# Patient Record
Sex: Female | Born: 1995 | Race: Black or African American | Hispanic: No | Marital: Single | Smoking: Former smoker
Health system: Southern US, Community
[De-identification: ages and names within clinical notes are randomized; demographics above are authoritative.]

## PROBLEM LIST (undated history)

## (undated) ENCOUNTER — Inpatient Hospital Stay (HOSPITAL_COMMUNITY): Payer: Self-pay

## (undated) DIAGNOSIS — A749 Chlamydial infection, unspecified: Secondary | ICD-10-CM

## (undated) DIAGNOSIS — R45851 Suicidal ideations: Secondary | ICD-10-CM

## (undated) DIAGNOSIS — N730 Acute parametritis and pelvic cellulitis: Secondary | ICD-10-CM

## (undated) DIAGNOSIS — F902 Attention-deficit hyperactivity disorder, combined type: Secondary | ICD-10-CM

## (undated) HISTORY — PX: NO PAST SURGERIES: SHX2092

---
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
## 2016-03-06 ENCOUNTER — Emergency Department (HOSPITAL_COMMUNITY)
Admission: EM | Admit: 2016-03-06 | Discharge: 2016-03-06 | Disposition: A | Discharge status: Home / Self Care | Payer: Medicaid Other | Attending: Emergency Medicine | Admitting: Emergency Medicine

## 2016-03-06 ENCOUNTER — Encounter (HOSPITAL_COMMUNITY): Payer: Self-pay

## 2016-03-06 DIAGNOSIS — Z3202 Encounter for pregnancy test, result negative: Secondary | ICD-10-CM | POA: Diagnosis not present

## 2016-03-06 DIAGNOSIS — Z8619 Personal history of other infectious and parasitic diseases: Secondary | ICD-10-CM | POA: Diagnosis not present

## 2016-03-06 DIAGNOSIS — F410 Panic disorder [episodic paroxysmal anxiety] without agoraphobia: Secondary | ICD-10-CM | POA: Insufficient documentation

## 2016-03-06 DIAGNOSIS — N390 Urinary tract infection, site not specified: Secondary | ICD-10-CM | POA: Diagnosis not present

## 2016-03-06 DIAGNOSIS — F17210 Nicotine dependence, cigarettes, uncomplicated: Secondary | ICD-10-CM | POA: Insufficient documentation

## 2016-03-06 DIAGNOSIS — F419 Anxiety disorder, unspecified: Secondary | ICD-10-CM

## 2016-03-06 DIAGNOSIS — R55 Syncope and collapse: Secondary | ICD-10-CM | POA: Diagnosis present

## 2016-03-06 LAB — CBC
HEMATOCRIT: 34.3 % — AB (ref 36.0–46.0)
Hemoglobin: 11.7 g/dL — ABNORMAL LOW (ref 12.0–15.0)
MCH: 28.3 pg (ref 26.0–34.0)
MCHC: 34.1 g/dL (ref 30.0–36.0)
MCV: 83.1 fL (ref 78.0–100.0)
Platelets: 190 10*3/uL (ref 150–400)
RBC: 4.13 MIL/uL (ref 3.87–5.11)
RDW: 12.2 % (ref 11.5–15.5)
WBC: 7.2 10*3/uL (ref 4.0–10.5)

## 2016-03-06 LAB — URINE MICROSCOPIC-ADD ON

## 2016-03-06 LAB — URINALYSIS, ROUTINE W REFLEX MICROSCOPIC
Bilirubin Urine: NEGATIVE
Glucose, UA: NEGATIVE mg/dL
Hgb urine dipstick: NEGATIVE
Ketones, ur: NEGATIVE mg/dL
NITRITE: POSITIVE — AB
PH: 7.5 (ref 5.0–8.0)
Protein, ur: NEGATIVE mg/dL
SPECIFIC GRAVITY, URINE: 1.023 (ref 1.005–1.030)

## 2016-03-06 LAB — BASIC METABOLIC PANEL
ANION GAP: 7 (ref 5–15)
BUN: 8 mg/dL (ref 6–20)
CHLORIDE: 107 mmol/L (ref 101–111)
CO2: 25 mmol/L (ref 22–32)
Calcium: 9.5 mg/dL (ref 8.9–10.3)
Creatinine, Ser: 0.84 mg/dL (ref 0.44–1.00)
GFR calc non Af Amer: >60 mL/min (ref >60)
Glucose, Bld: 81 mg/dL (ref 65–99)
POTASSIUM: 3.4 mmol/L — AB (ref 3.5–5.1)
SODIUM: 139 mmol/L (ref 135–145)

## 2016-03-06 LAB — I-STAT BETA HCG BLOOD, ED (MC, WL, AP ONLY): I-stat hCG, quantitative: <5 m[IU]/mL (ref <5)

## 2016-03-06 MED ORDER — CEPHALEXIN 500 MG PO CAPS: 500.0000 mg | ORAL_CAPSULE | Freq: Four times a day (QID) | ORAL | Status: DC

## 2016-03-06 NOTE — ED Notes (Signed)
Pt called to revitalize. Pt did not answer.

## 2016-03-06 NOTE — ED Provider Notes (Signed)
CSN: [id number]     Arrival date & time 03/06/16  1606 History   First MD Initiated Contact with Patient 03/06/16 2004     Chief Complaint  Patient presents with  . Near Syncope     (Consider location/radiation/quality/duration/timing/severity/associated sxs/prior Treatment) HPI Comments: Patient presents for evaluation of brief episode this morning of heart palpitations (felt like heart racing), and "felt faint" without syncope that last less than one minute. No nausea, vomiting, chest pain, fever, cough. She states symptoms were similar to panic attacks she has had in the past. She denies any further symptoms for the rest of her day and that coming in this evening was the soonest she could come in because of her job.   Patient is a 20 y.o. female presenting with near-syncope. The history is provided by the patient. No language interpreter was used.  Near Syncope Pertinent negatives include no chills or fever.    Past Medical History  Diagnosis Date  . Suicidal ideation   . ADHD (attention deficit hyperactivity disorder), combined type 07/14/2012  . Chlamydia    Past Surgical History  Procedure Laterality Date  . No past surgeries     No family history on file. Social History  Substance Use Topics  . Smoking status: Current Every Day Smoker -- 1.00 packs/day for .8 years    Types: Cigarettes    Last Attempt to Quit: 01/07/2011  . Smokeless tobacco: None  . Alcohol Use: No   OB History    Gravida Para Term Preterm AB TAB SAB Ectopic Multiple Living   2 2 1 1      2      Review of Systems  Constitutional: Negative for fever and chills.  HENT: Negative.   Respiratory: Negative.   Cardiovascular: Positive for palpitations and near-syncope.  Gastrointestinal: Negative.   Musculoskeletal: Negative.   Skin: Negative.   Neurological: Negative.       Allergies  Review of patient's allergies indicates no known allergies.  Home Medications   Prior to Admission  medications   Medication Sig Start Date End Date Taking? Authorizing Provider  ibuprofen (ADVIL,MOTRIN) 200 MG tablet Take 400 mg by mouth every 6 (six) hours as needed for headache or mild pain.   Yes Historical Provider, MD  Ibuprofen-Diphenhydramine Cit (ADVIL PM PO) Take 2 tablets by mouth at bedtime as needed (for sleep).   Yes Historical Provider, MD   BP 136/87 mmHg  Pulse 83  Temp(Src) 98.4 F (36.9 C) (Oral)  Resp 12  Wt 61.871 kg  SpO2 100% Physical Exam  Constitutional: She is oriented to person, place, and time. She appears well-developed and well-nourished.  HENT:  Head: Normocephalic.  Neck: Normal range of motion. Neck supple.  Cardiovascular: Normal rate and regular rhythm.   No murmur heard. Pulmonary/Chest: Effort normal and breath sounds normal. She has no wheezes. She has no rales.  Abdominal: Soft. Bowel sounds are normal. There is no tenderness. There is no rebound and no guarding.  Musculoskeletal: Normal range of motion.  Neurological: She is alert and oriented to person, place, and time.  Skin: Skin is warm and dry. No rash noted.  Psychiatric: She has a normal mood and affect.    ED Course  Procedures (including critical care time) Labs Review Labs Reviewed  BASIC METABOLIC PANEL - Abnormal; Notable for the following:    Potassium 3.4 (*)    All other components within normal limits  CBC - Abnormal; Notable for the following:  Hemoglobin 11.7 (*)    HCT 34.3 (*)    All other components within normal limits  URINALYSIS, ROUTINE W REFLEX MICROSCOPIC (NOT AT [facility]) - Abnormal; Notable for the following:    APPearance CLOUDY (*)    Nitrite POSITIVE (*)    Leukocytes, UA MODERATE (*)    All other components within normal limits  URINE MICROSCOPIC-ADD ON - Abnormal; Notable for the following:    Squamous Epithelial / LPF 6-30 (*)    Bacteria, UA MANY (*)    All other components within normal limits  I-STAT BETA HCG BLOOD, ED ([facility], [facility], [facility] ONLY)     Imaging Review No results found. I have personally reviewed and evaluated these images and lab results as part of my medical decision-making.   EKG Interpretation None      MDM   Final diagnoses:  None    1. Panic attack 2. UTI  Patient's lab studies show evidence of UTI. The patient endorses mild dysuria when asked but did not complain initially. Will place on Keflex for UTI.   No further symptoms of palpitations and lightheadedness throughout the day after initial episode this morning which "felt like previous panic attacks." No evidence infection, concern for cardiac involvement or PE. She is felt stable for discharge home. Encouraged return to [facility] for counseling.    Elpidio Anis, PA-C 03/06/16 2105  Rolan Bucco, MD 03/06/16 2219

## 2016-03-06 NOTE — Discharge Instructions (Signed)
Panic Attacks °Panic attacks are sudden, short-lived surges of severe anxiety, fear, or discomfort. They may occur for no reason when you are relaxed, when you are anxious, or when you are sleeping. Panic attacks may occur for a number of reasons:  °· Healthy people occasionally have panic attacks in extreme, life-threatening situations, such as war or natural disasters. Normal anxiety is a protective mechanism of the body that helps us react to danger (fight or flight response). °· Panic attacks are often seen with anxiety disorders, such as panic disorder, social anxiety disorder, generalized anxiety disorder, and phobias. Anxiety disorders cause excessive or uncontrollable anxiety. They may interfere with your relationships or other life activities. °· Panic attacks are sometimes seen with other mental illnesses, such as depression and posttraumatic stress disorder. °· Certain medical conditions, prescription medicines, and drugs of abuse can cause panic attacks. °SYMPTOMS  °Panic attacks start suddenly, peak within 20 minutes, and are accompanied by four or more of the following symptoms: °· Pounding heart or fast heart rate (palpitations). °· Sweating. °· Trembling or shaking. °· Shortness of breath or feeling smothered. °· Feeling choked. °· Chest pain or discomfort. °· Nausea or strange feeling in your stomach. °· Dizziness, light-headedness, or feeling like you will faint. °· Chills or hot flushes. °· Numbness or tingling in your lips or hands and feet. °· Feeling that things are not real or feeling that you are not yourself. °· Fear of losing control or going crazy. °· Fear of dying. °Some of these symptoms can mimic serious medical conditions. For example, you may think you are having a heart attack. Although panic attacks can be very scary, they are not life threatening. °DIAGNOSIS  °Panic attacks are diagnosed through an assessment by your health care provider. Your health care provider will ask  questions about your symptoms, such as where and when they occurred. Your health care provider will also ask about your medical history and use of alcohol and drugs, including prescription medicines. Your health care provider may order blood tests or other studies to rule out a serious medical condition. Your health care provider may refer you to a mental health professional for further evaluation. °TREATMENT  °· Most healthy people who have one or two panic attacks in an extreme, life-threatening situation will not require treatment. °· The treatment for panic attacks associated with anxiety disorders or other mental illness typically involves counseling with a mental health professional, medicine, or a combination of both. Your health care provider will help determine what treatment is best for you. °· Panic attacks due to physical illness usually go away with treatment of the illness. If prescription medicine is causing panic attacks, talk with your health care provider about stopping the medicine, decreasing the dose, or substituting another medicine. °· Panic attacks due to alcohol or drug abuse go away with abstinence. Some adults need professional help in order to stop drinking or using drugs. °HOME CARE INSTRUCTIONS  °· Take all medicines as directed by your health care provider.   °· Schedule and attend follow-up visits as directed by your health care provider. It is important to keep all your appointments. °SEEK MEDICAL CARE IF: °· You are not able to take your medicines as prescribed. °· Your symptoms do not improve or get worse. °SEEK IMMEDIATE MEDICAL CARE IF:  °· You experience panic attack symptoms that are different than your usual symptoms. °· You have serious thoughts about hurting yourself or others. °· You are taking medicine for panic attacks and   have a serious side effect. °MAKE SURE YOU: °· Understand these instructions. °· Will watch your condition. °· Will get help right away if you are not  doing well or get worse. °  °This information is not intended to replace advice given to you by your health care provider. Make sure you discuss any questions you have with your health care provider. °  °Document Released: 09/17/2005 Document Revised: 09/22/2013 Document Reviewed: 05/01/2013 °Elsevier Interactive Patient Education ©2016 Elsevier Inc[employer] ° °Urinary Tract Infection °Urinary tract infections (UTIs) can develop anywhere along your urinary tract. Your urinary tract is your body's drainage system for removing wastes and extra water. Your urinary tract includes two kidneys, two ureters, a bladder, and a urethra. Your kidneys are a pair of bean-shaped organs. Each kidney is about the size of your fist. They are located below your ribs, one on each side of your spine. °CAUSES °Infections are caused by microbes, which are microscopic organisms, including fungi, viruses, and bacteria. These organisms are so small that they can only be seen through a microscope. Bacteria are the microbes that most commonly cause UTIs. °SYMPTOMS  °Symptoms of UTIs may vary by age and gender of the patient and by the location of the infection. Symptoms in young women typically include a frequent and intense urge to urinate and a painful, burning feeling in the bladder or urethra during urination. Older women and men are more likely to be tired, shaky, and weak and have muscle aches and abdominal pain. A fever may mean the infection is in your kidneys. Other symptoms of a kidney infection include pain in your back or sides below the ribs, nausea, and vomiting. °DIAGNOSIS °To diagnose a UTI, your caregiver will ask you about your symptoms. Your caregiver will also ask you to provide a urine sample. The urine sample will be tested for bacteria and white blood cells. White blood cells are made by your body to help fight infection. °TREATMENT  °Typically, UTIs can be treated with medication. Because most UTIs are caused by a bacterial  infection, they usually can be treated with the use of antibiotics. The choice of antibiotic and length of treatment depend on your symptoms and the type of bacteria causing your infection. °HOME CARE INSTRUCTIONS °· If you were prescribed antibiotics, take them exactly as your caregiver instructs you. Finish the medication even if you feel better after you have only taken some of the medication. °· Drink enough water and fluids to keep your urine clear or pale yellow. °· Avoid caffeine, tea, and carbonated beverages. They tend to irritate your bladder. °· Empty your bladder often. Avoid holding urine for long periods of time. °· Empty your bladder before and after sexual intercourse. °· After a bowel movement, women should cleanse from front to back. Use each tissue only once. °SEEK MEDICAL CARE IF:  °· You have back pain. °· You develop a fever. °· Your symptoms do not begin to resolve within 3 days. °SEEK IMMEDIATE MEDICAL CARE IF:  °· You have severe back pain or lower abdominal pain. °· You develop chills. °· You have nausea or vomiting. °· You have continued burning or discomfort with urination. °MAKE SURE YOU:  °· Understand these instructions. °· Will watch your condition. °· Will get help right away if you are not doing well or get worse. °  °This information is not intended to replace advice given to you by your health care provider. Make sure you discuss any questions you have with   your health care provider. °  °Document Released: 06/27/2005 Document Revised: 06/08/2015 Document Reviewed: 10/26/2011 °Elsevier Interactive Patient Education ©2016 Elsevier Inc. ° °

## 2016-06-27 ENCOUNTER — Encounter: Payer: Self-pay | Admitting: *Deleted

## 2016-08-01 ENCOUNTER — Emergency Department (HOSPITAL_COMMUNITY)
Admission: EM | Admit: 2016-08-01 | Discharge: 2016-08-02 | Disposition: A | Discharge status: Home / Self Care | Payer: Medicaid Other | Attending: Emergency Medicine | Admitting: Emergency Medicine

## 2016-08-01 ENCOUNTER — Encounter (HOSPITAL_COMMUNITY): Payer: Self-pay | Admitting: Emergency Medicine

## 2016-08-01 ENCOUNTER — Emergency Department (HOSPITAL_COMMUNITY)
Admission: EM | Admit: 2016-08-01 | Discharge: 2016-08-01 | Disposition: A | Discharge status: Home / Self Care | Payer: Medicaid Other | Source: Home / Self Care | Attending: Emergency Medicine | Admitting: Emergency Medicine

## 2016-08-01 ENCOUNTER — Encounter (HOSPITAL_COMMUNITY): Payer: Self-pay | Admitting: *Deleted

## 2016-08-01 DIAGNOSIS — T24011A Burn of unspecified degree of right thigh, initial encounter: Secondary | ICD-10-CM

## 2016-08-01 DIAGNOSIS — F909 Attention-deficit hyperactivity disorder, unspecified type: Secondary | ICD-10-CM | POA: Insufficient documentation

## 2016-08-01 DIAGNOSIS — N898 Other specified noninflammatory disorders of vagina: Secondary | ICD-10-CM | POA: Diagnosis present

## 2016-08-01 DIAGNOSIS — Z23 Encounter for immunization: Secondary | ICD-10-CM

## 2016-08-01 DIAGNOSIS — Y929 Unspecified place or not applicable: Secondary | ICD-10-CM

## 2016-08-01 DIAGNOSIS — B373 Candidiasis of vulva and vagina: Secondary | ICD-10-CM | POA: Diagnosis not present

## 2016-08-01 DIAGNOSIS — F17210 Nicotine dependence, cigarettes, uncomplicated: Secondary | ICD-10-CM | POA: Insufficient documentation

## 2016-08-01 DIAGNOSIS — B3731 Acute candidiasis of vulva and vagina: Secondary | ICD-10-CM

## 2016-08-01 DIAGNOSIS — X19XXXA Contact with other heat and hot substances, initial encounter: Secondary | ICD-10-CM | POA: Insufficient documentation

## 2016-08-01 DIAGNOSIS — Y939 Activity, unspecified: Secondary | ICD-10-CM | POA: Insufficient documentation

## 2016-08-01 DIAGNOSIS — T300 Burn of unspecified body region, unspecified degree: Secondary | ICD-10-CM

## 2016-08-01 DIAGNOSIS — Y999 Unspecified external cause status: Secondary | ICD-10-CM | POA: Insufficient documentation

## 2016-08-01 LAB — URINALYSIS, ROUTINE W REFLEX MICROSCOPIC
Bilirubin Urine: NEGATIVE
Glucose, UA: NEGATIVE mg/dL
Hgb urine dipstick: NEGATIVE
Ketones, ur: NEGATIVE mg/dL
LEUKOCYTES UA: NEGATIVE
NITRITE: POSITIVE — AB
PH: 6 (ref 5.0–8.0)
Protein, ur: NEGATIVE mg/dL
SPECIFIC GRAVITY, URINE: 1.031 — AB (ref 1.005–1.030)

## 2016-08-01 LAB — URINE MICROSCOPIC-ADD ON: RBC / HPF: NONE SEEN RBC/hpf (ref 0–5)

## 2016-08-01 LAB — POC URINE PREG, ED: Preg Test, Ur: NEGATIVE

## 2016-08-01 MED ORDER — TETANUS-DIPHTH-ACELL PERTUSSIS 5-2.5-18.5 LF-MCG/0.5 IM SUSP
0.5000 mL | Freq: Once | INTRAMUSCULAR | Status: AC
  Administered 2016-08-01: 0.5 mL via INTRAMUSCULAR
  Filled 2016-08-01: qty 0.5

## 2016-08-01 NOTE — ED Provider Notes (Signed)
[facility]-EMERGENCY DEPT Provider Note   CSN: [id number] Arrival date & time: 08/01/16  1820  History   Chief Complaint Chief Complaint  Patient presents with  . Abdominal Pain    HPI Amanda Russell is a 20 y.o. female.  HPI   This patient has a PMH of ADHD, chlamydia, depression, SI, oppositional defiant disorder comes to the ED with complaints of vaginal discharge and crampy low abdominal pain. [facility] to her [facility], discharged and [facility] straight [facility] for vaginal discharge and belly pain. She has been having her symptoms for two weeks. She denies having any low back pain, fevers, N/V/D, weakness, dysuria, vaginal bleeding.  Past Medical History:  Diagnosis Date  . ADHD (attention deficit hyperactivity disorder), combined type 07/14/2012  . Chlamydia   . Suicidal ideation     Patient Active Problem List   Diagnosis Date Noted  . Preterm premature rupture of membranes (PPROM) with unknown onset of labor 07/13/2014  . NVD (normal vaginal delivery) 07/13/2014  . Suicidal ideation 07/14/2012  . Depression 07/14/2012  . ADHD (attention deficit hyperactivity disorder), combined type 07/14/2012  . Oppositional defiant disorder 07/14/2012  . Parent-child relational problem 07/14/2012    Past Surgical History:  Procedure Laterality Date  . NO PAST SURGERIES     OB History    Gravida Para Term Preterm AB Living   2 2 1 1   2    SAB TAB Ectopic Multiple Live Births           2     Home Medications    Prior to Admission medications   Medication Sig Start Date End Date Taking? Authorizing Provider  cephALEXin (KEFLEX) 500 MG capsule Take 1 capsule (500 mg total) by mouth 4 (four) times daily. Patient not taking: Reported on 08/01/2016 03/06/16   Elpidio AnisShari Upstill, PA-C  fluconazole (DIFLUCAN) 200 MG tablet Take 1 tablet (200 mg total) by mouth daily. 08/02/16 08/09/16  Marlon Peliffany Tammatha Cobb, PA-C    Family History History reviewed. No pertinent family  history.  Social History Social History  Substance Use Topics  . Smoking status: Current Every Day Smoker    Packs/day: 1.00    Years: 0.80    Types: Cigarettes    Last attempt to quit: 01/07/2011  . Smokeless tobacco: Never Used  . Alcohol use No   Allergies   Review of patient's allergies indicates no known allergies.  Review of Systems Review of Systems  Review of Systems All other systems negative except as documented in the HPI. All pertinent positives and negatives as reviewed in the HPI.  Physical Exam Updated Vital Signs BP 118/71 (BP Location: Right Arm)   Pulse 72   Temp 98.6 F (37 C) (Oral)   Resp 20   Ht 5\' 5"  (1.651 m)   Wt 58.5 kg   LMP 07/21/2016   SpO2 99%   BMI 21.47 kg/m   Physical Exam  Constitutional: She appears well-developed and well-nourished. No distress.  HENT:  Head: Normocephalic and atraumatic.  Nose: Nose normal.  Mouth/Throat: Uvula is midline, oropharynx is clear and moist and mucous membranes are normal.  Eyes: Pupils are equal, round, and reactive to light.  Neck: Normal range of motion. Neck supple.  Cardiovascular: Normal rate and regular rhythm.   Pulmonary/Chest: Effort normal.  Abdominal: Soft. Bowel sounds are normal. There is tenderness in the suprapubic area. There is no rigidity, no rebound, no guarding, no CVA tenderness and  negative Murphy's sign.  No signs of abdominal distention  Genitourinary: There is no rash or tenderness on the right labia. There is no rash or tenderness on the left labia. Cervix exhibits no motion tenderness and no discharge. Right adnexum displays no mass and no tenderness. Left adnexum displays no mass and no tenderness. Vaginal discharge (white) found.  Neurological: She is alert.  Acting at baseline  Skin: Skin is warm and dry. No rash noted.  Nursing note and vitals reviewed.   ED Treatments / Results  Labs (all labs ordered are listed, but only abnormal results are displayed) Labs  Reviewed  WET PREP, GENITAL - Abnormal; Notable for the following:       Result Value   Clue Cells Wet Prep HPF POC PRESENT (*)    WBC, Wet Prep HPF POC MODERATE (*)    All other components within normal limits  URINALYSIS, ROUTINE W REFLEX MICROSCOPIC (NOT AT [facility]) - Abnormal; Notable for the following:    Color, Urine AMBER (*)    APPearance CLOUDY (*)    Specific Gravity, Urine 1.031 (*)    Nitrite POSITIVE (*)    All other components within normal limits  URINE MICROSCOPIC-ADD ON - Abnormal; Notable for the following:    Squamous Epithelial / LPF 6-30 (*)    Bacteria, UA MANY (*)    Crystals CA OXALATE CRYSTALS (*)    All other components within normal limits  POC URINE PREG, ED  GC/CHLAMYDIA PROBE AMP ([place]) NOT AT [facility]    EKG  EKG Interpretation None       Radiology No results found.  Procedures Procedures (including critical care time)  Medications Ordered in ED Medications  azithromycin (ZITHROMAX) tablet 1,000 mg (not administered)  cefTRIAXone (ROCEPHIN) injection 250 mg (not administered)  fluconazole (DIFLUCAN) tablet 200 mg (not administered)  ondansetron (ZOFRAN-ODT) disintegrating tablet 4 mg (not administered)  ibuprofen (ADVIL,MOTRIN) tablet 800 mg (800 mg Oral Given 08/02/16 0151)     Initial Impression / Assessment and Plan / ED Course  I have reviewed the triage vital signs and the nursing notes.  Pertinent labs & imaging results that were available during my care of the patient were reviewed by me and considered in my medical decision making (see chart for details).  Clinical Course    WBC and yeast on wet prep. GC pending. Refer to [facility] outpatient clinic, sx no consistent with PID. Pt well appearing. Discussed hygiene. rx Diflucan.   I discussed results, diagnoses and plan with Amanda PoliSabrina C Russell. They voice there understanding and questions were answered. We discussed follow-up recommendations and return precautions.   Final  Clinical Impressions(s) / ED Diagnoses   Final diagnoses:  Vaginal yeast infection    New Prescriptions New Prescriptions   FLUCONAZOLE (DIFLUCAN) 200 MG TABLET    Take 1 tablet (200 mg total) by mouth daily.     Marlon Peliffany Emilya Justen, PA-C 08/02/16 0335    Shaune Pollackameron Isaacs, MD 08/02/16 [phone]

## 2016-08-01 NOTE — Discharge Instructions (Signed)
Please continue using topical antibiotic cream. He may use Motrin for discomfort. Follow-up with your doctor for reevaluation. Return to ED for new or worsening symptoms as we discussed
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
## 2016-08-01 NOTE — ED Triage Notes (Signed)
Pt reports having lower abd pain and vaginal discharge/itching x 2 weeks. Thinks its from "using old douche." no acute distress noted at triage.

## 2016-08-01 NOTE — Progress Notes (Signed)
ED CM consulted by ED RN, Landis GandyIkrame for resources for pt - RN stated pt with various complaint during triage and RN assessed her to confirm the main complaints ED RN attempt to d/c pt after addressing her foot and chest pain complaints and she is complaining of other issues - abdominal Pt listed with medicaid of [place]  RN and Cm went to speak with pt who is noted to be on her bed crying with a toddler lying next to her sleeping CM and RN inquired about pt crying and she reports she is just hurting and "I'm going somewhere else."   Cm inquired if pt had a pcp She states Dr Wilburt FinlayB Marshall was her pcp "until he left"   Cm reviewed with pt a list of medicaid [place] providers that can see her as a medicaid pt Cm noted a few that may be able to see her today or tomorrow pending availability Pt given the address and contact information to DSS, Health dept, for [place] [facility] and [facility].   Pt with her cell phone in her hand and less tearful Cm reviewed ED services for triage, emergent care ans stabilization of emergent needs and differences in ED and pcp/community medical care services for general medical care  Pt informed ED RN she would begin her menses soon

## 2016-08-01 NOTE — ED Notes (Signed)
Went in Amanda Russell's room in order to discharge Amanda Russell. Amanda Russell reports abd pain and wants to be checked for it, the writer explained that Amanda Russell was seen and treated for the symptoms she reported upon triage. Amanda Russell did not report any abd apin to the Clinical research associatewriter during triage ,nor she did to [place], [place].  Amanda Russell reports that her period should be starting anytime soon. Selena Batten.Kim case manger is consulted , Amanda Russell is given resources for PCP in the area, Amanda Russell was also provided phone number and address for the health department for follow up on vaccination she needs since Amanda Russell requested flu shot .  Amanda Russell in tears and stated that she will go to urgent care for all the issues she is having. Amanda Russell is educated, again, about the appropriate use of the resources provided. Ben, [place] made aware and Amanda Russell is safe to be discharged per his order.
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
## 2016-08-01 NOTE — ED Notes (Signed)
Pt c/o lower abd pain x's 2 weeks.

## 2016-08-02 ENCOUNTER — Encounter (HOSPITAL_COMMUNITY): Payer: Self-pay | Admitting: *Deleted

## 2016-08-02 ENCOUNTER — Emergency Department (HOSPITAL_COMMUNITY)
Admission: EM | Admit: 2016-08-02 | Discharge: 2016-08-02 | Disposition: A | Discharge status: Home / Self Care | Payer: Medicaid Other | Source: Home / Self Care | Attending: Emergency Medicine | Admitting: Emergency Medicine

## 2016-08-02 DIAGNOSIS — F1290 Cannabis use, unspecified, uncomplicated: Secondary | ICD-10-CM | POA: Insufficient documentation

## 2016-08-02 DIAGNOSIS — Z79899 Other long term (current) drug therapy: Secondary | ICD-10-CM | POA: Insufficient documentation

## 2016-08-02 DIAGNOSIS — F329 Major depressive disorder, single episode, unspecified: Secondary | ICD-10-CM | POA: Insufficient documentation

## 2016-08-02 DIAGNOSIS — F902 Attention-deficit hyperactivity disorder, combined type: Secondary | ICD-10-CM

## 2016-08-02 DIAGNOSIS — T7421XA Adult sexual abuse, confirmed, initial encounter: Secondary | ICD-10-CM | POA: Insufficient documentation

## 2016-08-02 DIAGNOSIS — F17210 Nicotine dependence, cigarettes, uncomplicated: Secondary | ICD-10-CM

## 2016-08-02 LAB — URINE MICROSCOPIC-ADD ON
Bacteria, UA: NONE SEEN
RBC / HPF: NONE SEEN RBC/hpf (ref 0–5)
WBC, UA: NONE SEEN WBC/hpf (ref 0–5)

## 2016-08-02 LAB — GC/CHLAMYDIA PROBE AMP (~~LOC~~) NOT AT ARMC
CHLAMYDIA, DNA PROBE: NEGATIVE
Neisseria Gonorrhea: NEGATIVE

## 2016-08-02 LAB — WET PREP, GENITAL
Sperm: NONE SEEN
TRICH WET PREP: NONE SEEN
Yeast Wet Prep HPF POC: NONE SEEN

## 2016-08-02 LAB — URINALYSIS, ROUTINE W REFLEX MICROSCOPIC
BILIRUBIN URINE: NEGATIVE
GLUCOSE, UA: NEGATIVE mg/dL
HGB URINE DIPSTICK: NEGATIVE
Ketones, ur: >80 mg/dL — AB
Leukocytes, UA: NEGATIVE
Nitrite: NEGATIVE
Protein, ur: NEGATIVE mg/dL
SPECIFIC GRAVITY, URINE: 1.026 (ref 1.005–1.030)
pH: 6 (ref 5.0–8.0)

## 2016-08-02 MED ORDER — FLUCONAZOLE 200 MG PO TABS: 200.0000 mg | ORAL_TABLET | Freq: Every day | ORAL | 0 refills | Status: AC

## 2016-08-02 MED ORDER — ONDANSETRON 4 MG PO TBDP
4.0000 mg | ORAL_TABLET | Freq: Once | ORAL | Status: AC
  Administered 2016-08-02: 4 mg via ORAL
  Filled 2016-08-02: qty 1

## 2016-08-02 MED ORDER — METRONIDAZOLE 500 MG PO TABS: 500.0000 mg | ORAL_TABLET | Freq: Two times a day (BID) | ORAL | 0 refills | Status: DC

## 2016-08-02 MED ORDER — IBUPROFEN 800 MG PO TABS
800.0000 mg | ORAL_TABLET | Freq: Once | ORAL | Status: AC
  Administered 2016-08-02: 800 mg via ORAL
  Filled 2016-08-02: qty 1

## 2016-08-02 MED ORDER — CEPHALEXIN 500 MG PO CAPS
500.0000 mg | ORAL_CAPSULE | Freq: Once | ORAL | Status: AC
  Administered 2016-08-02: 500 mg via ORAL
  Filled 2016-08-02: qty 1

## 2016-08-02 MED ORDER — AZITHROMYCIN 250 MG PO TABS
1000.0000 mg | ORAL_TABLET | Freq: Once | ORAL | Status: AC
  Administered 2016-08-02: 1000 mg via ORAL
  Filled 2016-08-02: qty 4

## 2016-08-02 MED ORDER — METRONIDAZOLE 500 MG PO TABS
500.0000 mg | ORAL_TABLET | Freq: Once | ORAL | Status: AC
  Administered 2016-08-02: 500 mg via ORAL
  Filled 2016-08-02: qty 1

## 2016-08-02 MED ORDER — CEFTRIAXONE SODIUM 250 MG IJ SOLR
250.0000 mg | Freq: Once | INTRAMUSCULAR | Status: AC
  Administered 2016-08-02: 250 mg via INTRAMUSCULAR
  Filled 2016-08-02: qty 250

## 2016-08-02 MED ORDER — FLUCONAZOLE 100 MG PO TABS
200.0000 mg | ORAL_TABLET | Freq: Once | ORAL | Status: AC
  Administered 2016-08-02: 200 mg via ORAL
  Filled 2016-08-02: qty 2
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
## 2016-08-02 NOTE — ED Notes (Signed)
[employer] at bedside 

## 2016-08-02 NOTE — ED Notes (Addendum)
Pt aware that a urine sample is needed. Pt sts she is unable at this time because she previously went but was unaware a sample was needed at that time.

## 2016-08-02 NOTE — ED Notes (Signed)
Patient is resting comfortably. 

## 2016-08-02 NOTE — Discharge Instructions (Signed)
Please follow up with a counselor Continue taking Keflex for UTI symptoms.  Take Flagyl to help with discharge symptoms
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
## 2016-08-02 NOTE — ED Notes (Addendum)
Pt states she has anxiety and panic attacks, Pt is asking if her hands and feet looks ok. Pt is moving feet and hands around, does not see any deformaties, skin is intact.

## 2016-08-03 ENCOUNTER — Inpatient Hospital Stay (HOSPITAL_COMMUNITY)
Admission: AD | Admit: 2016-08-03 | Discharge: 2016-08-03 | Discharge status: Against Medical Advice | Payer: Medicaid Other | Source: Ambulatory Visit | Attending: Family Medicine | Admitting: Family Medicine

## 2016-08-03 ENCOUNTER — Encounter (HOSPITAL_COMMUNITY): Payer: Self-pay | Admitting: *Deleted

## 2016-08-03 ENCOUNTER — Inpatient Hospital Stay (HOSPITAL_COMMUNITY)
Admission: AD | Admit: 2016-08-03 | Discharge: 2016-08-03 | Discharge status: Against Medical Advice | Payer: Medicaid Other | Source: Ambulatory Visit | Attending: Obstetrics and Gynecology | Admitting: Obstetrics and Gynecology

## 2016-08-03 ENCOUNTER — Encounter (HOSPITAL_COMMUNITY): Payer: Self-pay

## 2016-08-03 ENCOUNTER — Ambulatory Visit (INDEPENDENT_AMBULATORY_CARE_PROVIDER_SITE_OTHER): Payer: Medicaid Other

## 2016-08-03 DIAGNOSIS — Z202 Contact with and (suspected) exposure to infections with a predominantly sexual mode of transmission: Secondary | ICD-10-CM

## 2016-08-03 DIAGNOSIS — M79604 Pain in right leg: Secondary | ICD-10-CM | POA: Diagnosis not present

## 2016-08-03 DIAGNOSIS — M79606 Pain in leg, unspecified: Secondary | ICD-10-CM | POA: Insufficient documentation

## 2016-08-03 DIAGNOSIS — R109 Unspecified abdominal pain: Secondary | ICD-10-CM | POA: Diagnosis present

## 2016-08-03 DIAGNOSIS — Z5321 Procedure and treatment not carried out due to patient leaving prior to being seen by health care provider: Secondary | ICD-10-CM | POA: Insufficient documentation

## 2016-08-03 DIAGNOSIS — Z79899 Other long term (current) drug therapy: Secondary | ICD-10-CM | POA: Diagnosis not present

## 2016-08-03 DIAGNOSIS — F909 Attention-deficit hyperactivity disorder, unspecified type: Secondary | ICD-10-CM | POA: Insufficient documentation

## 2016-08-03 DIAGNOSIS — F22 Delusional disorders: Secondary | ICD-10-CM | POA: Diagnosis not present

## 2016-08-03 DIAGNOSIS — Z3202 Encounter for pregnancy test, result negative: Secondary | ICD-10-CM | POA: Diagnosis not present

## 2016-08-03 DIAGNOSIS — F17210 Nicotine dependence, cigarettes, uncomplicated: Secondary | ICD-10-CM | POA: Insufficient documentation

## 2016-08-03 DIAGNOSIS — Z3200 Encounter for pregnancy test, result unknown: Secondary | ICD-10-CM

## 2016-08-03 LAB — URINALYSIS, ROUTINE W REFLEX MICROSCOPIC
Bilirubin Urine: NEGATIVE
Glucose, UA: NEGATIVE mg/dL
Hgb urine dipstick: NEGATIVE
KETONES UR: 40 mg/dL — AB
LEUKOCYTES UA: NEGATIVE
NITRITE: NEGATIVE
PH: 5.5 (ref 5.0–8.0)
Protein, ur: NEGATIVE mg/dL
SPECIFIC GRAVITY, URINE: 1.015 (ref 1.005–1.030)

## 2016-08-03 LAB — RAPID URINE DRUG SCREEN, HOSP PERFORMED
Amphetamines: NOT DETECTED
BARBITURATES: NOT DETECTED
BENZODIAZEPINES: NOT DETECTED
COCAINE: NOT DETECTED
OPIATES: NOT DETECTED
Tetrahydrocannabinol: POSITIVE — AB

## 2016-08-03 LAB — POCT PREGNANCY, URINE
PREG TEST UR: NEGATIVE
Preg Test, Ur: NEGATIVE

## 2016-08-03 LAB — HEPATITIS B SURFACE ANTIGEN: Hepatitis B Surface Ag: NEGATIVE
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
## 2016-08-03 NOTE — Progress Notes (Signed)
Patient presented to office today for pregnancy test. Patient was very tearful when she walk into the  office today. She stated she had not eaten in two days and has been very upset. I ask if she wanted to share anything and she put her head down. UPT was negative after given patient the news. She complain of low abdominal pain and was concern she may has AIDS. After discussing  her symptoms with director patient was work up to MAU to be access by a Publishing rights managerANE nurse.
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
## 2016-08-03 NOTE — MAU Note (Signed)
Pt brought back to restroom for urine stating that she wants blood drawn for a pregnancy test. Explained to pt that the provider would have to order labs. Pt left stating that she did not want to have her urine test run again

## 2016-08-03 NOTE — ED Triage Notes (Signed)
The pt came in from outside loudly making a crying noise but making no tears.  Pulling up her shirt saying her kidney was coming out  Saying that she was hurting all over her body  And she has been pooping and peeing today.  She reports that [facility].  She just keep talking about her kidneys.  lmp unknown pt not answering

## 2016-08-04 ENCOUNTER — Emergency Department (HOSPITAL_COMMUNITY)
Admission: EM | Admit: 2016-08-04 | Discharge: 2016-08-04 | Discharge status: Against Medical Advice | Payer: Medicaid Other | Attending: Emergency Medicine | Admitting: Emergency Medicine

## 2016-08-04 DIAGNOSIS — F22 Delusional disorders: Secondary | ICD-10-CM

## 2016-08-04 LAB — CBC WITH DIFFERENTIAL/PLATELET
BASOS ABS: 0 10*3/uL (ref 0.0–0.1)
Basophils Relative: 0 %
Eosinophils Absolute: 0 10*3/uL (ref 0.0–0.7)
Eosinophils Relative: 0 %
HEMATOCRIT: 34.2 % — AB (ref 36.0–46.0)
HEMOGLOBIN: 12 g/dL (ref 12.0–15.0)
LYMPHS PCT: 29 %
Lymphs Abs: 2.3 10*3/uL (ref 0.7–4.0)
MCH: 29 pg (ref 26.0–34.0)
MCHC: 35.1 g/dL (ref 30.0–36.0)
MCV: 82.6 fL (ref 78.0–100.0)
MONO ABS: 0.7 10*3/uL (ref 0.1–1.0)
MONOS PCT: 9 %
NEUTROS ABS: 4.9 10*3/uL (ref 1.7–7.7)
NEUTROS PCT: 62 %
Platelets: 205 10*3/uL (ref 150–400)
RBC: 4.14 MIL/uL (ref 3.87–5.11)
RDW: 12.1 % (ref 11.5–15.5)
WBC: 8 10*3/uL (ref 4.0–10.5)

## 2016-08-04 LAB — URINE MICROSCOPIC-ADD ON

## 2016-08-04 LAB — COMPREHENSIVE METABOLIC PANEL
ALBUMIN: 4 g/dL (ref 3.5–5.0)
ALK PHOS: 58 U/L (ref 38–126)
ALT: 15 U/L (ref 14–54)
AST: 16 U/L (ref 15–41)
Anion gap: 8 (ref 5–15)
BILIRUBIN TOTAL: 0.5 mg/dL (ref 0.3–1.2)
CALCIUM: 9.1 mg/dL (ref 8.9–10.3)
CO2: 26 mmol/L (ref 22–32)
CREATININE: 0.89 mg/dL (ref 0.44–1.00)
Chloride: 103 mmol/L (ref 101–111)
GFR calc Af Amer: >60 mL/min (ref >60)
GLUCOSE: 99 mg/dL (ref 65–99)
POTASSIUM: 3.3 mmol/L — AB (ref 3.5–5.1)
Sodium: 137 mmol/L (ref 135–145)
TOTAL PROTEIN: 6.9 g/dL (ref 6.5–8.1)

## 2016-08-04 LAB — URINALYSIS, ROUTINE W REFLEX MICROSCOPIC
Bilirubin Urine: NEGATIVE
Glucose, UA: NEGATIVE mg/dL
Hgb urine dipstick: NEGATIVE
KETONES UR: NEGATIVE mg/dL
NITRITE: NEGATIVE
PH: 6 (ref 5.0–8.0)
Protein, ur: NEGATIVE mg/dL
SPECIFIC GRAVITY, URINE: 1.003 — AB (ref 1.005–1.030)

## 2016-08-04 LAB — RAPID URINE DRUG SCREEN, HOSP PERFORMED
AMPHETAMINES: NOT DETECTED
Barbiturates: NOT DETECTED
Benzodiazepines: NOT DETECTED
Cocaine: NOT DETECTED
OPIATES: NOT DETECTED
Tetrahydrocannabinol: POSITIVE — AB

## 2016-08-04 LAB — ETHANOL

## 2016-08-04 LAB — HIV ANTIBODY (ROUTINE TESTING W REFLEX): HIV SCREEN 4TH GENERATION: NONREACTIVE

## 2016-08-04 LAB — RPR: RPR Ser Ql: NONREACTIVE

## 2016-08-04 LAB — POC URINE PREG, ED: Preg Test, Ur: NEGATIVE

## 2016-08-04 MED ORDER — IBUPROFEN 400 MG PO TABS
600.0000 mg | ORAL_TABLET | Freq: Three times a day (TID) | ORAL | Status: DC | PRN
  Administered 2016-08-04: 600 mg via ORAL
  Filled 2016-08-04: qty 1

## 2016-08-04 MED ORDER — ALUM & MAG HYDROXIDE-SIMETH 200-200-20 MG/5ML PO SUSP: 30.0000 mL | ORAL | Status: DC | PRN

## 2016-08-04 MED ORDER — ZIPRASIDONE MESYLATE 20 MG IM SOLR: 10.0000 mg | Freq: Once | INTRAMUSCULAR | Status: DC

## 2016-08-04 MED ORDER — NICOTINE 21 MG/24HR TD PT24: 21.0000 mg | MEDICATED_PATCH | Freq: Once | TRANSDERMAL | Status: DC

## 2016-08-04 MED ORDER — LORAZEPAM 1 MG PO TABS
1.0000 mg | ORAL_TABLET | Freq: Three times a day (TID) | ORAL | Status: DC | PRN
  Administered 2016-08-04: 1 mg via ORAL
  Filled 2016-08-04: qty 1

## 2016-08-04 MED ORDER — ONDANSETRON HCL 4 MG PO TABS: 4.0000 mg | ORAL_TABLET | Freq: Three times a day (TID) | ORAL | Status: DC | PRN

## 2016-08-04 MED ORDER — ACETAMINOPHEN 325 MG PO TABS: 650.0000 mg | ORAL_TABLET | ORAL | Status: DC | PRN

## 2016-08-04 NOTE — ED Notes (Signed)
Very anxious yelling out at this time.  Denies being suicidal.  States I got to get out of here and go home to my babies.  Leaving AMA at this time. Dr. Blinda LeatherwoodPollina aware.
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
## 2016-08-04 NOTE — ED Notes (Signed)
Pt has come up to Nurse 1st desk several times to voice concern about her "kidneys" hurting. Pt moaning and crying loadly in waiting room. Security has also been involved.

## 2016-08-04 NOTE — ED Notes (Signed)
TTS in progress now.  

## 2016-08-04 NOTE — ED Notes (Signed)
TTS machine a the bedside.

## 2016-08-05 ENCOUNTER — Emergency Department (HOSPITAL_COMMUNITY)
Admission: EM | Admit: 2016-08-05 | Discharge: 2016-08-06 | Disposition: A | Discharge status: Other Acute Inpatient Hospital | Payer: Medicaid Other | Attending: Physician Assistant | Admitting: Physician Assistant

## 2016-08-05 ENCOUNTER — Encounter (HOSPITAL_COMMUNITY): Payer: Self-pay | Admitting: Emergency Medicine

## 2016-08-05 DIAGNOSIS — Z808 Family history of malignant neoplasm of other organs or systems: Secondary | ICD-10-CM | POA: Diagnosis not present

## 2016-08-05 DIAGNOSIS — F319 Bipolar disorder, unspecified: Secondary | ICD-10-CM | POA: Insufficient documentation

## 2016-08-05 DIAGNOSIS — F17210 Nicotine dependence, cigarettes, uncomplicated: Secondary | ICD-10-CM | POA: Diagnosis not present

## 2016-08-05 DIAGNOSIS — F909 Attention-deficit hyperactivity disorder, unspecified type: Secondary | ICD-10-CM | POA: Insufficient documentation

## 2016-08-05 DIAGNOSIS — F22 Delusional disorders: Secondary | ICD-10-CM | POA: Insufficient documentation

## 2016-08-05 DIAGNOSIS — R451 Restlessness and agitation: Secondary | ICD-10-CM | POA: Diagnosis present

## 2016-08-05 DIAGNOSIS — Z833 Family history of diabetes mellitus: Secondary | ICD-10-CM | POA: Diagnosis not present

## 2016-08-05 DIAGNOSIS — Z8249 Family history of ischemic heart disease and other diseases of the circulatory system: Secondary | ICD-10-CM | POA: Diagnosis not present

## 2016-08-05 LAB — COMPREHENSIVE METABOLIC PANEL
ALT: 17 U/L (ref 14–54)
AST: 20 U/L (ref 15–41)
Albumin: 4.6 g/dL (ref 3.5–5.0)
Alkaline Phosphatase: 58 U/L (ref 38–126)
Anion gap: 9 (ref 5–15)
BUN: 6 mg/dL (ref 6–20)
CHLORIDE: 102 mmol/L (ref 101–111)
CO2: 27 mmol/L (ref 22–32)
CREATININE: 0.96 mg/dL (ref 0.44–1.00)
Calcium: 9.5 mg/dL (ref 8.9–10.3)
GFR calc non Af Amer: >60 mL/min (ref >60)
Glucose, Bld: 93 mg/dL (ref 65–99)
POTASSIUM: 3.4 mmol/L — AB (ref 3.5–5.1)
SODIUM: 138 mmol/L (ref 135–145)
Total Bilirubin: 1 mg/dL (ref 0.3–1.2)
Total Protein: 8.1 g/dL (ref 6.5–8.1)

## 2016-08-05 LAB — RAPID URINE DRUG SCREEN, HOSP PERFORMED
AMPHETAMINES: NOT DETECTED
BENZODIAZEPINES: NOT DETECTED
Barbiturates: NOT DETECTED
Cocaine: NOT DETECTED
Opiates: NOT DETECTED
TETRAHYDROCANNABINOL: NOT DETECTED

## 2016-08-05 LAB — URINALYSIS, ROUTINE W REFLEX MICROSCOPIC
BILIRUBIN URINE: NEGATIVE
Glucose, UA: NEGATIVE mg/dL
HGB URINE DIPSTICK: NEGATIVE
KETONES UR: NEGATIVE mg/dL
Leukocytes, UA: NEGATIVE
NITRITE: NEGATIVE
Protein, ur: NEGATIVE mg/dL
SPECIFIC GRAVITY, URINE: 1.006 (ref 1.005–1.030)
pH: 6.5 (ref 5.0–8.0)

## 2016-08-05 LAB — CBC WITH DIFFERENTIAL/PLATELET
BASOS ABS: 0 10*3/uL (ref 0.0–0.1)
Basophils Relative: 0 %
Eosinophils Absolute: 0 10*3/uL (ref 0.0–0.7)
Eosinophils Relative: 0 %
HEMATOCRIT: 37.4 % (ref 36.0–46.0)
HEMOGLOBIN: 13.1 g/dL (ref 12.0–15.0)
LYMPHS ABS: 2 10*3/uL (ref 0.7–4.0)
LYMPHS PCT: 33 %
MCH: 29.2 pg (ref 26.0–34.0)
MCHC: 35 g/dL (ref 30.0–36.0)
MCV: 83.3 fL (ref 78.0–100.0)
Monocytes Absolute: 0.5 10*3/uL (ref 0.1–1.0)
Monocytes Relative: 8 %
NEUTROS ABS: 3.5 10*3/uL (ref 1.7–7.7)
NEUTROS PCT: 59 %
Platelets: 208 10*3/uL (ref 150–400)
RBC: 4.49 MIL/uL (ref 3.87–5.11)
RDW: 12.2 % (ref 11.5–15.5)
WBC: 5.9 10*3/uL (ref 4.0–10.5)

## 2016-08-05 LAB — POC URINE PREG, ED: PREG TEST UR: NEGATIVE

## 2016-08-05 LAB — ETHANOL

## 2016-08-05 MED ORDER — ALUM & MAG HYDROXIDE-SIMETH 200-200-20 MG/5ML PO SUSP: 30.0000 mL | ORAL | Status: DC | PRN

## 2016-08-05 MED ORDER — IBUPROFEN 800 MG PO TABS
800.0000 mg | ORAL_TABLET | Freq: Once | ORAL | Status: AC
Start: 2016-08-05 — End: 2016-08-05
  Administered 2016-08-05: 800 mg via ORAL
  Filled 2016-08-05: qty 1

## 2016-08-05 MED ORDER — LORAZEPAM 1 MG PO TABS
1.0000 mg | ORAL_TABLET | Freq: Once | ORAL | Status: AC
  Administered 2016-08-05: 1 mg via ORAL
  Filled 2016-08-05: qty 1

## 2016-08-05 MED ORDER — IBUPROFEN 200 MG PO TABS
600.0000 mg | ORAL_TABLET | Freq: Three times a day (TID) | ORAL | Status: DC | PRN
Start: 2016-08-05 — End: 2016-08-06

## 2016-08-05 MED ORDER — NICOTINE 21 MG/24HR TD PT24
21.0000 mg | MEDICATED_PATCH | Freq: Every day | TRANSDERMAL | Status: DC
  Administered 2016-08-06: 21 mg via TRANSDERMAL
  Filled 2016-08-05: qty 1

## 2016-08-05 MED ORDER — ZOLPIDEM TARTRATE 5 MG PO TABS
5.0000 mg | ORAL_TABLET | Freq: Every evening | ORAL | Status: DC | PRN
  Administered 2016-08-06: 5 mg via ORAL
  Filled 2016-08-05: qty 1

## 2016-08-05 MED ORDER — LORAZEPAM 2 MG/ML IJ SOLN
2.0000 mg | Freq: Once | INTRAMUSCULAR | Status: AC
  Administered 2016-08-05: 2 mg via INTRAMUSCULAR
  Filled 2016-08-05: qty 1

## 2016-08-05 MED ORDER — ACETAMINOPHEN 325 MG PO TABS: 650.0000 mg | ORAL_TABLET | ORAL | Status: DC | PRN

## 2016-08-05 MED ORDER — ONDANSETRON HCL 4 MG PO TABS: 4.0000 mg | ORAL_TABLET | Freq: Three times a day (TID) | ORAL | Status: DC | PRN

## 2016-08-05 NOTE — ED Notes (Signed)
WILL TRANSPORT PT TO [facility] RM 42. AAOX4. PT IN NO APPARENT DISTRESS OR PAIN. THE OPPORTUNITY TO ASK QUESTIONS WAS PROVIDED. SECURITY AT THE BEDSIDE.
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
## 2016-08-05 NOTE — ED Notes (Signed)
Bed: [id number] Expected date:  Expected time:  Means of arrival:  Comments: 20 yo flank pain; dx UTI yesterday

## 2016-08-05 NOTE — ED Notes (Signed)
Pt sleeping at present, no distress noted, calm & cooperative at present.  Monitoring for safety, Q 15 min checks in effect. 
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
## 2016-08-05 NOTE — ED Notes (Signed)
Bed: [id number] Expected date:  Expected time:  Means of arrival:  Comments: rm 22

## 2016-08-05 NOTE — ED Notes (Addendum)
Scrubs /socks changed, under clothes checked and returned to pt  On arrival to unit
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
## 2016-08-05 NOTE — ED Notes (Signed)
Patient agitated stating "I want to go home!  I am not staying here!  I am not crazy!  I have shit to do.  I don't need to be here!"  Patient is agitated pacing, hitting the glass, and slamming doors.  Patient was given IM ativan for agitation per physician's order.

## 2016-08-06 DIAGNOSIS — Z833 Family history of diabetes mellitus: Secondary | ICD-10-CM

## 2016-08-06 DIAGNOSIS — Z808 Family history of malignant neoplasm of other organs or systems: Secondary | ICD-10-CM

## 2016-08-06 DIAGNOSIS — F17210 Nicotine dependence, cigarettes, uncomplicated: Secondary | ICD-10-CM

## 2016-08-06 DIAGNOSIS — Z8249 Family history of ischemic heart disease and other diseases of the circulatory system: Secondary | ICD-10-CM | POA: Diagnosis not present

## 2016-08-06 DIAGNOSIS — F319 Bipolar disorder, unspecified: Secondary | ICD-10-CM | POA: Diagnosis present

## 2016-08-06 DIAGNOSIS — Z79899 Other long term (current) drug therapy: Secondary | ICD-10-CM

## 2016-08-06 MED ORDER — TRAZODONE HCL 50 MG PO TABS: 50.0000 mg | ORAL_TABLET | Freq: Every day | ORAL | Status: DC

## 2016-08-06 MED ORDER — OXCARBAZEPINE 300 MG PO TABS
300.0000 mg | ORAL_TABLET | Freq: Two times a day (BID) | ORAL | Status: DC
  Administered 2016-08-06: 300 mg via ORAL
  Filled 2016-08-06: qty 1

## 2016-08-06 MED ORDER — RISPERIDONE 0.5 MG PO TABS: 0.2500 mg | ORAL_TABLET | Freq: Two times a day (BID) | ORAL | Status: DC

## 2016-08-06 MED ORDER — OLANZAPINE 10 MG PO TBDP
10.0000 mg | ORAL_TABLET | Freq: Three times a day (TID) | ORAL | Status: DC | PRN
  Administered 2016-08-06: 10 mg via ORAL
  Filled 2016-08-06: qty 1

## 2016-08-06 MED ORDER — DIPHENHYDRAMINE HCL 50 MG/ML IJ SOLN
50.0000 mg | Freq: Once | INTRAMUSCULAR | Status: AC
  Administered 2016-08-06: 50 mg via INTRAMUSCULAR
  Filled 2016-08-06: qty 1

## 2016-08-06 MED ORDER — LORAZEPAM 2 MG/ML IJ SOLN
2.0000 mg | Freq: Once | INTRAMUSCULAR | Status: AC
  Administered 2016-08-06: 2 mg via INTRAMUSCULAR
  Filled 2016-08-06: qty 1
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
## 2016-08-06 NOTE — ED Notes (Signed)
Pt put a shirt on to use the telephone and the bathroom. She continues to rant, even when no one appears to be listening to her.

## 2016-08-06 NOTE — ED Notes (Signed)
Pt requesting home meds of Diflucan and Flagyl.  Dr Devoria AlbeIva Knapp notified.

## 2016-08-06 NOTE — Progress Notes (Signed)
Pt noted with a pcp not in system - [facility] Cm reviewed pt chart to note pt was seen on 08/01/16, 08/03/16, 08/04/16 and pt leaving AMA on 08/04/16 prior to 08/05/16 ED [facility] admission dx delusional disorder PMH ADHD, SI, chlamydia  Cm spoke with pt about medicaid pcp Pt stated she previously saw Dr Francoise CeoBernard Marshall, "he left" Cm offered pt a list of [place] pcps [place] but pt informed cm she has a list at home Cm recalled at that time Cm had spoken with pt on 08/01/16 to offer her resources (when pt had her baby present)  CM discussed again need to get a medicaid provider and contact DSS to inform them of pcp being seen Pt voiced understanding  Pt sitting in floor with knees to her chest speaking with another pt on her unit as he stood beside her  Pt repeated what Cm informed her as teach back method  Cm still placed another [place] in her bag in locker

## 2016-08-06 NOTE — ED Notes (Signed)
Pt's Aunt Semantha visited and pt became agitated and angry again that she cannot leave at this time. She slammed the door to her room hard so that the door popped out of the door frame, screws and all of the attachment hardware.

## 2016-08-06 NOTE — ED Notes (Signed)
[facility]  Pt was very tearful.  She was in no distress at discharge and all belongings were sent with pt.

## 2016-08-06 NOTE — Progress Notes (Signed)
08/06/16 1340:  LRT went to offer pt activities, pt was sleep.  Caroll RancherMarjette Arnulfo Batson, LRT/CTRS
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
## 2016-08-06 NOTE — BH Assessment (Addendum)
[facility] Assessment Progress Note  Per Thedore MinsMojeed Akintayo, MD, this pt requires psychiatric hospitalization.  Pt presents under IVC initiated by EDP Kandis Mannanourtney MacKuen, MD.  At 13:04 Marolyn Hallerarey Basinger [facility].  Pt has been accepted to their facility by Dr Freda JacksonJay Synn to [address] [facility].  Pt's nurse, Diane, has been notified, and agrees to call report to [phone].  [place] [facility] [place] [facility]   Doylene Canninghomas Tiandra Swoveland, MA Triage Specialist [phone]   Addendum:  At 13:30 EDP Frederick Peersachel Little, MD concurs with this decision.  Doylene Canninghomas Ryker [place], MA Triage Specialist [phone]

## 2016-08-26 ENCOUNTER — Emergency Department (HOSPITAL_COMMUNITY)
Admission: EM | Admit: 2016-08-26 | Discharge: 2016-08-26 | Disposition: A | Discharge status: Home / Self Care | Payer: Medicaid Other | Attending: Emergency Medicine | Admitting: Emergency Medicine

## 2016-08-26 ENCOUNTER — Encounter (HOSPITAL_COMMUNITY): Payer: Self-pay | Admitting: Emergency Medicine

## 2016-08-26 DIAGNOSIS — Z711 Person with feared health complaint in whom no diagnosis is made: Secondary | ICD-10-CM | POA: Diagnosis not present

## 2016-08-26 DIAGNOSIS — K089 Disorder of teeth and supporting structures, unspecified: Secondary | ICD-10-CM | POA: Diagnosis present

## 2016-08-26 DIAGNOSIS — F909 Attention-deficit hyperactivity disorder, unspecified type: Secondary | ICD-10-CM | POA: Insufficient documentation

## 2016-08-26 DIAGNOSIS — F17210 Nicotine dependence, cigarettes, uncomplicated: Secondary | ICD-10-CM | POA: Insufficient documentation

## 2016-08-26 NOTE — ED Triage Notes (Signed)
Pt had oral sex yesterday and states that she noticed a cyst on her tooth and is c/o of her throat feeling funny/itchy

## 2016-08-26 NOTE — ED Provider Notes (Signed)
[facility]-EMERGENCY DEPT Provider Note   CSN: [id number] Arrival date & time: 08/26/16  0547     History   Chief Complaint Chief Complaint  Patient presents with  . Dental Injury    HPI Amanda Russell is a 20 y.o. female.  HPI Patient states that she was looking in the back of her mouth and noticed bumps on the back of her tongue. She does not know how long they have been present. They do not hurt. She is having no fever or chills. She has no injury. She has no difficulty swallowing or sore throat. She wants them to be evaluated. Past Medical History:  Diagnosis Date  . ADHD (attention deficit hyperactivity disorder), combined type 07/14/2012  . Chlamydia   . Suicidal ideation     Patient Active Problem List   Diagnosis Date Noted  . Bipolar disorder with psychotic features (HCC) 08/06/2016  . Preterm premature rupture of membranes (PPROM) with unknown onset of labor 07/13/2014  . NVD (normal vaginal delivery) 07/13/2014  . Suicidal ideation 07/14/2012  . Depression 07/14/2012  . ADHD (attention deficit hyperactivity disorder), combined type 07/14/2012  . Oppositional defiant disorder 07/14/2012  . Parent-child relational problem 07/14/2012    Past Surgical History:  Procedure Laterality Date  . NO PAST SURGERIES      OB History    Gravida Para Term Preterm AB Living   2 2 1 1   2    SAB TAB Ectopic Multiple Live Births           2       Home Medications    Prior to Admission medications   Medication Sig Start Date End Date Taking? Authorizing Provider  metroNIDAZOLE (FLAGYL) 500 MG tablet Take 1 tablet (500 mg total) by mouth 2 (two) times daily. 08/02/16   Bethel BornKelly Marie Gekas, PA-C    Family History Family History  Problem Relation Age of Onset  . Cancer Neg Hx   . Diabetes Neg Hx   . Hypertension Neg Hx     Social History Social History  Substance Use Topics  . Smoking status: Current Every Day Smoker    Packs/day: 1.00    Years: 0.80    Types:  Cigarettes    Last attempt to quit: 01/07/2011  . Smokeless tobacco: Never Used  . Alcohol use No     Allergies   Patient has no known allergies.   Review of Systems Review of Systems  Constitutional: Negative for chills and fever.  HENT: Negative for dental problem, facial swelling and sore throat.   Respiratory: Negative for shortness of breath.   Musculoskeletal: Negative for neck pain and neck stiffness.  Skin: Negative for rash.  All other systems reviewed and are negative.    Physical Exam Updated Vital Signs BP 112/79   Pulse 69   Temp 98.1 F (36.7 C) (Oral)   Resp 18   LMP 07/21/2016   SpO2 99%   Physical Exam  Constitutional: She is oriented to person, place, and time. She appears well-developed and well-nourished.  Sleeping but easily aroused.  HENT:  Head: Normocephalic and atraumatic.  Mouth/Throat: Oropharynx is clear and moist. No oropharyngeal exudate.  When asked which bumps she is concerned about patient is pointing to papilla on the posterior surface of the tongue. No obvious swelling. Oropharynx is clear. Mucosa is normal.  Eyes: EOM are normal. Pupils are equal, round, and reactive to light.  Neck: Normal range of motion. Neck supple.  No cervical lymphadenopathy  or meningismus  Cardiovascular: Normal rate and regular rhythm.   Pulmonary/Chest: Effort normal and breath sounds normal.  Abdominal: Soft. Bowel sounds are normal. There is no tenderness. There is no rebound and no guarding.  Musculoskeletal: Normal range of motion. She exhibits no edema or tenderness.  Lymphadenopathy:    She has no cervical adenopathy.  Neurological: She is alert and oriented to person, place, and time.  Skin: Skin is warm and dry. Capillary refill takes less than 2 seconds. No rash noted. No erythema.  Psychiatric: She has a normal mood and affect. Her behavior is normal.  Nursing note and vitals reviewed.    ED Treatments / Results  Labs (all labs ordered are  listed, but only abnormal results are displayed) Labs Reviewed - No data to display  EKG  EKG Interpretation None       Radiology No results found.  Procedures Procedures (including critical care time)  Medications Ordered in ED Medications - No data to display   Initial Impression / Assessment and Plan / ED Course  I have reviewed the triage vital signs and the nursing notes.  Pertinent labs & imaging results that were available during my care of the patient were reviewed by me and considered in my medical decision making (see chart for details).  Clinical Course     Patient reassured that this is normal anatomy of the tongue.   Final Clinical Impressions(s) / ED Diagnoses   Final diagnoses:  Physically well but worried    New Prescriptions New Prescriptions   No medications on file     Loren Raceravid Kymir Coles, MD 08/26/16 [phone]

## 2016-09-03 ENCOUNTER — Encounter (HOSPITAL_COMMUNITY): Payer: Self-pay | Admitting: *Deleted

## 2016-09-03 DIAGNOSIS — R509 Fever, unspecified: Secondary | ICD-10-CM | POA: Insufficient documentation

## 2016-09-03 DIAGNOSIS — F909 Attention-deficit hyperactivity disorder, unspecified type: Secondary | ICD-10-CM | POA: Insufficient documentation

## 2016-09-03 DIAGNOSIS — F17210 Nicotine dependence, cigarettes, uncomplicated: Secondary | ICD-10-CM | POA: Insufficient documentation

## 2016-09-03 DIAGNOSIS — Z711 Person with feared health complaint in whom no diagnosis is made: Secondary | ICD-10-CM | POA: Insufficient documentation

## 2016-09-03 LAB — CBC WITH DIFFERENTIAL/PLATELET
BASOS PCT: 0 %
Basophils Absolute: 0 10*3/uL (ref 0.0–0.1)
Eosinophils Absolute: 0 10*3/uL (ref 0.0–0.7)
Eosinophils Relative: 0 %
HEMATOCRIT: 35.4 % — AB (ref 36.0–46.0)
HEMOGLOBIN: 12.6 g/dL (ref 12.0–15.0)
LYMPHS ABS: 2.4 10*3/uL (ref 0.7–4.0)
LYMPHS PCT: 33 %
MCH: 29.1 pg (ref 26.0–34.0)
MCHC: 35.6 g/dL (ref 30.0–36.0)
MCV: 81.8 fL (ref 78.0–100.0)
MONO ABS: 0.5 10*3/uL (ref 0.1–1.0)
MONOS PCT: 7 %
NEUTROS ABS: 4.3 10*3/uL (ref 1.7–7.7)
NEUTROS PCT: 60 %
Platelets: 225 10*3/uL (ref 150–400)
RBC: 4.33 MIL/uL (ref 3.87–5.11)
RDW: 12.1 % (ref 11.5–15.5)
WBC: 7.2 10*3/uL (ref 4.0–10.5)

## 2016-09-03 LAB — COMPREHENSIVE METABOLIC PANEL
ALBUMIN: 4.2 g/dL (ref 3.5–5.0)
ALT: 21 U/L (ref 14–54)
AST: 23 U/L (ref 15–41)
Alkaline Phosphatase: 63 U/L (ref 38–126)
Anion gap: 5 (ref 5–15)
BILIRUBIN TOTAL: 0.6 mg/dL (ref 0.3–1.2)
BUN: 5 mg/dL — AB (ref 6–20)
CO2: 26 mmol/L (ref 22–32)
Calcium: 9.5 mg/dL (ref 8.9–10.3)
Chloride: 106 mmol/L (ref 101–111)
Creatinine, Ser: 0.82 mg/dL (ref 0.44–1.00)
GFR calc Af Amer: >60 mL/min (ref >60)
GFR calc non Af Amer: >60 mL/min (ref >60)
GLUCOSE: 91 mg/dL (ref 65–99)
Potassium: 3.7 mmol/L (ref 3.5–5.1)
SODIUM: 137 mmol/L (ref 135–145)
TOTAL PROTEIN: 7.2 g/dL (ref 6.5–8.1)

## 2016-09-03 LAB — URINALYSIS, ROUTINE W REFLEX MICROSCOPIC
BILIRUBIN URINE: NEGATIVE
GLUCOSE, UA: NEGATIVE mg/dL
Hgb urine dipstick: NEGATIVE
KETONES UR: NEGATIVE mg/dL
Leukocytes, UA: NEGATIVE
Nitrite: NEGATIVE
PH: 6.5 (ref 5.0–8.0)
Protein, ur: NEGATIVE mg/dL
SPECIFIC GRAVITY, URINE: 1.019 (ref 1.005–1.030)

## 2016-09-03 LAB — POC URINE PREG, ED: Preg Test, Ur: NEGATIVE

## 2016-09-03 LAB — I-STAT CG4 LACTIC ACID, ED: Lactic Acid, Venous: 0.8 mmol/L (ref 0.5–1.9)

## 2016-09-03 NOTE — ED Triage Notes (Signed)
Pt has had a fever x 2 weeks. Has not actually checked temperature, states "it's just high." Pt was seen here two weeks ago for same and told she had a dental infection. Pt had teeth removed, has continued to have a fever. States "I want to be checked for an infection because I can't get this fever to go away." Has not tried tylenol or ibuprofen

## 2016-09-04 ENCOUNTER — Emergency Department (HOSPITAL_COMMUNITY)
Admission: EM | Admit: 2016-09-04 | Discharge: 2016-09-04 | Disposition: A | Discharge status: Home / Self Care | Payer: Medicaid Other | Attending: Emergency Medicine | Admitting: Emergency Medicine

## 2016-09-04 DIAGNOSIS — Z711 Person with feared health complaint in whom no diagnosis is made: Secondary | ICD-10-CM

## 2016-09-04 MED ORDER — ONDANSETRON 4 MG PO TBDP
4.0000 mg | ORAL_TABLET | Freq: Once | ORAL | Status: AC
  Administered 2016-09-04: 4 mg via ORAL
  Filled 2016-09-04: qty 1

## 2016-09-04 MED ORDER — ACETAMINOPHEN 325 MG PO TABS
650.0000 mg | ORAL_TABLET | Freq: Once | ORAL | Status: AC
  Administered 2016-09-04: 650 mg via ORAL
  Filled 2016-09-04: qty 2

## 2016-09-04 MED ORDER — ACETAMINOPHEN 500 MG PO TABS: 500.0000 mg | ORAL_TABLET | Freq: Four times a day (QID) | ORAL | 0 refills | Status: DC | PRN

## 2016-09-04 NOTE — Discharge Instructions (Signed)
RESOURCE GUIDE ° °Chronic Pain Problems: °Contact [facility]  [phone] °Patients need to be referred by their primary care doctor. ° °Insufficient Money for Medicine: °Contact United Way:  call "211" or [facility] 271-5999. ° °No Primary Care Doctor: °Call [facility]  832-8000 - can help you locate a primary care doctor that  accepts your insurance, provides certain services, etc. °Physician Referral Service- 1-800-533-3463 ° °Agencies that provide inexpensive medical care: °[facility]  832-8035 °[facility]  832-7272 °[facility]  [phone] °[facility] Clinic  832-4777 °Planned Parenthood  373-0678 °[facility]  272-1050 ° °Medicaid-accepting [place] Providers: °[facility]- [address] ° [phone], Mon-Fri 9am-7pm, Sat 9am-1pm °[facility]- [address], [address] ° [phone] °[facility]- [address] ° [phone] °[facility]- [address] [place] [address] ° [phone] °[facility]- [address], [phone] ° Only accepts [place] Access Medicaid patients after they have their name  applied to their card ° °Self Pay (no insurance) in [place]: °Sickle Cell Patients: Dr Eric Dean, [facility] ° [address], [phone] °[facility]- [address] ° [phone] °      -     [facility]- [address] °      -     [facility]- see information above (Speak to Pam H if you do not have insurance) °      -  [facility]- [address], [phone] °      -  [facility] [place]- [address],  [phone] °      -  [facility]- [address] [place] [address], [phone] °      -  Dr Osei-Bonsu-  [address], [place], [phone] °      -  [facility]- [address], [phone] °      -   [facility]- [address] [place] [address], [phone], also [address], [phone] °      -    [facility]- [address], [phone], 1st & 3rd Saturday   every month, 10am-1pm ° °1) Find a Doctor and Pay Out of Pocket °Although you won't have to find out who is covered by your insurance plan, it is a good idea to ask around and get recommendations. You will then need to call the office and see if the doctor you have chosen will accept you as a new patient and what types of options they offer for patients who are self-pay. Some doctors offer discounts or will set up payment plans for their patients who do not have insurance, but you will need to ask so you aren't surprised when you get to your appointment. ° °2) Contact Your Local Health Department °Not all health departments have doctors that can see patients for sick visits, but many do, so it is worth a call to see if yours does. If you don't know where your local health department is, you can check in your phone book. The CDC also has a tool to help you locate your state's health department, and many state websites also have listings of all of their local health departments. ° °3) Find a Walk-in Clinic °If   your illness is not likely to be very severe or complicated, you may want to try a walk in clinic. These are popping up all over the country in pharmacies, drugstores, and shopping centers. They're usually staffed by nurse practitioners or physician assistants that have been trained to treat common illnesses and complaints. They're usually fairly quick and inexpensive. However, if you have serious medical issues or chronic medical problems, these are probably not your best option ° °STD Testing °[place] [facility] [place], [facility] Clinic, [address], [place], phone [phone] or 1-877-539-9860.  Monday - Friday, call for an appointment. °[place] [facility] [place], STD  Clinic, [address], [place], phone [phone] or 1-877-539-9860.  Monday - Friday, call for an appointment. ° °Abuse/Neglect: °[place] Child Abuse Hotline (336) 641-3795 °[place] Child Abuse Hotline 800-378-5315 (After Hours) ° °Emergency Shelter:  [facility] (336) 271-5985 ° °Maternity Homes: °[facility] (336) 275-9566 °[facility] (704) 372-4663 ° °MRSA Hotline #:   832-7006 ° °[place] Resources ° °[facility] [place]  [employer] [place] [facility] °[address]                 [address]         [address]  °[place]                                               [place]                              [place] °Phone:  349-3220                                  Phone:  [phone]                   Phone:  342-8140 ° °[place] [facility], 342-8316 °[place] [facility] - [facility]- 1-888-581-9988 °      -     [facility] in [place], [address],                                  336-349-4454, Insurance ° °[place] Child Abuse Hotline °(336) 342-1394 or (336) 342-3537 (After Hours) ° ° °Behavioral Health Services ° °Substance Abuse Resources: °[facility]  336-882-2125 °[facility] 336-784-9470 °[facility] 336-285-9073 °[facility] 336-845-3988 °Residential & Outpatient Substance Abuse Program  800-659-3381 ° °Psychological Services: °[facility]  832-9600 °[facility]  378-7881 °[facility], [address], [place], ACCESS LINE: 1-800-853-5163 or 336-641-4981, [email or web address].htm ° °Dental Assistance ° °If unable to pay or uninsured, contact:  [facility] or [place] [facility] to become qualified for the adult dental clinic. ° °Patients with Medicaid: [facility] °[address], 632-0744 °[address], 510-2600 ° °If unable to pay, or uninsured, contact [facility] (271-5999) or [place] [facility] (641-3152 in , 842-7733 in [place]) to become qualified for the adult dental clinic ° °Other Low-Cost   Community Dental Services: °[facility]- [address], [place], [place], [address], 723-1848, Ext. 123, 2nd and 4th Thursday of the month at 6:30am.  10 clients each day by appointment, can sometimes see walk-in patients if someone does not show for an appointment. °[facility]- [address], [place], [place], [address], 723-7904 °[facility]- [address], [place], [place], [address], 631-2330 °[place] [facility]- 342-8273 °[facility]- 703-3100 °[facility]- 570-6415 ° °Please make every effort to establish with a primary care physician for routine medical care ° °Adult Health Services  °The [place] [facility] provides a wide range of adult health services. Some of these services are designed to address the healthcare needs of all [place] residents and all services are designed to meet the needs of uninsured/underinsured low income residents. Some services are available to any resident of [place], call [phone] for details. °] °[facility], a new medical clinic for adults, is now open. For more information about the Center and its services please call [phone]. °For information on our [facility] services, click here. ° °For more information on any of the following Department of Public Health programs, including hours of service, click on the highlighted link. ° °SERVICES FOR WOMEN (Adults and Teens) °Family Planning Services provide a full range of birth control options plus education and counseling. New patient visit and annual return visits include a complete  examination, pap test as indicated, and other laboratory as indicated. Included is our Regional Vasectomy Program for men. ° °Maternity Care is provided through pregnancy, including a six week post partum exam. Women who meet eligibility criteria for the Medicaid for Pregnant Women program, receive care free. Other women are charged on a sliding scale according to income. °Note: [facility] provides services to pregnant women who have a Medicaid card. Call [phone] for an appointment in [place] or [phone] for an appointment in [place]. ° °Primary Care for Medicaid [place] Access Women is available through the [place] [facility]. As primary care provider for the [place] Access Medicaid Managed Care program, women may designate the Women?s Health clinic as their primary care provider. ° °PLEASE CALL [phone] FOR AN APPOINTMENT FOR THE ABOVE SERVICES IN EITHER [place] OR [place]. Information available in English and Spanish.  ° °Childbirth Education Classes are open to the public and offered to help families prepare for the best possible childbirth experience as well as to promote lifelong health and wellness. Classes are offered throughout the year and meet on the same night once a week for five weeks. Medicaid covers the cost of the classes for the mother-to-be and her partner. For participants without Medicaid, the cost of the class series is $45.00 for the mother-to-be and her partner. Class size is limited and registration is required. For more information or to register call 336-641-4718. Baby items donated by [employer] and the [employer] are given away during each class series. ° °SERVICES FOR WOMEN AND MEN °Sexually Transmitted Infection appointments, including HIV testing, are available daily (weekdays, except holidays). Call early as same-day appointments are limited. For an appointment in either [place] or [place],  call [phone]. Services are confidential and free of charge. ° °Skin Testing for Tuberculosis Please call [phone]. °Adult Immunizations are available, usually for a fee. Please call [phone] for details. ° °PLEASE CALL [phone] FOR AN APPOINTMENT FOR THE ABOVE   SERVICES IN EITHER [place] OR [place].  ° °[facility] provides up to the minute recommended vaccines for your travel destination. We also provide essential health and political information to help insure a safe and pleasurable travel experience. This program is self-sustaining, however, fees are very competitive. We are a CERTIFIED YELLOW FEVER IMMUNIZATION approved clinic site. °PLEASE CALL [phone] FOR AN APPOINTMENT IN EITHER [place] OR [place].  ° °If you have questions about the services listed above, we want to answer them! Email us at: [email or web address] °Home Visiting Services for elderly and the disabled are available to residents of [place] who are in need of care that compares to the care offered by a nursing home, have needs that can be met by the program, and have [place] Medicaid. Other short term services are available to residents 18 years and older who are unable to meet requirements for eligibility to receive services from a certified home health agency, spend the majority of time at home, and need care for six months or less. ° °PLEASE CALL [phone] OR [phone] FOR MORE INFORMATION. °Medication Assistance Program serves as a link between pharmaceutical companies and patients to provide low cost or free prescription medications. This servce is available for residents who meet certain income restrictions and have no insurance coverage. ° °PLEASE CALL 641-8030 () OR [phone] ([place]) FOR MORE INFORMATION.  °Updated Feb. 21, 2013 ° ° °

## 2016-09-04 NOTE — ED Provider Notes (Signed)
[facility]-EMERGENCY DEPT Provider Note   CSN: [id number] Arrival date & time: 09/03/16  2209   History   Chief Complaint Chief Complaint  Patient presents with  . Fever  . Headache    HPI Amanda Russell is a 20 y.o. female.  HPI   Patient to the ER for evaluation of fever for the past two weeks. She has PMH of ADHD, chlamydia and SI. She was seen a few days ago having a normal evaluation of her tongue. She has not tried taking her temp and she was afebrile at her visit here a few days ago but says she knows that she is feeling hot. She is not having any any dental pain. No sore throat. She has not taken anything at home. She denies N/V/D. She denies headache, weakness, confusion. She has not had dysuria, abdominal pain or back pain. Pt is well appearing.  Past Medical History:  Diagnosis Date  . ADHD (attention deficit hyperactivity disorder), combined type 07/14/2012  . Chlamydia   . Suicidal ideation     Patient Active Problem List   Diagnosis Date Noted  . Bipolar disorder with psychotic features (HCC) 08/06/2016  . Preterm premature rupture of membranes (PPROM) with unknown onset of labor 07/13/2014  . NVD (normal vaginal delivery) 07/13/2014  . Suicidal ideation 07/14/2012  . Depression 07/14/2012  . ADHD (attention deficit hyperactivity disorder), combined type 07/14/2012  . Oppositional defiant disorder 07/14/2012  . Parent-child relational problem 07/14/2012    Past Surgical History:  Procedure Laterality Date  . NO PAST SURGERIES      OB History    Gravida Para Term Preterm AB Living   2 2 1 1   2    SAB TAB Ectopic Multiple Live Births           2       Home Medications    Prior to Admission medications   Medication Sig Start Date End Date Taking? Authorizing Provider  acetaminophen (TYLENOL) 500 MG tablet Take 1 tablet (500 mg total) by mouth every 6 (six) hours as needed. 09/04/16   Kendon Sedeno Neva SeatGreene, PA-C  metroNIDAZOLE (FLAGYL) 500 MG tablet Take 1  tablet (500 mg total) by mouth 2 (two) times daily. Patient not taking: Reported on 09/04/2016 08/02/16   Bethel BornKelly Marie Gekas, PA-C    Family History Family History  Problem Relation Age of Onset  . Cancer Neg Hx   . Diabetes Neg Hx   . Hypertension Neg Hx     Social History Social History  Substance Use Topics  . Smoking status: Current Every Day Smoker    Packs/day: 1.00    Years: 0.80    Types: Cigarettes    Last attempt to quit: 01/07/2011  . Smokeless tobacco: Never Used  . Alcohol use No     Allergies   Patient has no known allergies.   Review of Systems Review of Systems  Review of Systems All other systems negative except as documented in the HPI. All pertinent positives and negatives as reviewed in the HPI.  Physical Exam Updated Vital Signs BP 114/73 (BP Location: Right Arm)   Pulse 79   Temp 98 F (36.7 C) (Oral)   Resp 18   LMP 07/29/2016 (Approximate)   SpO2 100%   Physical Exam  Constitutional: She appears well-developed and well-nourished. No distress.  HENT:  Head: Normocephalic and atraumatic.  Right Ear: Tympanic membrane and ear canal normal.  Left Ear: Tympanic membrane and ear canal normal.  Nose:  Nose normal.  Mouth/Throat: Uvula is midline, oropharynx is clear and moist and mucous membranes are normal.  Eyes: Pupils are equal, round, and reactive to light.  Neck: Normal range of motion. Neck supple.  Cardiovascular: Normal rate and regular rhythm.   Pulmonary/Chest: Effort normal.  Abdominal: Soft.  No signs of abdominal distention  Musculoskeletal:  No LE swelling  Neurological: She is alert.  Acting at baseline  Skin: Skin is warm and dry. No rash noted.  Nursing note and vitals reviewed.    ED Treatments / Results  Labs (all labs ordered are listed, but only abnormal results are displayed) Labs Reviewed  COMPREHENSIVE METABOLIC PANEL - Abnormal; Notable for the following:       Result Value   BUN 5 (*)    All other  components within normal limits  CBC WITH DIFFERENTIAL/PLATELET - Abnormal; Notable for the following:    HCT 35.4 (*)    All other components within normal limits  URINALYSIS, ROUTINE W REFLEX MICROSCOPIC (NOT AT [facility]) - Abnormal; Notable for the following:    APPearance CLOUDY (*)    All other components within normal limits  I-STAT CG4 LACTIC ACID, ED  POC URINE PREG, ED    EKG  EKG Interpretation None       Radiology No results found.  Procedures Procedures (including critical care time)  Medications Ordered in ED Medications  acetaminophen (TYLENOL) tablet 650 mg (not administered)  ondansetron (ZOFRAN-ODT) disintegrating tablet 4 mg (not administered)     Initial Impression / Assessment and Plan / ED Course  I have reviewed the triage vital signs and the nursing notes.  Pertinent labs & imaging results that were available during my care of the patient were reviewed by me and considered in my medical decision making (see chart for details).  Clinical Course    Pt concerned for 2 weeks of fever, she has not actually taken her temp but "feels hot". She has not taken any Ibuprofen or tylenol. Her lactate, CMP, CBC and UA are all unremarkable. She is not pregnant.   Patient reassured, given Tylenol in the ED and an rx for Ibuprofen. [facility]  I discussed results, diagnoses and plan with Amanda Russell. They voice there understanding and questions were answered. We discussed follow-up recommendations and return precautions.  Final Clinical Impressions(s) / ED Diagnoses   Final diagnoses:  Worried well    New Prescriptions New Prescriptions   ACETAMINOPHEN (TYLENOL) 500 MG TABLET    Take 1 tablet (500 mg total) by mouth every 6 (six) hours as needed.     Marlon Peliffany Rosaura Bolon, PA-C 09/04/16 [id number]    Tomasita CrumbleAdeleke Oni, MD 09/04/16 [phone]

## 2016-09-06 ENCOUNTER — Other Ambulatory Visit: Payer: Self-pay | Admitting: Obstetrics & Gynecology

## 2016-09-09 ENCOUNTER — Encounter (HOSPITAL_COMMUNITY): Payer: Self-pay | Admitting: Emergency Medicine

## 2016-09-09 ENCOUNTER — Ambulatory Visit (HOSPITAL_COMMUNITY)
Admission: EM | Admit: 2016-09-09 | Discharge: 2016-09-09 | Disposition: A | Discharge status: Home / Self Care | Payer: Medicaid Other | Attending: Emergency Medicine | Admitting: Emergency Medicine

## 2016-09-09 DIAGNOSIS — A084 Viral intestinal infection, unspecified: Secondary | ICD-10-CM | POA: Diagnosis not present

## 2016-09-09 DIAGNOSIS — Z711 Person with feared health complaint in whom no diagnosis is made: Secondary | ICD-10-CM

## 2016-09-09 LAB — POCT URINALYSIS DIP (DEVICE)
Bilirubin Urine: NEGATIVE
GLUCOSE, UA: NEGATIVE mg/dL
Ketones, ur: NEGATIVE mg/dL
Leukocytes, UA: NEGATIVE
NITRITE: NEGATIVE
PH: 6 (ref 5.0–8.0)
Protein, ur: 30 mg/dL — AB
Specific Gravity, Urine: 1.025 (ref 1.005–1.030)
UROBILINOGEN UA: 0.2 mg/dL (ref 0.0–1.0)

## 2016-09-09 MED ORDER — ONDANSETRON 4 MG PO TBDP: 4.0000 mg | ORAL_TABLET | Freq: Three times a day (TID) | ORAL | 0 refills | Status: DC | PRN

## 2016-09-09 NOTE — ED Provider Notes (Signed)
CSN: [id number]     Arrival date & time 09/09/16  1533 History   First MD Initiated Contact with Patient 09/09/16 1557     Chief Complaint  Patient presents with  . Abdominal Pain   (Consider location/radiation/quality/duration/timing/severity/associated sxs/prior Treatment) Patient c/o diarrhea x 2 days and lower abdominal discomfort for 2 days.  She denies any vaginal DC and states she has had recent OBGYN exam and was normal.  She states is belching and has bad taste in her mouth.  Patient denies any vaginal DC or pelvic pain but is concerned about possible STD since she has had unprotected sex.   The history is provided by the patient.  Abdominal Pain  Pain location:  LLQ and RLQ Pain quality: cramping   Pain radiates to:  Does not radiate Pain severity:  Moderate Onset quality:  Sudden Duration:  1 day Timing:  Intermittent Progression:  Unchanged Chronicity:  New Relieved by:  Nothing Worsened by:  Nothing Associated symptoms: fatigue     Past Medical History:  Diagnosis Date  . ADHD (attention deficit hyperactivity disorder), combined type 07/14/2012  . Chlamydia   . Suicidal ideation    Past Surgical History:  Procedure Laterality Date  . NO PAST SURGERIES     Family History  Problem Relation Age of Onset  . Cancer Neg Hx   . Diabetes Neg Hx   . Hypertension Neg Hx    Social History  Substance Use Topics  . Smoking status: Current Every Day Smoker    Packs/day: 0.50    Years: 0.80    Types: Cigarettes    Last attempt to quit: 01/07/2011  . Smokeless tobacco: Never Used  . Alcohol use No   OB History    Gravida Para Term Preterm AB Living   2 2 1 1   2    SAB TAB Ectopic Multiple Live Births           2     Review of Systems  Constitutional: Positive for fatigue.  HENT: Negative.   Eyes: Negative.   Respiratory: Negative.   Cardiovascular: Negative.   Gastrointestinal: Positive for abdominal pain.  Endocrine: Negative.   Genitourinary:  Negative.   Musculoskeletal: Negative.   Skin: Negative.   Allergic/Immunologic: Negative.   Neurological: Negative.   Hematological: Negative.   Psychiatric/Behavioral: Negative.     Allergies  Patient has no known allergies.  Home Medications   Prior to Admission medications   Medication Sig Start Date End Date Taking? Authorizing Provider  acetaminophen (TYLENOL) 500 MG tablet Take 1 tablet (500 mg total) by mouth every 6 (six) hours as needed. 09/04/16   Tiffany Neva SeatGreene, PA-C  metroNIDAZOLE (FLAGYL) 500 MG tablet Take 1 tablet (500 mg total) by mouth 2 (two) times daily. Patient not taking: Reported on 09/04/2016 08/02/16   Bethel BornKelly Marie Gekas, PA-C   Meds Ordered and Administered this Visit  Medications - No data to display  BP 113/72 (BP Location: Right Arm)   Pulse 111   Temp 97.7 F (36.5 C) (Oral)   Resp 18   Ht 5\' 5"  (1.651 m)   Wt 125 lb (56.7 kg)   LMP 08/27/2016   SpO2 98%   BMI 20.80 kg/m  No data found.   Physical Exam  Constitutional: She appears well-developed and well-nourished.  HENT:  Head: Normocephalic and atraumatic.  Eyes: Conjunctivae and EOM are normal. Pupils are equal, round, and reactive to light.  Neck: Normal range of motion. Neck supple.  Cardiovascular:  Normal rate, regular rhythm and normal heart sounds.   Pulmonary/Chest: Effort normal and breath sounds normal.  Abdominal: Soft. Bowel sounds are normal.  Nursing note and vitals reviewed.   Urgent Care Course   Clinical Course     Procedures (including critical care time)  Labs Review Labs Reviewed - No data to display  Imaging Review No results found.   Visual Acuity Review  Right Eye Distance:   Left Eye Distance:   Bilateral Distance:    Right Eye Near:   Left Eye Near:    Bilateral Near:         MDM  Viral Gastroenteritis - Zofran odt 4mg  ne po tid prn #21 Push po fluids, rest, tylenol and motrin otc prn as directed for fever, arthralgias, and myalgias.   Follow up prn if sx's continue or persist.  Concern for STD - Urine Cytology GC Chlamydia Trich     Deatra CanterWilliam J Kanisha Duba, FNP 09/09/16 1640

## 2016-09-09 NOTE — ED Triage Notes (Signed)
PT reports lower abdominal pain, yellow watery diarrhea x2, and when she burps it smells like rotten eggs.These symptoms started today

## 2016-09-11 LAB — URINE CYTOLOGY ANCILLARY ONLY
Chlamydia: NEGATIVE
Neisseria Gonorrhea: NEGATIVE
Trichomonas: NEGATIVE

## 2017-04-18 ENCOUNTER — Emergency Department (HOSPITAL_COMMUNITY)
Admission: EM | Admit: 2017-04-18 | Discharge: 2017-04-18 | Disposition: A | Discharge status: Home / Self Care | Payer: Medicaid Other | Attending: Emergency Medicine | Admitting: Emergency Medicine

## 2017-04-18 ENCOUNTER — Encounter (HOSPITAL_COMMUNITY): Payer: Self-pay | Admitting: Nurse Practitioner

## 2017-04-18 DIAGNOSIS — Y929 Unspecified place or not applicable: Secondary | ICD-10-CM | POA: Diagnosis not present

## 2017-04-18 DIAGNOSIS — S40021A Contusion of right upper arm, initial encounter: Secondary | ICD-10-CM | POA: Insufficient documentation

## 2017-04-18 DIAGNOSIS — X58XXXA Exposure to other specified factors, initial encounter: Secondary | ICD-10-CM | POA: Insufficient documentation

## 2017-04-18 DIAGNOSIS — F17210 Nicotine dependence, cigarettes, uncomplicated: Secondary | ICD-10-CM | POA: Diagnosis not present

## 2017-04-18 DIAGNOSIS — Y998 Other external cause status: Secondary | ICD-10-CM | POA: Insufficient documentation

## 2017-04-18 DIAGNOSIS — Y9389 Activity, other specified: Secondary | ICD-10-CM | POA: Insufficient documentation

## 2017-04-18 DIAGNOSIS — F902 Attention-deficit hyperactivity disorder, combined type: Secondary | ICD-10-CM | POA: Diagnosis not present

## 2017-04-18 DIAGNOSIS — T148XXA Other injury of unspecified body region, initial encounter: Secondary | ICD-10-CM

## 2017-04-18 DIAGNOSIS — F319 Bipolar disorder, unspecified: Secondary | ICD-10-CM | POA: Insufficient documentation

## 2017-04-18 NOTE — ED Notes (Signed)
Noticed small bruise on right upper arm this am. "It hurts". No known cause.

## 2017-04-18 NOTE — ED Notes (Signed)
Mom has been found and is here to speak to [employer]

## 2017-04-18 NOTE — ED Notes (Addendum)
[employer] in department. Apparently the child brought to ED by this pt does not belong to the patient.

## 2017-04-18 NOTE — ED Notes (Signed)
Pt still being interviewed by [employer].

## 2017-04-18 NOTE — Discharge Instructions (Signed)
Please read attached information. If you experience any new or worsening signs or symptoms please return to the emergency room for evaluation. Please follow-up with your primary care provider or specialist as discussed.  °
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
## 2017-04-18 NOTE — ED Notes (Signed)
Trey PaulaJeff PA comes out holding baby in arms, I take baby into room so he examn mom, child starts to cry and I ask can I go get  Bottle for baby and ask pt what kind of formula baby takes, asking enfamil? Mom states I dont know... I hand child back to pt and go get baby things from peds and Trey PaulaJeff is again holding baby  And tells me that pt has said someone paid her $ to keep baby till 4. We notify SW and they are calling CPS security here
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
## 2017-04-18 NOTE — Progress Notes (Signed)
Pediatric CSW consulted as assist to ED CSW for this complex case.  Patient presented to ED accompanied by 675 month old female infant. Initially, patient could not supply name of infant or infant's mother.  Patient has given multiple stories about how the infant came into her care (at an apartment complex, at the plasma center, while driving down the road).  Patient has consistently stated that she does not know the mother, unable to contact her.  Patient has said mother offered her $25 to keep infant for the day.  As conversations continued with multiple  people, patient stated that mother's name is "Victorino DikeJennifer" and baby's name is "Ethelene Brownsnthony." At one point, patient attempted to leave the ED.  [employer] has interviewed patient.   [facility]. Will follow up.   Gerrie NordmannMichelle Barrett-Hilton, LCSW [phone]

## 2017-04-18 NOTE — ED Notes (Signed)
Charge nurse and [employer] aware

## 2017-04-18 NOTE — ED Notes (Signed)
Pt cleared to leave by Trey PaulaJeff, PA, social worker, security, and [employer]. Pt walked to car with security to retrieve baby's diaper bag that was left in the car. Pt ambulatory with steady gait.

## 2017-04-18 NOTE — ED Notes (Signed)
Mother of baby currently speaking with [employer]. Per social work, Stage managerCPS worker on the way to speak with both pt and child's mother. Pt updated on plan to be interviewed by CPS worker, told to remain in treatment room until further notified by this nurse. Pt verbalized understanding but is visibly frustrated. Offered pt food and beverage, pt declined. Given call light in the event she needs anything.

## 2017-04-18 NOTE — ED Notes (Signed)
[employer] into speak to pt

## 2017-04-18 NOTE — ED Notes (Signed)
[employer] in with pt. Social work also present.

## 2017-04-19 NOTE — Progress Notes (Addendum)
CSW followed up with [place] about report that was made. CSW met with biological mom Jovita Kussmaul) to inform her that she needed to stay in order to speak with the [place] worker Langley [phone]). Biological mom questioned her reason for needing to speak with the CPS worker considering she didn't bring her baby to the ED. Pt Gabriel Cirri) was escorted out of [facility] ED by security once baby items had been received.    In waiting for CPS worker to arrive, Anderson Malta and her baby left the ED. [place] (Water engineer) contacted CSW and Peds CSW regarding the report. CSW notified CPS worker of biological moms address, phone number, as well as the child's biological fathers name to follow up with. No further information has been given to CSW about the incident at this time.   Virgie Dad Gevin Perea, MSW, [place] [facility] Clinical Social Worker [phone]

## 2017-05-03 ENCOUNTER — Inpatient Hospital Stay (HOSPITAL_COMMUNITY)
Admission: AD | Admit: 2017-05-03 | Discharge: 2017-05-03 | Disposition: A | Discharge status: Home / Self Care | Payer: Medicaid Other | Source: Ambulatory Visit | Attending: Obstetrics & Gynecology | Admitting: Obstetrics & Gynecology

## 2017-05-03 DIAGNOSIS — F17210 Nicotine dependence, cigarettes, uncomplicated: Secondary | ICD-10-CM | POA: Diagnosis not present

## 2017-05-03 DIAGNOSIS — O039 Complete or unspecified spontaneous abortion without complication: Secondary | ICD-10-CM | POA: Diagnosis present

## 2017-05-03 DIAGNOSIS — N939 Abnormal uterine and vaginal bleeding, unspecified: Secondary | ICD-10-CM | POA: Diagnosis not present

## 2017-05-03 LAB — URINALYSIS, ROUTINE W REFLEX MICROSCOPIC
BILIRUBIN URINE: NEGATIVE
GLUCOSE, UA: NEGATIVE mg/dL
HGB URINE DIPSTICK: NEGATIVE
Ketones, ur: NEGATIVE mg/dL
Leukocytes, UA: NEGATIVE
Nitrite: NEGATIVE
PROTEIN: NEGATIVE mg/dL
Specific Gravity, Urine: 1.01 (ref 1.005–1.030)
pH: 8 (ref 5.0–8.0)

## 2017-05-03 LAB — POCT PREGNANCY, URINE: PREG TEST UR: NEGATIVE

## 2017-05-03 NOTE — Discharge Instructions (Signed)
Contraception Choices Contraception (birth control) is the use of any methods or devices to prevent pregnancy. Below are some methods to help avoid pregnancy. Hormonal methods  Contraceptive implant. This is a thin, plastic tube containing progesterone hormone. It does not contain estrogen hormone. Your health care provider inserts the tube in the inner part of the upper arm. The tube can remain in place for up to 3 years. After 3 years, the implant must be removed. The implant prevents the ovaries from releasing an egg (ovulation), thickens the cervical mucus to prevent sperm from entering the uterus, and thins the lining of the inside of the uterus.  Progesterone-only injections. These injections are given every 3 months by your health care provider to prevent pregnancy. This synthetic progesterone hormone stops the ovaries from releasing eggs. It also thickens cervical mucus and changes the uterine lining. This makes it harder for sperm to survive in the uterus.  Birth control pills. These pills contain estrogen and progesterone hormone. They work by preventing the ovaries from releasing eggs (ovulation). They also cause the cervical mucus to thicken, preventing the sperm from entering the uterus. Birth control pills are prescribed by a health care provider.Birth control pills can also be used to treat heavy periods.  Minipill. This type of birth control pill contains only the progesterone hormone. They are taken every day of each month and must be prescribed by your health care provider.  Birth control patch. The patch contains hormones similar to those in birth control pills. It must be changed once a week and is prescribed by a health care provider.  Vaginal ring. The ring contains hormones similar to those in birth control pills. It is left in the vagina for 3 weeks, removed for 1 week, and then a new one is put back in place. The patient must be comfortable inserting and removing the ring from  the vagina.A health care provider's prescription is necessary.  Emergency contraception. Emergency contraceptives prevent pregnancy after unprotected sexual intercourse. This pill can be taken right after sex or up to 5 days after unprotected sex. It is most effective the sooner you take the pills after having sexual intercourse. Most emergency contraceptive pills are available without a prescription. Check with your pharmacist. Do not use emergency contraception as your only form of birth control. Barrier methods  Female condom. This is a thin sheath (latex or rubber) that is worn over the penis during sexual intercourse. It can be used with spermicide to increase effectiveness.  Female condom. This is a soft, loose-fitting sheath that is put into the vagina before sexual intercourse.  Diaphragm. This is a soft, latex, dome-shaped barrier that must be fitted by a health care provider. It is inserted into the vagina, along with a spermicidal jelly. It is inserted before intercourse. The diaphragm should be left in the vagina for 6 to 8 hours after intercourse.  Cervical cap. This is a round, soft, latex or plastic cup that fits over the cervix and must be fitted by a health care provider. The cap can be left in place for up to 48 hours after intercourse.  Sponge. This is a soft, circular piece of polyurethane foam. The sponge has spermicide in it. It is inserted into the vagina after wetting it and before sexual intercourse.  Spermicides. These are chemicals that kill or block sperm from entering the cervix and uterus. They come in the form of creams, jellies, suppositories, foam, or tablets. They do not require a prescription. They   are inserted into the vagina with an applicator before having sexual intercourse. The process must be repeated every time you have sexual intercourse. Intrauterine contraception  Intrauterine device (IUD). This is a T-shaped device that is put in a woman's uterus during  a menstrual period to prevent pregnancy. There are 2 types: ? Copper IUD. This type of IUD is wrapped in copper wire and is placed inside the uterus. Copper makes the uterus and fallopian tubes produce a fluid that kills sperm. It can stay in place for 10 years. ? Hormone IUD. This type of IUD contains the hormone progestin (synthetic progesterone). The hormone thickens the cervical mucus and prevents sperm from entering the uterus, and it also thins the uterine lining to prevent implantation of a fertilized egg. The hormone can weaken or kill the sperm that get into the uterus. It can stay in place for 3-5 years, depending on which type of IUD is used. Permanent methods of contraception  Female tubal ligation. This is when the woman's fallopian tubes are surgically sealed, tied, or blocked to prevent the egg from traveling to the uterus.  Hysteroscopic sterilization. This involves placing a small coil or insert into each fallopian tube. Your doctor uses a technique called hysteroscopy to do the procedure. The device causes scar tissue to form. This results in permanent blockage of the fallopian tubes, so the sperm cannot fertilize the egg. It takes about 3 months after the procedure for the tubes to become blocked. You must use another form of birth control for these 3 months.  Female sterilization. This is when the female has the tubes that carry sperm tied off (vasectomy).This blocks sperm from entering the vagina during sexual intercourse. After the procedure, the man can still ejaculate fluid (semen). Natural planning methods  Natural family planning. This is not having sexual intercourse or using a barrier method (condom, diaphragm, cervical cap) on days the woman could become pregnant.  Calendar method. This is keeping track of the length of each menstrual cycle and identifying when you are fertile.  Ovulation method. This is avoiding sexual intercourse during ovulation.  Symptothermal method.  This is avoiding sexual intercourse during ovulation, using a thermometer and ovulation symptoms.  Post-ovulation method. This is timing sexual intercourse after you have ovulated. Regardless of which type or method of contraception you choose, it is important that you use condoms to protect against the transmission of sexually transmitted infections (STIs). Talk with your health care provider about which form of contraception is most appropriate for you. This information is not intended to replace advice given to you by your health care provider. Make sure you discuss any questions you have with your health care provider. Document Released: 09/17/2005 Document Revised: 02/23/2016 Document Reviewed: 03/12/2013 Elsevier Interactive Patient Education  2017 [employer] Inc.  

## 2017-05-03 NOTE — MAU Note (Signed)
Patient states she had a miscarriage 2 weeks ago and needs a note for work saying she can come back to work.  No pain, no bleeding or discharge now.  Pt thinks she was 4-[redacted] weeks along when she "passed tissue" at home. LMP 2 months ago.

## 2017-05-03 NOTE — MAU Provider Note (Signed)
History     CSN: [id number]  Arrival date and time: 05/03/17 1029   First Provider Initiated Contact with Patient 05/03/17 1105      Chief Complaint  Patient presents with  . Miscarriage   Amanda Russell is a 21 y.o. [id number] who presents today for a doctor's note. She states that she had a +UPT about 4-5 weeks ago, and then she miscarried. She did not see a healthcare provider. However, she took 2 weeks off of work, and now her work is requiring a note saying she can return to work. She works at [employer]. She states that around July 6th she started having bleeding. She reports that she bled for about 4 days and she "had some globs of hard stuff come out". She reports that her mom told her she had a miscarriage. She missed work July 4, 5 and 6th. She was supposed to go back to work on 7/19, and they told her she had to have a doctor's note. She states that she has now missed the last 2 weeks of work as well. She denies any pain or bleeding today. She has had cramps off and on. Her menstrual cycle is due soon.    Past Medical History:  Diagnosis Date  . ADHD (attention deficit hyperactivity disorder), combined type 07/14/2012  . Chlamydia   . Suicidal ideation     Past Surgical History:  Procedure Laterality Date  . NO PAST SURGERIES      Family History  Problem Relation Age of Onset  . Cancer Neg Hx   . Diabetes Neg Hx   . Hypertension Neg Hx     Social History  Substance Use Topics  . Smoking status: Current Every Day Smoker    Packs/day: 0.50    Years: 0.80    Types: Cigarettes    Last attempt to quit: 01/07/2011  . Smokeless tobacco: Never Used  . Alcohol use Yes    Allergies: No Known Allergies  Prescriptions Prior to Admission  Medication Sig Dispense Refill Last Dose  . acetaminophen (TYLENOL) 500 MG tablet Take 1 tablet (500 mg total) by mouth every 6 (six) hours as needed. 30 tablet 0   . metroNIDAZOLE (FLAGYL) 500 MG tablet Take 1 tablet (500 mg  total) by mouth 2 (two) times daily. (Patient not taking: Reported on 09/04/2016) 14 tablet 0 Completed Course at Unknown time  . ondansetron (ZOFRAN ODT) 4 MG disintegrating tablet Take 1 tablet (4 mg total) by mouth every 8 (eight) hours as needed for nausea or vomiting. 20 tablet 0     Review of Systems  Constitutional: Negative for chills and fever.  Gastrointestinal: Negative for nausea and vomiting.  Genitourinary: Negative for pelvic pain and vaginal bleeding.   Physical Exam   Blood pressure 109/64, pulse 91, temperature 98.5 F (36.9 C), temperature source Oral, resp. rate 18, height 5\' 5"  (1.651 m), weight 136 lb 8 oz (61.9 kg), last menstrual period 03/03/2017, SpO2 100 %, unknown if currently breastfeeding.  Physical Exam  Nursing note and vitals reviewed. Constitutional: She is oriented to person, place, and time. She appears well-developed and well-nourished. No distress.  HENT:  Head: Normocephalic.  Cardiovascular: Normal rate.   Respiratory: Effort normal.  Neurological: She is alert and oriented to person, place, and time.  Psychiatric: She has a normal mood and affect.    Results for orders placed or performed during the hospital encounter of 05/03/17 (from the past 24 hour(s))  Pregnancy, urine  POC     Status: None   Collection Time: 05/03/17 10:54 AM  Result Value Ref Range   Preg Test, Ur NEGATIVE NEGATIVE    MAU Course  Procedures  MDM D/W the patient that I can provide a note for her that says she was here today. I cannot justify almost an entire month of missed work as I do not have a miscarriage documented, and UPT is negative today.   Assessment and Plan   1. Abnormal uterine bleeding (AUB)    DC home RX: none  Return to MAU as needed  Note given that states she was seen here today.   Follow-up Information    Department, [facility] Follow [facility].   Contact information: [address] [place] [address] [phone]             Thressa ShellerHeather Laura Caldas 05/03/2017, 11:06 AM

## 2017-05-26 ENCOUNTER — Encounter (HOSPITAL_COMMUNITY): Payer: Self-pay | Admitting: Emergency Medicine

## 2017-05-26 ENCOUNTER — Emergency Department (HOSPITAL_COMMUNITY)
Admission: EM | Admit: 2017-05-26 | Discharge: 2017-05-28 | Disposition: A | Discharge status: Home / Self Care | Payer: Medicaid Other | Attending: Emergency Medicine | Admitting: Emergency Medicine

## 2017-05-26 DIAGNOSIS — R440 Auditory hallucinations: Secondary | ICD-10-CM | POA: Insufficient documentation

## 2017-05-26 DIAGNOSIS — Z8659 Personal history of other mental and behavioral disorders: Secondary | ICD-10-CM | POA: Diagnosis not present

## 2017-05-26 DIAGNOSIS — F329 Major depressive disorder, single episode, unspecified: Secondary | ICD-10-CM | POA: Diagnosis present

## 2017-05-26 DIAGNOSIS — F17210 Nicotine dependence, cigarettes, uncomplicated: Secondary | ICD-10-CM | POA: Diagnosis not present

## 2017-05-26 DIAGNOSIS — F32A Depression, unspecified: Secondary | ICD-10-CM

## 2017-05-26 LAB — URINALYSIS, ROUTINE W REFLEX MICROSCOPIC
BILIRUBIN URINE: NEGATIVE
Glucose, UA: NEGATIVE mg/dL
KETONES UR: 5 mg/dL — AB
LEUKOCYTES UA: NEGATIVE
Nitrite: NEGATIVE
PROTEIN: 100 mg/dL — AB
Specific Gravity, Urine: 1.024 (ref 1.005–1.030)
pH: 7 (ref 5.0–8.0)

## 2017-05-26 LAB — CBC
HCT: 37 % (ref 36.0–46.0)
Hemoglobin: 13.1 g/dL (ref 12.0–15.0)
MCH: 29 pg (ref 26.0–34.0)
MCHC: 35.4 g/dL (ref 30.0–36.0)
MCV: 82 fL (ref 78.0–100.0)
Platelets: 189 10*3/uL (ref 150–400)
RBC: 4.51 MIL/uL (ref 3.87–5.11)
RDW: 12.1 % (ref 11.5–15.5)
WBC: 5.9 10*3/uL (ref 4.0–10.5)

## 2017-05-26 LAB — COMPREHENSIVE METABOLIC PANEL
ALBUMIN: 4.5 g/dL (ref 3.5–5.0)
ALT: 20 U/L (ref 14–54)
ANION GAP: 7 (ref 5–15)
AST: 22 U/L (ref 15–41)
Alkaline Phosphatase: 53 U/L (ref 38–126)
BUN: 7 mg/dL (ref 6–20)
CHLORIDE: 105 mmol/L (ref 101–111)
CO2: 24 mmol/L (ref 22–32)
CREATININE: 0.94 mg/dL (ref 0.44–1.00)
Calcium: 9.3 mg/dL (ref 8.9–10.3)
GFR calc non Af Amer: >60 mL/min (ref >60)
GLUCOSE: 105 mg/dL — AB (ref 65–99)
Potassium: 3 mmol/L — ABNORMAL LOW (ref 3.5–5.1)
SODIUM: 136 mmol/L (ref 135–145)
Total Bilirubin: 0.9 mg/dL (ref 0.3–1.2)
Total Protein: 7.7 g/dL (ref 6.5–8.1)

## 2017-05-26 LAB — SALICYLATE LEVEL

## 2017-05-26 LAB — RAPID URINE DRUG SCREEN, HOSP PERFORMED
Amphetamines: NOT DETECTED
Barbiturates: NOT DETECTED
Benzodiazepines: NOT DETECTED
Cocaine: NOT DETECTED
Opiates: NOT DETECTED
Tetrahydrocannabinol: POSITIVE — AB

## 2017-05-26 LAB — I-STAT BETA HCG BLOOD, ED (MC, WL, AP ONLY)

## 2017-05-26 LAB — ACETAMINOPHEN LEVEL

## 2017-05-26 LAB — ETHANOL: Alcohol, Ethyl (B): <5 mg/dL (ref <5)

## 2017-05-26 MED ORDER — ACETAMINOPHEN 325 MG PO TABS
650.0000 mg | ORAL_TABLET | ORAL | Status: DC | PRN
Start: 2017-05-26 — End: 2017-05-28
  Administered 2017-05-27 – 2017-05-28 (×2): 650 mg via ORAL
  Filled 2017-05-26 (×2): qty 2

## 2017-05-26 MED ORDER — ZOLPIDEM TARTRATE 5 MG PO TABS: 5.0000 mg | ORAL_TABLET | Freq: Every evening | ORAL | Status: DC | PRN

## 2017-05-26 MED ORDER — LORAZEPAM 1 MG PO TABS
1.0000 mg | ORAL_TABLET | Freq: Four times a day (QID) | ORAL | Status: DC | PRN
  Administered 2017-05-26 – 2017-05-28 (×4): 1 mg via ORAL
  Filled 2017-05-26 (×4): qty 1

## 2017-05-26 MED ORDER — ALUM & MAG HYDROXIDE-SIMETH 200-200-20 MG/5ML PO SUSP: 30.0000 mL | Freq: Four times a day (QID) | ORAL | Status: DC | PRN

## 2017-05-26 MED ORDER — POTASSIUM CHLORIDE CRYS ER 20 MEQ PO TBCR
40.0000 meq | EXTENDED_RELEASE_TABLET | Freq: Once | ORAL | Status: AC
  Administered 2017-05-26: 40 meq via ORAL
  Filled 2017-05-26: qty 2

## 2017-05-26 MED ORDER — ONDANSETRON HCL 4 MG PO TABS: 4.0000 mg | ORAL_TABLET | Freq: Three times a day (TID) | ORAL | Status: DC | PRN

## 2017-05-26 MED ORDER — NICOTINE 21 MG/24HR TD PT24
21.0000 mg | MEDICATED_PATCH | Freq: Every day | TRANSDERMAL | Status: DC
  Administered 2017-05-26 – 2017-05-27 (×2): 21 mg via TRANSDERMAL
  Filled 2017-05-26 (×3): qty 1

## 2017-05-26 NOTE — ED Provider Notes (Signed)
[facility]-EM[facility] DEPT Provider Note   CSN: [id number] Arrival date & time: 05/26/17  4098     History   Chief Complaint Chief Complaint  Patient presents with  . psychosis   HPI   Blood pressure 135/86, pulse (!) 111, temperature 98.3 F (36.8 C), resp. rate 18, SpO2 100 %, unknown if currently breastfeeding.  KEARSTYN AVITIA is a 21 y.o. female requesting to be evaluated for aids, she feels that she has aids because she sleeps with lot of men, she states that her friend's father has AIDS and he gave her a pill with which "open her up to the virus." She states that she is hearing voices and they are telling her it is the new Millennium, she is asking if it's true that his new Millennium. She denies any suicidal ideation, she states that she smokes marijuana and drinks occasionally and that she used to do [place] but hasn't done any [place] recently. She is here with a friend who accompanies her who states that she hasn't been doing drugs to his knowledge and states that this is atypical for her. She has a prior history of bipolar disorder with psychotic features she states that she is not taking any psychiatric medications. She denies any homicidal ideation,   Past Medical History:  Diagnosis Date  . ADHD (attention deficit hyperactivity disorder), combined type 07/14/2012  . Chlamydia   . Suicidal ideation     Patient Active Problem List   Diagnosis Date Noted  . Bipolar disorder with psychotic features (HCC) 08/06/2016  . Preterm premature rupture of membranes (PPROM) with unknown onset of labor 07/13/2014  . NVD (normal vaginal delivery) 07/13/2014  . Suicidal ideation 07/14/2012  . Depression 07/14/2012  . ADHD (attention deficit hyperactivity disorder), combined type 07/14/2012  . Oppositional defiant disorder 07/14/2012  . Parent-child relational problem 07/14/2012    Past Surgical History:  Procedure Laterality Date  . NO PAST SURGERIES      OB History    Gravida Para  Term Preterm AB Living   2 2 1 1   2    SAB TAB Ectopic Multiple Live Births           2       Home Medications    Prior to Admission medications   Medication Sig Start Date End Date Taking? Authorizing Provider  MedroxyPROGESTERone Acetate (DEPO-PROVERA) 150 MG/ML SUSY Inject 150 mg into the muscle every 3 (three) months.   Yes [provider]  acetaminophen (TYLENOL) 500 MG tablet Take 1 tablet (500 mg total) by mouth every 6 (six) hours as needed. Patient not taking: Reported on 05/26/2017 09/04/16   Marlon Pel, PA-C  ondansetron (ZOFRAN ODT) 4 MG disintegrating tablet Take 1 tablet (4 mg total) by mouth every 8 (eight) hours as needed for nausea or vomiting. Patient not taking: Reported on 05/26/2017 09/09/16   Deatra Canter, FNP    Family History Family History  Problem Relation Age of Onset  . Cancer Neg Hx   . Diabetes Neg Hx   . Hypertension Neg Hx     Social History Social History  Substance Use Topics  . Smoking status: Current Every Day Smoker    Packs/day: 0.50    Years: 0.80    Types: Cigarettes    Last attempt to quit: 01/07/2011  . Smokeless tobacco: Never Used  . Alcohol use Yes     Allergies   Patient has no known allergies.   Review of Systems Review of  Systems  A complete review of systems was obtained and all systems are negative except as noted in the HPI and PMH.   Physical Exam Updated Vital Signs BP 105/66 (BP Location: Right Arm)   Pulse 77   Temp 98.3 F (36.8 C)   Resp 18   SpO2 99%   Physical Exam  Constitutional: She is oriented to person, place, and time. She appears well-developed and well-nourished. No distress.  HENT:  Head: Normocephalic and atraumatic.  Mouth/Throat: Oropharynx is clear and moist.  Eyes: Pupils are equal, round, and reactive to light. Conjunctivae and EOM are normal.  Neck: Normal range of motion.  Cardiovascular: Normal rate, regular rhythm and intact distal pulses.   Pulmonary/Chest:  Effort normal and breath sounds normal. No respiratory distress. She has no wheezes. She has no rales. She exhibits no tenderness.  Abdominal: Soft. She exhibits no distension and no mass. There is no tenderness. There is no rebound and no guarding. No hernia.  Musculoskeletal: Normal range of motion.  Neurological: She is alert and oriented to person, place, and time.  Skin: Capillary refill takes less than 2 seconds. She is not diaphoretic.  Psychiatric:  Flat affect, Responding to internal stimuli, paranoid  Nursing note and vitals reviewed.    ED Treatments / Results  Labs (all labs ordered are listed, but only abnormal results are displayed) Labs Reviewed  COMPREHENSIVE METABOLIC PANEL - Abnormal; Notable for the following:       Result Value   Potassium 3.0 (*)    Glucose, Bld 105 (*)    All other components within normal limits  ACETAMINOPHEN LEVEL - Abnormal; Notable for the following:    Acetaminophen (Tylenol), Serum <10 (*)    All other components within normal limits  ETHANOL  CBC  SALICYLATE LEVEL  RAPID URINE DRUG SCREEN, HOSP PERFORMED  I-STAT BETA HCG BLOOD, ED ([facility], [facility], [facility] ONLY)    EKG  EKG Interpretation None       Radiology No results found.  Procedures Procedures (including critical care time)  Medications Ordered in ED Medications  acetaminophen (TYLENOL) tablet 650 mg (not administered)  zolpidem (AMBIEN) tablet 5 mg (not administered)  ondansetron (ZOFRAN) tablet 4 mg (not administered)  alum & mag hydroxide-simeth (MAALOX/MYLANTA) 200-200-20 MG/5ML suspension 30 mL (not administered)  nicotine (NICODERM CQ - dosed in mg/24 hours) patch 21 mg (21 mg Transdermal Patch Applied 05/26/17 1304)  potassium chloride SA (K-DUR,KLOR-CON) CR tablet 40 mEq (40 mEq Oral Given 05/26/17 1304)     Initial Impression / Assessment and Plan / ED Course  I have reviewed the triage vital signs and the nursing notes.  Pertinent labs & imaging results that were  available during my care of the patient were reviewed by me and considered in my medical decision making (see chart for details).     Vitals:   05/26/17 0801 05/26/17 1535  BP: 135/86 105/66  Pulse: (!) 111 77  Resp: 18 18  Temp: 98.3 F (36.8 C) 98.3 F (36.8 C)  SpO2: 100% 99%    Medications  acetaminophen (TYLENOL) tablet 650 mg (not administered)  zolpidem (AMBIEN) tablet 5 mg (not administered)  ondansetron (ZOFRAN) tablet 4 mg (not administered)  alum & mag hydroxide-simeth (MAALOX/MYLANTA) 200-200-20 MG/5ML suspension 30 mL (not administered)  nicotine (NICODERM CQ - dosed in mg/24 hours) patch 21 mg (21 mg Transdermal Patch Applied 05/26/17 1304)  potassium chloride SA (K-DUR,KLOR-CON) CR tablet 40 mEq (40 mEq Oral Given 05/26/17 1304)  YOUA DELONEY is 21 y.o. female presenting with auditory hallucinations, she appears to be responding to internal stimuli, she has some paranoid ideation thinking that her friend's father is poisoning her. She does have a history of bipolar with psychotic features, she does not appear to be compliant with her psychiatric medications, I think she is a danger to herself, will need inpatient psychiatric care. 24-hour hold paperwork submitted. She is here voluntarily. Patient is accompanied by a friend, the friend has left the emergency department to smoke and when he returns he smells distinctly of marijuana he also has a 24-year-old child called Inez Pilgrim who is this patient's son. This patient will need inpatient psychiatric care, I am uncomfortable letting the child go with the friend who is clearly intoxicated. We have attempted to contact this pateint's mother Rene Kocher, who will come to pick up the grandchild.    Final Clinical Impressions(s) / ED Diagnoses   Final diagnoses:  None    New Prescriptions New Prescriptions   No medications on file     Kaylyn Lim 05/26/17 1607    Tilden Fossa, MD 05/27/17 [id number]

## 2017-05-26 NOTE — ED Notes (Addendum)
Snack given, Breakfast order collected

## 2017-05-26 NOTE — ED Notes (Addendum)
Regular Diet ordered for Dinner. 

## 2017-05-26 NOTE — ED Notes (Addendum)
Lab Tech will add u/a to UDS. Pt c/o lower mid abd pain - states feels like UTI. Declined meds for pain. States "I just want to make sure I don't have a bladder infection".

## 2017-05-26 NOTE — ED Notes (Addendum)
Pt walked into a different room I was triaging, undressed. Asked to go back into her room.  Looking at patients chart I see a recent episode where she brought a random baby, not her own, with her to ER and was unable to state whose baby I was? CSW and [employer] were involved.

## 2017-05-26 NOTE — ED Notes (Addendum)
Pt noted to be crying - states "I feel like I fucked the wrong man. He didn't look like a man but he had a penis. He brought me [employer] and everything!" Also states every time she closes her eyes, she envisions her uncle being shot. Allowed pt to vent her feelings/concerns. States "I feel better now after I cry". States abd pain has resolved.

## 2017-05-26 NOTE — ED Notes (Signed)
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

## 2017-05-26 NOTE — ED Notes (Signed)
Noted to be very anxious crying out at times.  Order obtained for ativan.  Encouraged to take.  Sitter remains within arms length. Encouraged to call for assistance as needed.

## 2017-05-26 NOTE — ED Notes (Addendum)
Pt ambulatory to F8 - wearing burgundy scrubs. Pt's belongings inventoried - 1 labeled belongings bag in [place] #3 and valuables w/security. Pt given Medical Clearance Pt Policy form. Warm blankets given as requested and pt states she wants to sleep. Pt's 24-Hour IVC paperwork placed on clipboard - original placed in folder for Magistrate.

## 2017-05-26 NOTE — BH Assessment (Signed)
Tele Assessment Note   Patient Name: Amanda Russell MRN: [id number] Referring Physician: Wynetta Emery, PA-C Location of Patient: [facility] Location of Provider: Behavioral Health TTS Department  Amanda Russell is an 21 y.o. female.   Pt presents voluntarily to [facility] ED. She is cooperative and oriented to self, place and year. Pt doesn't know month of year. When asked about her reason for presenting to [facility], pt says, "I just feel like I'm ugly." She reports worrying that she is "mixed" as her grandfather had a white parent. She is a poor historian as she doesn't answer several questions. Pt exhibits looseness of association and pt is delusional. 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 Per chart review, Nov 2017 pt thought she had been given an injection of HIV from one of the EDs. When asked if she is suicidal, pt says, "No. I feel like the person is locked up downtown." Pt sts she is referring to a family member. Pt pauses a few second prior to answering questions. She may be responding to internal stimuli. 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 ADHD (attention deficit hyperactivity disorder), combined type 07/14/2012  . Chlamydia   . Suicidal ideation     Past Surgical History:  Procedure Laterality Date  . NO PAST SURGERIES      Family History:  Family History  Problem Relation Age of Onset  . Cancer Neg Hx   . Diabetes Neg Hx   . Hypertension Neg Hx     Social History:  reports that she has been smoking Cigarettes.  She has a 0.40 pack-year smoking history. She has never used smokeless tobacco. She reports that she drinks alcohol. She reports that she does not use drugs.  Additional Social History:  Alcohol / Drug Use Pain Medications: pt denies abuse - see  pta meds list Prescriptions: pt denies abuse - see pta meds list Over the Counter: pt denies abuse - see pta meds list History of alcohol / drug use?: Yes Substance #1 Name of Substance 1: etoh 1 - Frequency: occasionally 1 - Last Use / Amount: unknown  CIWA: CIWA-Ar BP: 135/86 Pulse Rate: (!) 111 COWS:    PATIENT STRENGTHS: (choose at least two) Average or above average intelligence Physical Health  Allergies: No Known Allergies  Home Medications:  (Not in a hospital admission)  OB/GYN Status:  No LMP recorded.  General Assessment Data Location of Assessment: [facility] ED TTS Assessment: In system Is this a Tele or Face-to-Face Assessment?: Tele Assessment Is this an Initial Assessment or a Re-assessment for this encounter?: Initial Assessment Marital status: Single Maiden name: none Is patient pregnant?: Unknown Pregnancy Status: Unknown Living Arrangements: Alone Can pt return to current living arrangement?: Yes Admission Status: Voluntary Is patient capable of signing voluntary admission?: Yes Referral Source: Self/Family/Friend Insurance type: medicaid     Crisis Care Plan Living Arrangements: Alone Name of Psychiatrist: none Name of Therapist: none  Education Status Is patient currently in school?: No Highest grade of school patient has completed: 12 Name of school: [employer]  Risk to self with the past 6 months Suicidal Ideation: No Has patient been a risk to self within the past 6 months prior to admission? : No Suicidal Intent: No Has patient had  any suicidal intent within the past 6 months prior to admission? : No Is patient at risk for suicide?: No Suicidal Plan?: No Has patient had any suicidal plan within the past 6 months prior to admission? : No What has been your use of drugs/alcohol within the last 12 months?: occasional etoh use Previous Attempts/Gestures: Yes How many times?: 4 Other Self Harm Risks: none Triggers for Past Attempts:  Unknown Intentional Self Injurious Behavior: None Family Suicide History: Unable to assess Recent stressful life event(s):  (pt sts having AIDS and being ugly) Persecutory voices/beliefs?: No Depression: No Depression Symptoms:  (unable to assess) Substance abuse history and/or treatment for substance abuse?: No Suicide prevention information given to non-admitted patients: Not applicable  Risk to Others within the past 6 months Homicidal Ideation: No Does patient have any lifetime risk of violence toward others beyond the six months prior to admission? : No Thoughts of Harm to Others: No Current Homicidal Intent: No Current Homicidal Plan: No Access to Homicidal Means: No Identified Victim: n/a History of harm to others?: No Assessment of Violence: None Noted Violent Behavior Description: pt denies hx violence Does patient have access to weapons?: No Criminal Charges Pending?: No Does patient have a court date: No Is patient on probation?: No  Psychosis Hallucinations: None noted Delusions: Somatic, Unspecified  Mental Status Report Appearance/Hygiene: Unremarkable Eye Contact: Fair Motor Activity: Freedom of movement Speech: Logical/coherent, Tangential Level of Consciousness: Alert Mood: Fearful, Anxious Affect: Blunted Anxiety Level: Moderate Thought Processes: Tangential (looseness of association) Judgement: Impaired Orientation: Person, Place, Time Obsessive Compulsive Thoughts/Behaviors: Unable to Assess  Cognitive Functioning Concentration: Unable to Assess Memory: Unable to Assess IQ: Average Insight: Poor Impulse Control: Fair Appetite: Fair Sleep: No Change Vegetative Symptoms: Unable to Assess  ADLScreening [facility] Services) Patient's cognitive ability adequate to safely complete daily activities?: Yes Patient able to express need for assistance with ADLs?: Yes Independently performs ADLs?: Yes (appropriate for developmental age)  Prior  Inpatient Therapy Prior Inpatient Therapy: Yes Prior Therapy Dates: 2013, 2017  Prior Therapy Facilty/Provider(s): [facility], [facility] Reason for Treatment: SI, psychosis  Prior Outpatient Therapy Prior Outpatient Therapy:  (unable to assess) Does patient have an ACCT team?: Unknown Does patient have Intensive In-House Services?  : No Does patient have [place] services? :  (unable to assess) Does patient have P4CC services?:  (unable to assess)  ADL Screening (condition at time of admission) Patient's cognitive ability adequate to safely complete daily activities?: Yes Is the patient deaf or have difficulty hearing?: No Does the patient have difficulty seeing, even when wearing glasses/contacts?: No Does the patient have difficulty concentrating, remembering, or making decisions?: Yes Patient able to express need for assistance with ADLs?: Yes Does the patient have difficulty dressing or bathing?: No Independently performs ADLs?: Yes (appropriate for developmental age) Does the patient have difficulty walking or climbing stairs?: No Weakness of Legs: None Weakness of Arms/Hands: None  Home Assistive Devices/Equipment Home Assistive Devices/Equipment: None    Abuse/Neglect Assessment (Assessment to be complete while patient is alone) Physical Abuse:  (unable to assess) Verbal Abuse:  (unable to assess) Sexual Abuse:  (unable to assess) Exploitation of patient/patient's resources:  (unable to assess) Self-Neglect:  (unable to assess)     Advance Directives (For Healthcare) Does Patient Have a Medical Advance Directive?: No Would patient like information on creating a medical advance directive?: No - Patient declined    Additional Information 1:1 In Past 12 Months?:  (unable to assess) CIRT Risk: No  Elopement Risk: Yes Does patient have medical clearance?: Yes   Disposition:  Disposition Initial Assessment Completed for this Encounter: Yes Disposition of Patient:  Inpatient treatment program Type of inpatient treatment program: Adult (tina okonkwo np recommends inpatient treatment)  This service was provided via telemedicine using a 2-way, interactive audio and Immunologist.  Names of all persons participating in this telemedicine service and their role in this encounter. Name: Anell Barr Pt's RN  Berenice Bouton who was present during teleassessmt  [place], [place], [place] Role: ED provider who ordered consult  Name:  Role:     Thornell Sartorius 05/26/2017 12:57 PM

## 2017-05-26 NOTE — ED Triage Notes (Addendum)
Pt all over the place at triage. Pt checked in for abdominal pain. Denies abdominal pain in triage. Pt states "I think I have HIV." When asked why, pt states "I have so many sex partners, I have scars (pt pulled her shirt down and exposed her chest. Pointing at stretch marks to her breast and now at her groin, pointing at stretch mark from pregnancy. Pt states "I know I have HIV because I am ugly and people think I am ugly." Pt states "does it feel like im pregnant?" Pt unable to speak to last period. Pt appears to be under the influence? PT stared at me without answer when I asked when lmp was. Denies pain anywhere.

## 2017-05-26 NOTE — ED Notes (Signed)
Pt's friend returned to ED smelling strongly of weed, slow speech, eyes reddened with pt's son with him, pt's mother notified Rene Kocher -- [phone]) will come and pick up child and friend.

## 2017-05-26 NOTE — ED Notes (Signed)
Clothing inventoried, cell phone placed in safe in security.

## 2017-05-26 NOTE — ED Notes (Addendum)
Pt. Continuing to voice to sitter that she (the patient)  has HIV, pt crying at this moment.

## 2017-05-26 NOTE — Progress Notes (Addendum)
Patient meets inpatient treatment criteria, per Leighton Ruff, NP, on 05/2617. Patient is voluntary.  Patient has been referred to the following inpatient treatment psychiatric facilities: [address], [place], [place], [address], [address] [place] [address], and [place].  CSW in disposition will continue to seek placement.  Melbourne Abts, MSW, LCSWA Clinical social worker in disposition [facility], TTS Office 05/26/2017 3:35 PM

## 2017-05-26 NOTE — ED Notes (Signed)
Pt getting undressed in triage. Asked her to put her clothes back on.

## 2017-05-26 NOTE — ED Notes (Signed)
Pt now awake - asking about her lab results. Advised pt of need for urine specimen - cup placed on table - voiced understanding. Pt given Caff-Free Coke, crackers, peanut butter as requested.

## 2017-05-26 NOTE — ED Notes (Signed)
Pt's mother picked up child.

## 2017-05-27 ENCOUNTER — Inpatient Hospital Stay (HOSPITAL_COMMUNITY): Admission: AD | Admit: 2017-05-27 | Payer: Medicaid Other | Admitting: Psychiatry

## 2017-05-27 MED ORDER — ARIPIPRAZOLE 5 MG PO TABS
2.5000 mg | ORAL_TABLET | Freq: Two times a day (BID) | ORAL | Status: DC
  Administered 2017-05-27 – 2017-05-28 (×3): 2.5 mg via ORAL
  Filled 2017-05-27 (×3): qty 1

## 2017-05-27 NOTE — ED Triage Notes (Signed)
TC from [facility]  Requesting  IVC papers  For PT. The 24 Hr hold expires at 10:00 A

## 2017-05-27 NOTE — Progress Notes (Signed)
AC updated Disposition CSW that there will be no female beds as anticipated.   Per Malva Limes, [facility], patient cannot come to [facility], no appropriate beds on 05/27/17.   Celine Ahr, RN notified.    Baldo Daub MSW, LCSWA CSW Disposition [phone]

## 2017-05-27 NOTE — Progress Notes (Signed)
Pt continues to meet inpatient criteria per Ferne Reus, NP.    CSW refaxing referral to the following:  [facility],  [place],  [place] Orlie Pollen   Disposition CSW's will continue to seek placement.  Timmothy Euler. Kaylyn Lim, MSW, LCSWA Disposition Clinical Social Work [phone] (cell) [phone] (office)

## 2017-05-27 NOTE — ED Notes (Signed)
Pt denied wanting a snack or drink.

## 2017-05-27 NOTE — ED Triage Notes (Signed)
Pt continues to cry and Pt reports I do not remember why I am here. Pt reports I did not ask for all this extra stuff. Pt reports her first day of school is Tuesday and she will be dropped from the class if she misses the class.

## 2017-05-27 NOTE — ED Notes (Signed)
Pt at the desk asking if she will be discharged tonight, voiced concern that if she misses class tomorrow she will be dropped from her program at [facility]. Pt informed that she will be here for the night. Pt voiced understanding, asked for something for anxiety.

## 2017-05-27 NOTE — Progress Notes (Signed)
Per Malva Limes , [facility], patient has been accepted to [facility], bed 503-1 ; Accepting provider is Ferne Reus, NP; Attending provider is Dr. Elna Breslow.  Malva Limes, [facility] will notify RN when patient can arrive. Number for report is [phone].  Per Miki Kins, Celine Ahr, RN notified.   Baldo Daub MSW, LCSWA CSW Disposition [phone]

## 2017-05-27 NOTE — ED Triage Notes (Signed)
Pt tearful after talking with EDP .

## 2017-05-27 NOTE — ED Triage Notes (Addendum)
EDP at bed side to asses PT.

## 2017-05-27 NOTE — BH Assessment (Signed)
[facility] Assessment Progress Note    Pt reassessed this morning. She presents as pleasant, calm and cooperative. Pt denies SI, HI, AVH. Pt speaks with slightly tangential speech. She says she came to the ED b/c she has low self esteem and she couldn't sleep or eat. She also shares that someone on the "street" said she looks like she's sick. IP treatment continues to be recommended at this time as pt displayed psychotic behaviors w/in the last 24 hours.  Johny Shock. Ladona Ridgel, MS, NCC, LPCA Counselor

## 2017-05-27 NOTE — ED Notes (Signed)
Lunch meal ordered @ 0812. 

## 2017-05-28 NOTE — ED Notes (Signed)
Patient refused a snack, and  A regular diet was ordered for Lunch.

## 2017-05-28 NOTE — ED Provider Notes (Signed)
12:24-evaluation to assess for stability for discharge.  She is calm and comfortable.  She denies homicidal or suicidal ideation.  She agrees to go to intensive outpatient treatment, for her counseling.   Mancel Bale, MD 05/30/17 1440

## 2017-05-28 NOTE — ED Notes (Signed)
Pt recommended for DC per [facility] counselor

## 2017-05-28 NOTE — ED Notes (Signed)
Breakfast ordered 

## 2017-05-28 NOTE — ED Notes (Signed)
TTS in process at this time  

## 2017-05-28 NOTE — ED Notes (Signed)
Pt asking to leave; BH notified to reeval pt today; pt is agitated and wanting to leave; explained to pt needed to wait for reeval

## 2017-05-28 NOTE — ED Notes (Signed)
Dr. Effie Shy aware of recommendation for discharge and reports he will come see the pt.
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
## 2017-05-28 NOTE — Progress Notes (Signed)
Per Ferne Reus, NP, patient does not meet criteria for inpatient treatment. Patient is recommended for discharge and to follow up outpatient.    Chelsea Felts, RN notified.    Baldo Daub MSW, LCSWA CSW Disposition [phone]

## 2017-05-28 NOTE — ED Notes (Signed)
PT AVS reviewed and belongings given to pt. Pt provided with phone numbers and resource packet on how to follow up out pt. Pt denies HI/SI but requesting a paper stating she does not have HIV. I instructed pt to refer to mychart or contact medical records. PT verbalizes understanding and reports she will call a ride.

## 2017-05-28 NOTE — ED Notes (Signed)
Pt up eating breakfast  

## 2017-06-05 ENCOUNTER — Emergency Department (HOSPITAL_COMMUNITY)
Admission: EM | Admit: 2017-06-05 | Discharge: 2017-06-05 | Disposition: A | Discharge status: Home / Self Care | Payer: Medicaid Other | Attending: Emergency Medicine | Admitting: Emergency Medicine

## 2017-06-05 DIAGNOSIS — Z5321 Procedure and treatment not carried out due to patient leaving prior to being seen by health care provider: Secondary | ICD-10-CM | POA: Insufficient documentation

## 2017-06-05 DIAGNOSIS — R109 Unspecified abdominal pain: Secondary | ICD-10-CM | POA: Diagnosis present

## 2017-06-05 NOTE — ED Notes (Signed)
Called patient to be triage and no answer. 

## 2017-06-05 NOTE — ED Notes (Signed)
Called pt's name to be triaged, but no response from the waiting room. 

## 2017-06-09 ENCOUNTER — Emergency Department (HOSPITAL_COMMUNITY)
Admission: EM | Admit: 2017-06-09 | Discharge: 2017-06-10 | Disposition: A | Discharge status: Home / Self Care | Payer: Medicaid Other | Attending: Emergency Medicine | Admitting: Emergency Medicine

## 2017-06-09 ENCOUNTER — Encounter (HOSPITAL_COMMUNITY): Payer: Self-pay | Admitting: Emergency Medicine

## 2017-06-09 DIAGNOSIS — F909 Attention-deficit hyperactivity disorder, unspecified type: Secondary | ICD-10-CM | POA: Insufficient documentation

## 2017-06-09 DIAGNOSIS — F17210 Nicotine dependence, cigarettes, uncomplicated: Secondary | ICD-10-CM | POA: Insufficient documentation

## 2017-06-09 DIAGNOSIS — F12150 Cannabis abuse with psychotic disorder with delusions: Secondary | ICD-10-CM | POA: Diagnosis not present

## 2017-06-09 DIAGNOSIS — R443 Hallucinations, unspecified: Secondary | ICD-10-CM | POA: Diagnosis present

## 2017-06-09 DIAGNOSIS — F209 Schizophrenia, unspecified: Secondary | ICD-10-CM | POA: Diagnosis not present

## 2017-06-09 DIAGNOSIS — Z79899 Other long term (current) drug therapy: Secondary | ICD-10-CM | POA: Diagnosis not present

## 2017-06-09 DIAGNOSIS — F1910 Other psychoactive substance abuse, uncomplicated: Secondary | ICD-10-CM | POA: Diagnosis not present

## 2017-06-09 DIAGNOSIS — R45851 Suicidal ideations: Secondary | ICD-10-CM | POA: Insufficient documentation

## 2017-06-09 LAB — COMPREHENSIVE METABOLIC PANEL WITH GFR
ALT: 22 U/L (ref 14–54)
AST: 19 U/L (ref 15–41)
Albumin: 4.4 g/dL (ref 3.5–5.0)
Alkaline Phosphatase: 46 U/L (ref 38–126)
Anion gap: 7 (ref 5–15)
BUN: 6 mg/dL (ref 6–20)
CO2: 25 mmol/L (ref 22–32)
Calcium: 8.9 mg/dL (ref 8.9–10.3)
Chloride: 106 mmol/L (ref 101–111)
Creatinine, Ser: 0.75 mg/dL (ref 0.44–1.00)
GFR calc Af Amer: >60 mL/min
GFR calc non Af Amer: >60 mL/min
Glucose, Bld: 104 mg/dL — ABNORMAL HIGH (ref 65–99)
Potassium: 3.2 mmol/L — ABNORMAL LOW (ref 3.5–5.1)
Sodium: 138 mmol/L (ref 135–145)
Total Bilirubin: 0.8 mg/dL (ref 0.3–1.2)
Total Protein: 7.2 g/dL (ref 6.5–8.1)

## 2017-06-09 LAB — RAPID URINE DRUG SCREEN, HOSP PERFORMED
AMPHETAMINES: NOT DETECTED
BARBITURATES: NOT DETECTED
BENZODIAZEPINES: NOT DETECTED
COCAINE: NOT DETECTED
OPIATES: NOT DETECTED
TETRAHYDROCANNABINOL: POSITIVE — AB

## 2017-06-09 LAB — CBC
HCT: 35.8 % — ABNORMAL LOW (ref 36.0–46.0)
Hemoglobin: 12.7 g/dL (ref 12.0–15.0)
MCH: 29 pg (ref 26.0–34.0)
MCHC: 35.5 g/dL (ref 30.0–36.0)
MCV: 81.7 fL (ref 78.0–100.0)
Platelets: 173 K/uL (ref 150–400)
RBC: 4.38 MIL/uL (ref 3.87–5.11)
RDW: 12.2 % (ref 11.5–15.5)
WBC: 6.5 K/uL (ref 4.0–10.5)

## 2017-06-09 LAB — ACETAMINOPHEN LEVEL: Acetaminophen (Tylenol), Serum: <10 ug/mL — ABNORMAL LOW (ref 10–30)

## 2017-06-09 LAB — SALICYLATE LEVEL

## 2017-06-09 LAB — ETHANOL: Alcohol, Ethyl (B): <5 mg/dL (ref <5)

## 2017-06-09 NOTE — ED Notes (Signed)
Angel in Lab notified that lab draw needed.

## 2017-06-09 NOTE — ED Notes (Signed)
[id number] Expected date:  Expected time:  Means of arrival:  Comments: Manson PasseyBrown

## 2017-06-09 NOTE — ED Provider Notes (Signed)
[facility]-EMERGENCY DEPT Provider Note   CSN: [id number] Arrival date & time: 06/09/17  1458     History   Chief Complaint Chief Complaint  Patient presents with  . Suicidal  . Hallucinations    HPI Amanda Russell is a 21 y.o. female who presents to the emergency department with a chief complaint of " I think I have HIV". She also complains of depression because "she thinks the whole city just keeps trying to dig up my background".   Nursing staff indicates the patient was brought in by [facility] and reports the patient drove to  [facility] and was rambling on and not making sense.  She reports that she was most recently sexually active with a female partner who had recently been released from jail who was HIV positive. She states that she thinks that she is HIV positive because she feels as if her skin is getting darker from all of the other races inside of me".   She denies SI, having a plan, HI, or auditory and visual hallucinations. She states that she really wants to get established with a counselor because she keeps having these triggers and these notions that she's been feeling recently.   She states that she smoked K2 once approximately one week ago. She smokes 5-10 cigarettes per day and marijuana occasionally.   Past medical history includes schizoaffective disorder and ODD. She denies taking any daily medications. Most recently hospitalization from 10/13-18/2013 for postpartum depression, ADHD, and parent child relation problem and was discharged to a detention center.   The history is provided by the patient. No language interpreter was used.    Past Medical History:  Diagnosis Date  . ADHD (attention deficit hyperactivity disorder), combined type 07/14/2012  . Chlamydia   . Suicidal ideation     Patient Active Problem List   Diagnosis Date Noted  . Bipolar disorder with psychotic features (HCC) 08/06/2016  . Preterm premature rupture of membranes (PPROM) with unknown  onset of labor 07/13/2014  . NVD (normal vaginal delivery) 07/13/2014  . Suicidal ideation 07/14/2012  . Depression 07/14/2012  . ADHD (attention deficit hyperactivity disorder), combined type 07/14/2012  . Oppositional defiant disorder 07/14/2012  . Parent-child relational problem 07/14/2012    Past Surgical History:  Procedure Laterality Date  . NO PAST SURGERIES      OB History    Gravida Para Term Preterm AB Living   SAB TAB Ectopic Multiple Live Births           2       Home Medications    Prior to Admission medications   Medication Sig Start Date End Date Taking? Authorizing Provider  MedroxyPROGESTERone Acetate (DEPO-PROVERA) 150 MG/ML SUSY Inject 150 mg into the muscle every 3 (three) months.   Yes [provider]  acetaminophen (TYLENOL) 500 MG tablet Take 1 tablet (500 mg total) by mouth every 6 (six) hours as needed. Patient not taking: Reported on 05/26/2017 09/04/16   Marlon Pel, PA-C  ondansetron (ZOFRAN ODT) 4 MG disintegrating tablet Take 1 tablet (4 mg total) by mouth every 8 (eight) hours as needed for nausea or vomiting. Patient not taking: Reported on 05/26/2017 09/09/16   Deatra Canter, FNP    Family History Family History  Problem Relation Age of Onset  . Cancer Neg Hx   . Diabetes Neg Hx   . Hypertension Neg Hx     Social History Social History  Substance Use Topics  . Smoking status: Current Every Day Smoker    Packs/day: 0.50    Years: 0.80    Types: Cigarettes    Last attempt to quit: 01/07/2011  . Smokeless tobacco: Never Used  . Alcohol use Yes     Allergies   Patient has no known allergies.   Review of Systems Review of Systems  Constitutional: Negative for activity change.  Respiratory: Negative for shortness of breath.   Cardiovascular: Negative for chest pain.  Gastrointestinal: Negative for abdominal pain.  Musculoskeletal: Negative for back pain.  Skin: Negative for rash.    Psychiatric/Behavioral: Positive for agitation and dysphoric mood. Negative for hallucinations and self-injury. The patient is nervous/anxious.      Physical Exam Updated Vital Signs BP (!) 100/56 (BP Location: Right Arm)   Pulse 62   Temp 98.7 F (37.1 C) (Oral)   Resp 17   SpO2 100%   Physical Exam  Constitutional: No distress.  HENT:  Head: Normocephalic.  Eyes: Conjunctivae are normal.  Neck: Neck supple.  Cardiovascular: Normal rate, regular rhythm and normal heart sounds.  Exam reveals no gallop and no friction rub.   No murmur heard. Pulmonary/Chest: Effort normal. No respiratory distress. She has no wheezes. She has no rales.  Abdominal: Soft. She exhibits no distension and no mass. There is no tenderness. There is no guarding.  Neurological: She is alert.  Skin: Skin is warm. No rash noted.  Psychiatric: Her affect is inappropriate. She is agitated. She is not actively hallucinating. Thought content is delusional. She expresses inappropriate judgment. She expresses no homicidal and no suicidal ideation. She expresses no suicidal plans and no homicidal plans. She is inattentive.  Nursing note and vitals reviewed.    ED Treatments / Results  Labs (all labs ordered are listed, but only abnormal results are displayed) Labs Reviewed  COMPREHENSIVE METABOLIC PANEL - Abnormal; Notable for the following:       Result Value   Potassium 3.2 (*)    Glucose, Bld 104 (*)    All other components within normal limits  ACETAMINOPHEN LEVEL - Abnormal; Notable for the following:    Acetaminophen (Tylenol), Serum <10 (*)    All other components within normal limits  CBC - Abnormal; Notable for the following:    HCT 35.8 (*)    All other components within normal limits  RAPID URINE DRUG SCREEN, HOSP PERFORMED - Abnormal; Notable for the following:    Tetrahydrocannabinol POSITIVE (*)    All other components within normal limits  ETHANOL  SALICYLATE LEVEL  I-STAT BETA HCG  BLOOD, ED ([facility], [facility], [facility] ONLY)    EKG  EKG Interpretation None       Radiology No results found.  Procedures Procedures (including critical care time)  Medications Ordered in ED Medications - No data to display   Initial Impression / Assessment and Plan / ED Course  I have reviewed the triage vital signs and the nursing notes.  Pertinent labs & imaging results that were available during my care of the patient were reviewed by me and considered in my medical decision making (see chart for details).     21 year old female presenting via [employer] with a delusional fixation on being diagnosed with HIV and disorganized thinking. No medical complaints at this time. The patient had a negative HIV test during her last ED visit earlier this month. The patient is medically cleared. Consulted TTS who recommended an AM psych evaluation. Patient care transferred to PA  Upstill at the end of my shift. Patient presentation, ED course, and plan of care discussed with review of all pertinent labs and imaging. Please see his/her note for further details regarding further ED course and disposition.   Final Clinical Impressions(s) / ED Diagnoses   Final diagnoses:  Schizophrenia spectrum disorder with psychotic disorder type not yet determined    New Prescriptions New Prescriptions   No medications on file     Barkley BoardsMcDonald, Natnael Biederman A, PA-C 06/10/17 0052    Tilden Fossaees, Elizabeth, MD 06/11/17 1329

## 2017-06-09 NOTE — ED Triage Notes (Signed)
Patient brought in by [employer]. [facility]ambling on not making sense. Here reporting that she smoked K2. "I really am going crazy". Hallucinating.
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
## 2017-06-09 NOTE — ED Notes (Signed)
Bed: WA31 Expected date:  Expected time:  Means of arrival:  Comments: 

## 2017-06-10 DIAGNOSIS — F17210 Nicotine dependence, cigarettes, uncomplicated: Secondary | ICD-10-CM

## 2017-06-10 DIAGNOSIS — F12150 Cannabis abuse with psychotic disorder with delusions: Secondary | ICD-10-CM | POA: Diagnosis not present

## 2017-06-10 DIAGNOSIS — F1910 Other psychoactive substance abuse, uncomplicated: Secondary | ICD-10-CM | POA: Diagnosis not present

## 2017-06-10 NOTE — BH Assessment (Signed)
[facility] Assessment Progress Note  Per Thedore MinsMojeed Akintayo, MD, this pt does not require psychiatric hospitalization at this time.  [facility] referral information for area substance abuse treatment providers.  This has been included in pt's discharge instructions.  Pt's nurse, Rayfield CitizenCaroline, has been notified.  Doylene Canninghomas Jermanie Minshall, MA Triage Specialist [phone]

## 2017-06-10 NOTE — ED Notes (Signed)
Patient has been up to nurse's station frequently.  She is focused on discharge.  She states, "I just did some drugs.  I'm coming down off of it.  I just need to go home."  She attempted to use the phone and was informed she could use it at 1000 after she is seen by MD and NP.  Patient states she has a 20 year old son at home.  Patient also states, "I need to talk to the police and get my phone back.  They trying to play me."  Patient is anxious and states she has no thoughts of self harm.  She states, "I felt like I smoked something plastic."  She was positive for THC.

## 2017-06-10 NOTE — ED Notes (Signed)
SBAR Report received from previous nurse. Pt received calm and visible on unit. Pt denies current SI/ HI, A/V H, S, HI or pain at this time, and appears otherwise stable and free of distress. Pt reminded of camera surveillance, q 15 min rounds, and rules of the milieu. Will continue to assess.

## 2017-06-10 NOTE — Consult Note (Signed)
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 Preterm premature rupture of membranes (PPROM) with unknown onset of labor [O42.919] 07/13/2014  . NVD (normal vaginal delivery) [O80] 07/13/2014  . Suicidal ideation [R45.851] 07/14/2012  . Depression [F32.9] 07/14/2012  . ADHD (attention deficit hyperactivity disorder), combined type [F90.2] 07/14/2012  . Oppositional defiant disorder [F91.3] 07/14/2012  . Parent-child relational problem [Z62.820] 07/14/2012    Total Time spent with patient: 45 minutes  Subjective:   Amanda Russell is a 21 y.o. female patient does not warrant admission.  HPI:  21 yo female who presented to the ED after using K2 and having psychosis with agitation.  She was given IM agitation medications and slept.  Today, she is calm and cooperative with no psychosis or suicidal/homicidal ideations or withdrawal symptoms.  She typically uses cannabis but not K2, it was her first time.  Educated her about the dangers of K2.  She lives with her mother and wants to leave, stable for discharge with outpatient resources.   Past Psychiatric History:   Substance abuse  Risk to Self: Suicidal Ideation: No Suicidal Intent: No Is patient at risk for suicide?: No Suicidal Plan?: No Access to Means: No What has been your use of drugs/alcohol within the last 12 months?:  (denies use of drugs currently, but has a hx of THC use) How many times?:  (states that she attempted suicide when she was a teenager) Other Self Harm Risks:  (none reported) Triggers for Past Attempts:  (unknown) Intentional Self  Injurious Behavior: None Risk to Others: Thoughts of Harm to Others: No-Not Currently Present/Within Last 6 Months Current Homicidal Intent: No Current Homicidal Plan: No Access to Homicidal Means: No Identified Victim:  (none) History of harm to others?: No Assessment of Violence: None Noted Violent Behavior Description:  (none reported) Does patient have access to weapons?: No Does patient have a court date: No Prior Inpatient Therapy: Prior Inpatient Therapy:  (mother) Prior Therapy Dates:  (Has been inpatient several times) Prior Therapy Facilty/Provider(s):  ([facility]) Reason for Treatment:  (psychosis) Prior Outpatient Therapy: Prior Outpatient Therapy:  (unknown) Prior Therapy Dates:  (unknown) Prior Therapy Facilty/Provider(s):  (unknown) Reason for Treatment:  (psychosis) Does patient have an ACCT team?: No Does patient have Intensive In-House Services?  : No Does patient have [facility] services? : No Does patient have P4CC services?: No  Past Medical History:  Past Medical History:  Diagnosis Date  . ADHD (attention deficit hyperactivity disorder), combined type 07/14/2012  . Chlamydia   . Suicidal ideation     Past Surgical History:  Procedure Laterality Date  . NO PAST SURGERIES     Family History:  Family History  Problem Relation Age of Onset  . Cancer Neg Hx   . Diabetes Neg Hx   . Hypertension Neg Hx    Family Psychiatric  History:  None  Social History:  History  Alcohol Use  . Yes     History  Drug Use No    Comment: denies    Social History   Social History  . Marital status: Single    Spouse name: N/A  . Number of  children: N/A  . Years of education: N/A   Occupational History  . Student     10th grade at [employer] History Main Topics  . Smoking status: Current Every Day Smoker    Packs/day: 0.50    Years: 0.80    Types: Cigarettes    Last attempt to quit: 01/07/2011  . Smokeless tobacco: Never Used  . Alcohol use  Yes  . Drug use: No     Comment: denies  . Sexual activity: Yes    Partners: Male    Birth control/ protection: Pill   Other Topics Concern  . None   Social History Narrative  . None   Additional Social History:    Allergies:  No Known Allergies  Labs:  Results for orders placed or performed during the hospital encounter of 06/09/17 (from the past 48 hour(s))  Rapid urine drug screen (hospital performed)     Status: Abnormal   Collection Time: 06/09/17  3:00 PM  Result Value Ref Range   Opiates NONE DETECTED NONE DETECTED   Cocaine NONE DETECTED NONE DETECTED   Benzodiazepines NONE DETECTED NONE DETECTED   Amphetamines NONE DETECTED NONE DETECTED   Tetrahydrocannabinol POSITIVE (A) NONE DETECTED   Barbiturates NONE DETECTED NONE DETECTED    Comment:        DRUG SCREEN FOR MEDICAL PURPOSES ONLY.  IF CONFIRMATION IS NEEDED FOR ANY PURPOSE, NOTIFY LAB WITHIN 5 DAYS.        LOWEST DETECTABLE LIMITS FOR URINE DRUG SCREEN Drug Class       Cutoff (ng/mL) Amphetamine      1000 Barbiturate      200 Benzodiazepine   121 Tricyclics       975 Opiates          300 Cocaine          300 THC              50   Comprehensive metabolic panel     Status: Abnormal   Collection Time: 06/09/17  3:31 PM  Result Value Ref Range   Sodium 138 135 - 145 mmol/L   Potassium 3.2 (L) 3.5 - 5.1 mmol/L   Chloride 106 101 - 111 mmol/L   CO2 25 22 - 32 mmol/L   Glucose, Bld 104 (H) 65 - 99 mg/dL   BUN 6 6 - 20 mg/dL   Creatinine, Ser 0.75 0.44 - 1.00 mg/dL   Calcium 8.9 8.9 - 10.3 mg/dL   Total Protein 7.2 6.5 - 8.1 g/dL   Albumin 4.4 3.5 - 5.0 g/dL   AST 19 15 - 41 U/L   ALT 22 14 - 54 U/L   Alkaline Phosphatase 46 38 - 126 U/L   Total Bilirubin 0.8 0.3 - 1.2 mg/dL   GFR calc non Af Amer >60 >60 mL/min   GFR calc Af Amer >60 >60 mL/min    Comment: (NOTE) The eGFR has been calculated using the CKD EPI equation. This calculation has not been validated in all clinical situations. eGFR's  persistently <60 mL/min signify possible Chronic Kidney Disease.    Anion gap 7 5 - 15  Ethanol     Status: None   Collection Time: 06/09/17  3:31 PM  Result Value Ref Range   Alcohol, Ethyl (B) <5 <5 mg/dL    Comment:        LOWEST DETECTABLE LIMIT FOR SERUM ALCOHOL IS 5 mg/dL FOR MEDICAL PURPOSES ONLY   Salicylate level  Status: None   Collection Time: 06/09/17  3:31 PM  Result Value Ref Range   Salicylate Lvl <0.6 2.8 - 30.0 mg/dL  Acetaminophen level     Status: Abnormal   Collection Time: 06/09/17  3:31 PM  Result Value Ref Range   Acetaminophen (Tylenol), Serum <10 (L) 10 - 30 ug/mL    Comment:        THERAPEUTIC CONCENTRATIONS VARY SIGNIFICANTLY. A RANGE OF 10-30 ug/mL MAY BE AN EFFECTIVE CONCENTRATION FOR MANY PATIENTS. HOWEVER, SOME ARE BEST TREATED AT CONCENTRATIONS OUTSIDE THIS RANGE. ACETAMINOPHEN CONCENTRATIONS >150 ug/mL AT 4 HOURS AFTER INGESTION AND >50 ug/mL AT 12 HOURS AFTER INGESTION ARE OFTEN ASSOCIATED WITH TOXIC REACTIONS.   cbc     Status: Abnormal   Collection Time: 06/09/17  3:31 PM  Result Value Ref Range   WBC 6.5 4.0 - 10.5 K/uL   RBC 4.38 3.87 - 5.11 MIL/uL   Hemoglobin 12.7 12.0 - 15.0 g/dL   HCT 35.8 (L) 36.0 - 46.0 %   MCV 81.7 78.0 - 100.0 fL   MCH 29.0 26.0 - 34.0 pg   MCHC 35.5 30.0 - 36.0 g/dL   RDW 12.2 11.5 - 15.5 %   Platelets 173 150 - 400 K/uL    No current facility-administered medications for this encounter.    Current Outpatient Prescriptions  Medication Sig Dispense Refill  . MedroxyPROGESTERone Acetate (DEPO-PROVERA) 150 MG/ML SUSY Inject 150 mg into the muscle every 3 (three) months.    Marland Kitchen acetaminophen (TYLENOL) 500 MG tablet Take 1 tablet (500 mg total) by mouth every 6 (six) hours as needed. (Patient not taking: Reported on 05/26/2017) 30 tablet 0  . ondansetron (ZOFRAN ODT) 4 MG disintegrating tablet Take 1 tablet (4 mg total) by mouth every 8 (eight) hours as needed for nausea or vomiting. (Patient not  taking: Reported on 05/26/2017) 20 tablet 0    Musculoskeletal: Strength & Muscle Tone: within normal limits Gait & Station: normal Patient leans: N/A  Psychiatric Specialty Exam: Physical Exam  Constitutional: She is oriented to person, place, and time. She appears well-developed and well-nourished.  HENT:  Head: Normocephalic.  Neck: Normal range of motion.  Respiratory: Effort normal.  Musculoskeletal: Normal range of motion.  Neurological: She is alert and oriented to person, place, and time.  Psychiatric: She has a normal mood and affect. Her speech is normal and behavior is normal. Judgment and thought content normal. Cognition and memory are normal.    Review of Systems  Psychiatric/Behavioral: Positive for substance abuse.  All other systems reviewed and are negative.   Blood pressure (!) 90/57, pulse (!) 57, temperature 98.6 F (37 C), temperature source Oral, resp. rate 16, SpO2 98 %, unknown if currently breastfeeding.There is no height or weight on file to calculate BMI.  General Appearance: Casual  Eye Contact:  Good  Speech:  Normal Rate  Volume:  Normal  Mood:  Euthymic  Affect:  Congruent  Thought Process:  Coherent and Descriptions of Associations: Intact  Orientation:  Full (Time, Place, and Person)  Thought Content:  WDL and Logical  Suicidal Thoughts:  No  Homicidal Thoughts:  No  Memory:  Immediate;   Good Recent;   Good Remote;   Good  Judgement:  Fair  Insight:  Fair  Psychomotor Activity:  Normal  Concentration:  Concentration: Good and Attention Span: Good  Recall:  Good  Fund of Knowledge:  Fair  Language:  Good  Akathisia:  No  Handed:  Right  AIMS (if  indicated):     Assets:  Housing Leisure Time Physical Health Resilience Social Support  ADL's:  Intact  Cognition:  WNL  Sleep:        Treatment Plan Summary: Daily contact with patient to assess and evaluate symptoms and progress in treatment, Medication management and Plan  cannabis abuse with cannabis induced psychosis with delusions:  -Crisis stabilization -Medication management:  PRN agitation medications given last night on arrival, no medications started due to need to clear the K2 -Individual and substance abuse counseling -Outpatient referrals provided  Disposition: No evidence of imminent risk to self or others at present.    Waylan Boga, NP 06/10/2017 10:25 AM  Patient seen face-to-face for psychiatric evaluation, chart reviewed and case discussed with the physician extender and developed treatment plan. Reviewed the information documented and agree with the treatment plan. Corena Pilgrim, MD
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
## 2017-06-10 NOTE — Discharge Instructions (Addendum)
To help you maintain a sober lifestyle, a substance abuse treatment program may be beneficial to you.  Contact one of the following facilities at your earliest opportunity to ask about enrolling: ° °RESIDENTIAL PROGRAMS: ° °     [facility] °     [address] °     [place], [place] [address] °     [phone] ° °     [facility] °     [address] °     [place], [place] [address] °     [phone] ° °OUTPATIENT PROGRAMS: ° °     [facility] °     [address] °     [place], [place] [address] °     [phone] °     New patients are seen at the walk-in clinic every Tuesday from 9:00 am - 12:00 pm °

## 2017-06-10 NOTE — ED Notes (Signed)
Patient is observed in room talking to herself.  Her speech is rapid and pressured and she is focused on discharge.

## 2017-06-10 NOTE — [facility] Suicide Risk Assessment (Signed)
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 Preterm premature rupture of membranes (PPROM) with unknown onset of labor [O42.919] 07/13/2014  . NVD (normal vaginal delivery) [O80] 07/13/2014  . Suicidal ideation [R45.851] 07/14/2012  . Depression [F32.9] 07/14/2012  . ADHD (attention deficit hyperactivity disorder), combined type [F90.2] 07/14/2012  . Oppositional defiant disorder [F91.3] 07/14/2012  . Parent-child relational problem [Z62.820] 07/14/2012    Total Time spent with patient: 45 minutes   Musculoskeletal: Strength & Muscle Tone: within normal limits Gait & Station: normal Patient leans: N/A  Psychiatric Specialty Exam: Physical Exam  Constitutional: She is oriented to person, place, and time. She appears well-developed and well-nourished.  HENT:  Head: Normocephalic.  Neck: Normal range of motion.  Respiratory: Effort normal.  Musculoskeletal: Normal range of motion.  Neurological: She is alert and oriented to person, place, and time.  Psychiatric: She has a normal mood and affect. Her speech is normal and behavior is normal. Judgment and thought content normal. Cognition and memory are normal.    Review of Systems  Psychiatric/Behavioral: Positive for substance abuse.  All other systems reviewed and are negative.   Blood pressure (!) 90/57, pulse (!) 57, temperature 98.6 F (37 C), temperature source Oral, resp. rate 16, SpO2 98 %, unknown if currently breastfeeding.There is no height or weight on file to calculate BMI.  General Appearance: Casual  Eye Contact:  Good  Speech:  Normal Rate  Volume:  Normal  Mood:  Euthymic  Affect:  Congruent  Thought Process:   Coherent and Descriptions of Associations: Intact  Orientation:  Full (Time, Place, and Person)  Thought Content:  WDL and Logical  Suicidal Thoughts:  No  Homicidal Thoughts:  No  Memory:  Immediate;   Good Recent;   Good Remote;   Good  Judgement:  Fair  Insight:  Fair  Psychomotor Activity:  Normal  Concentration:  Concentration: Good and Attention Span: Good  Recall:  Good  Fund of Knowledge:  Fair  Language:  Good  Akathisia:  No  Handed:  Right  AIMS (if indicated):     Assets:  Housing Leisure Time Physical Health Resilience Social Support  ADL's:  Intact  Cognition:  WNL  Sleep:       Mental Status Per Nursing Assessment::   On Admission:   K2 use with psychosis  Demographic Factors:  Adolescent or young adult  Loss Factors: NA  Historical Factors: NA  Risk Reduction Factors:   Sense of responsibility to family, Living with another person, especially a relative and Positive social support  Continued Clinical Symptoms:  NOne  Cognitive Features That Contribute To Risk:  None    Suicide Risk:  Minimal: No identifiable suicidal ideation.  Patients presenting with no risk factors but with morbid ruminations; may be classified as minimal risk based on the severity of the depressive symptoms    Plan Of Care/Follow-up recommendations:  Activity:  as tolerated Diet:  heart healthy diet  Jaynee Winters, NP 06/10/2017, 6:02 PM

## 2017-06-16 ENCOUNTER — Encounter (HOSPITAL_COMMUNITY): Payer: Self-pay | Admitting: Emergency Medicine

## 2017-06-16 ENCOUNTER — Emergency Department (HOSPITAL_COMMUNITY)
Admission: EM | Admit: 2017-06-16 | Discharge: 2017-06-16 | Disposition: A | Discharge status: Home / Self Care | Payer: Medicaid Other | Attending: Emergency Medicine | Admitting: Emergency Medicine

## 2017-06-16 DIAGNOSIS — R45851 Suicidal ideations: Secondary | ICD-10-CM | POA: Insufficient documentation

## 2017-06-16 DIAGNOSIS — F12150 Cannabis abuse with psychotic disorder with delusions: Secondary | ICD-10-CM | POA: Diagnosis not present

## 2017-06-16 DIAGNOSIS — F909 Attention-deficit hyperactivity disorder, unspecified type: Secondary | ICD-10-CM | POA: Diagnosis not present

## 2017-06-16 DIAGNOSIS — F329 Major depressive disorder, single episode, unspecified: Secondary | ICD-10-CM | POA: Diagnosis present

## 2017-06-16 DIAGNOSIS — F99 Mental disorder, not otherwise specified: Secondary | ICD-10-CM | POA: Diagnosis not present

## 2017-06-16 DIAGNOSIS — F17210 Nicotine dependence, cigarettes, uncomplicated: Secondary | ICD-10-CM | POA: Diagnosis not present

## 2017-06-16 LAB — RAPID URINE DRUG SCREEN, HOSP PERFORMED
AMPHETAMINES: NOT DETECTED
BENZODIAZEPINES: NOT DETECTED
Barbiturates: NOT DETECTED
Cocaine: NOT DETECTED
OPIATES: NOT DETECTED
Tetrahydrocannabinol: POSITIVE — AB

## 2017-06-16 LAB — COMPREHENSIVE METABOLIC PANEL
ALBUMIN: 4.2 g/dL (ref 3.5–5.0)
ALK PHOS: 59 U/L (ref 38–126)
ALT: 20 U/L (ref 14–54)
AST: 19 U/L (ref 15–41)
Anion gap: 7 (ref 5–15)
BUN: 7 mg/dL (ref 6–20)
CALCIUM: 9.1 mg/dL (ref 8.9–10.3)
CO2: 25 mmol/L (ref 22–32)
CREATININE: 0.71 mg/dL (ref 0.44–1.00)
Chloride: 106 mmol/L (ref 101–111)
GFR calc Af Amer: >60 mL/min (ref >60)
GFR calc non Af Amer: >60 mL/min (ref >60)
GLUCOSE: 97 mg/dL (ref 65–99)
Potassium: 3.8 mmol/L (ref 3.5–5.1)
SODIUM: 138 mmol/L (ref 135–145)
Total Bilirubin: 0.6 mg/dL (ref 0.3–1.2)
Total Protein: 7.4 g/dL (ref 6.5–8.1)

## 2017-06-16 LAB — ETHANOL: Alcohol, Ethyl (B): <5 mg/dL (ref <5)

## 2017-06-16 LAB — CBC
HCT: 37.2 % (ref 36.0–46.0)
Hemoglobin: 13.1 g/dL (ref 12.0–15.0)
MCH: 29.4 pg (ref 26.0–34.0)
MCHC: 35.2 g/dL (ref 30.0–36.0)
MCV: 83.4 fL (ref 78.0–100.0)
PLATELETS: 173 10*3/uL (ref 150–400)
RBC: 4.46 MIL/uL (ref 3.87–5.11)
RDW: 12.8 % (ref 11.5–15.5)
WBC: 5.6 10*3/uL (ref 4.0–10.5)

## 2017-06-16 LAB — I-STAT BETA HCG BLOOD, ED (MC, WL, AP ONLY)

## 2017-06-16 LAB — ACETAMINOPHEN LEVEL: Acetaminophen (Tylenol), Serum: <10 ug/mL — ABNORMAL LOW (ref 10–30)

## 2017-06-16 LAB — SALICYLATE LEVEL: Salicylate Lvl: <7 mg/dL (ref 2.8–30.0)
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
## 2017-06-16 NOTE — BH Assessment (Addendum)
[facility] Assessment Progress Note Case was staffed with Elta Guadeloupe, FNP and Dr. Jannifer Franklin who felt like patient did not meet involuntary commitment criteria and was psychiatrically cleared to follow-up at [facility] tomorrow. Informed Benjiman Core and [place], [place] of patient's final disopsition.
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
## 2017-06-16 NOTE — ED Notes (Signed)
Bed: [facility] Expected date:  Expected time:  Means of arrival:  Comments: 

## 2017-06-16 NOTE — Discharge Instructions (Signed)
Return to ER for new or worsening symptoms, any additional concerns.  °

## 2017-06-16 NOTE — ED Notes (Signed)
Bed: [facility] Expected date:  Expected time:  Means of arrival:  Comments: Hold for medical clearance-triage

## 2017-06-18 LAB — HIV ANTIBODY (ROUTINE TESTING W REFLEX): HIV SCREEN 4TH GENERATION: NONREACTIVE

## 2017-06-20 ENCOUNTER — Emergency Department (HOSPITAL_COMMUNITY)
Admission: EM | Admit: 2017-06-20 | Discharge: 2017-06-20 | Discharge status: Against Medical Advice | Payer: Medicaid Other

## 2017-06-20 NOTE — ED Triage Notes (Signed)
Pt called for triage with no answer 

## 2018-01-05 ENCOUNTER — Encounter (HOSPITAL_COMMUNITY): Payer: Self-pay | Admitting: Emergency Medicine

## 2018-01-05 ENCOUNTER — Emergency Department (HOSPITAL_COMMUNITY)
Admission: EM | Admit: 2018-01-05 | Discharge: 2018-01-06 | Disposition: A | Discharge status: Home / Self Care | Payer: Medicaid Other | Attending: Emergency Medicine | Admitting: Emergency Medicine

## 2018-01-05 ENCOUNTER — Emergency Department (HOSPITAL_COMMUNITY): Payer: Medicaid Other

## 2018-01-05 ENCOUNTER — Other Ambulatory Visit: Payer: Self-pay

## 2018-01-05 DIAGNOSIS — Z9119 Patient's noncompliance with other medical treatment and regimen: Secondary | ICD-10-CM | POA: Insufficient documentation

## 2018-01-05 DIAGNOSIS — Z915 Personal history of self-harm: Secondary | ICD-10-CM | POA: Insufficient documentation

## 2018-01-05 DIAGNOSIS — F1910 Other psychoactive substance abuse, uncomplicated: Secondary | ICD-10-CM

## 2018-01-05 DIAGNOSIS — Z008 Encounter for other general examination: Secondary | ICD-10-CM

## 2018-01-05 DIAGNOSIS — F12259 Cannabis dependence with psychotic disorder, unspecified: Secondary | ICD-10-CM | POA: Insufficient documentation

## 2018-01-05 DIAGNOSIS — Z79899 Other long term (current) drug therapy: Secondary | ICD-10-CM | POA: Insufficient documentation

## 2018-01-05 DIAGNOSIS — F314 Bipolar disorder, current episode depressed, severe, without psychotic features: Secondary | ICD-10-CM | POA: Insufficient documentation

## 2018-01-05 DIAGNOSIS — F17210 Nicotine dependence, cigarettes, uncomplicated: Secondary | ICD-10-CM | POA: Insufficient documentation

## 2018-01-05 LAB — CBC
HEMATOCRIT: 35.4 % — AB (ref 36.0–46.0)
Hemoglobin: 12 g/dL (ref 12.0–15.0)
MCH: 28.8 pg (ref 26.0–34.0)
MCHC: 33.9 g/dL (ref 30.0–36.0)
MCV: 84.9 fL (ref 78.0–100.0)
PLATELETS: 180 10*3/uL (ref 150–400)
RBC: 4.17 MIL/uL (ref 3.87–5.11)
RDW: 12.2 % (ref 11.5–15.5)
WBC: 8.1 10*3/uL (ref 4.0–10.5)

## 2018-01-05 LAB — COMPREHENSIVE METABOLIC PANEL
ALBUMIN: 3.1 g/dL — AB (ref 3.5–5.0)
ALT: 20 U/L (ref 14–54)
AST: 25 U/L (ref 15–41)
Alkaline Phosphatase: 74 U/L (ref 38–126)
Anion gap: 10 (ref 5–15)
BUN: 11 mg/dL (ref 6–20)
CHLORIDE: 106 mmol/L (ref 101–111)
CO2: 20 mmol/L — ABNORMAL LOW (ref 22–32)
Calcium: 8.6 mg/dL — ABNORMAL LOW (ref 8.9–10.3)
Creatinine, Ser: 1.05 mg/dL — ABNORMAL HIGH (ref 0.44–1.00)
GFR calc Af Amer: >60 mL/min (ref >60)
GFR calc non Af Amer: >60 mL/min (ref >60)
GLUCOSE: 152 mg/dL — AB (ref 65–99)
POTASSIUM: 3 mmol/L — AB (ref 3.5–5.1)
SODIUM: 136 mmol/L (ref 135–145)
Total Bilirubin: 0.6 mg/dL (ref 0.3–1.2)
Total Protein: 5.6 g/dL — ABNORMAL LOW (ref 6.5–8.1)

## 2018-01-05 LAB — RAPID URINE DRUG SCREEN, HOSP PERFORMED
Amphetamines: NOT DETECTED
BARBITURATES: NOT DETECTED
BENZODIAZEPINES: POSITIVE — AB
COCAINE: NOT DETECTED
Opiates: NOT DETECTED
Tetrahydrocannabinol: POSITIVE — AB

## 2018-01-05 LAB — SALICYLATE LEVEL: Salicylate Lvl: <7 mg/dL (ref 2.8–30.0)

## 2018-01-05 LAB — I-STAT BETA HCG BLOOD, ED (MC, WL, AP ONLY)

## 2018-01-05 LAB — I-STAT TROPONIN, ED: Troponin i, poc: 0.01 ng/mL (ref 0.00–0.08)

## 2018-01-05 LAB — ACETAMINOPHEN LEVEL

## 2018-01-05 LAB — ETHANOL: Alcohol, Ethyl (B): <10 mg/dL (ref <10)

## 2018-01-05 MED ORDER — SODIUM CHLORIDE 0.9 % IV BOLUS
1000.0000 mL | Freq: Once | INTRAVENOUS | Status: AC
  Administered 2018-01-05: 1000 mL via INTRAVENOUS
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
## 2018-01-06 MED ORDER — POTASSIUM CHLORIDE CRYS ER 20 MEQ PO TBCR: 20.0000 meq | EXTENDED_RELEASE_TABLET | Freq: Two times a day (BID) | ORAL | Status: DC

## 2018-01-06 MED ORDER — POTASSIUM CHLORIDE CRYS ER 20 MEQ PO TBCR
40.0000 meq | EXTENDED_RELEASE_TABLET | Freq: Once | ORAL | Status: DC
Start: 2018-01-06 — End: 2018-01-07

## 2018-01-06 NOTE — BH Assessment (Addendum)
Tele Assessment Note   Patient Name: Amanda Russell MRN: [id number] Referring Physician: Demetrios Loll, PA-C Location of Patient: [facility], B17 Location of Provider: Behavioral Health TTS Department  Amanda Russell is an 22 y.o. single female who presents via EMS and law enforcement to [facility] with medical history significant for bipolar disorder and polysubstance abuse. She currently exhibits disorganized thought process and erratic behavior. Per EMS patient was walking erratically around an intersection with oncoming traffic. Police were called along with EMS. Patient was ambulatory to the EMS truck and once inside the truck she started to rip off all the equipment and drawn by the back of the truck and ran back into traffic. [employer] call patient and placed her in handcuffs.  Patient was extremely agitated and anxious in the EMS. She was given 5 mg of IM midazolam. She does have history of cannabis abuse. [employer] reports that patient's car smelled like marijuana. They do report they are familiar with patient and she has a long history of using marijuana.    Law enforcement petitioned for involuntary commitment. Affidavit and petition states: "Respondent came in contact with police on side of road. Got into strangers car and stated someone was trying to kill her, meaning the police officer. Stated to police she was going to kill a woman (person unknown). Attempted to jump into another stranger's car by pulling on the dor handle. Not taking her meds, diagnosis unknown, using synthetic marijuana."  Pt was a poor historian due to flight of ideas and tangential and disorganized thought process. She states she was brought to the ED "because I almost passed out and died." She say she was in a psychiatric facility in March 2019 but cannot remember the name or location. She says a judge order her to take medication "but I'm fertile and want to have a baby." Pt says she hasn't taken medication since  March because "the medication is giving me a nervous breakdown." She says the medication "took my breath." Pt reports she feels tired and frustrated. She denies current suicidal ideation. Pt reports she had a suicide attempt at age 5. When asked why she was walking in traffic Pt states "because I was dying and was trying to get someone to help me." She denies current homicidal ideation or history of violence. She denies current auditory or visual hallucinations. Pt reports she uses marijuana but would not describe details of use. She denies alcohol or other substance use. Pt's urine drug screen is positive for cannabis and benzodiazepines.   Pt's medical record indicates Pt has struggled with mental illness since age 89. She has a history of psychotic episodes associated with cannabis use and has tested positive for cannabis on every visit to the ED. Pt reports she lives alone in public housing. She says her primary stressor is her two sons have been placed in DSS custody. Pt reports she has several courts dates, the first on 01/07/18, but she doesn't remember what they are regarding. She identifies her grandmother as her only support. Pt says she does not currently have a psychiatrist or therapist and states "I probably need one."  Pt is dressed in hospital scrubs, alert and oriented x4. Pt speaks in a clear tone, at moderate volume and normal pace. Motor behavior appears normal. Eye contact is good. Pt reports mood as frustrate and affect is often incongruent with mood, with Pt at times smiling. Thought process is disorganized and tangential. Pt was generally cooperative during  assessment. 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 ADHD  (attention deficit hyperactivity disorder), combined type 07/14/2012  . Chlamydia   . Suicidal ideation     Past Surgical History:  Procedure Laterality Date  . NO PAST SURGERIES      Family History:  Family History  Problem Relation Age of Onset  . Cancer Neg Hx   . Diabetes Neg Hx   . Hypertension Neg Hx     Social History:  reports that she has been smoking cigarettes.  She has a 0.40 pack-year smoking history. She has never used smokeless tobacco. She reports that she drinks alcohol. She reports that she has current or past drug history. Drug: Marijuana. Frequency: 7.00 times per week.  Additional Social History:  Alcohol / Drug Use Pain Medications: Pt denies Prescriptions: Pt unsure what medications she takes Over the Counter: Pt reports taking "allergy medication" History of alcohol / drug use?: Yes Longest period of sobriety (when/how long): no period of abstinence reported other that a few days at a time Negative Consequences of Use: Personal relationships, Work / School Substance #1 Name of Substance 1: Marijuana 1 - Age of First Use: Adolescent 1 - Amount (size/oz): Unknown 1 - Frequency: Unknown 1 - Duration: Ongoing 1 - Last Use / Amount: 01/05/18  CIWA: CIWA-Ar BP: 107/74 Pulse Rate: 77 COWS:    Allergies: No Known Allergies  Home Medications:  (Not in a hospital admission)  OB/GYN Status:  Patient's last menstrual period was 12/29/2017.  General Assessment Data Assessment unable to be completed: Yes Reason for not completing assessment: RN reports unable to set up tele-cart at this time due to crisis. Location of Assessment: [facility] ED TTS Assessment: In system Is this a Tele or Face-to-Face Assessment?: Tele Assessment Is this an Initial Assessment or a Re-assessment for this encounter?: Initial Assessment Marital status: Single Maiden name: Pryce Is patient pregnant?: No Pregnancy Status: No Living Arrangements: Alone, Other (Comment)(Public  housing) Can pt return to current living arrangement?: Yes Admission Status: Involuntary Is patient capable of signing voluntary admission?: Yes Referral Source: Self/Family/Friend Insurance type: Medicaid     Crisis Care Plan Living Arrangements: Alone, Other (Comment)(Public housing) Legal Guardian: Other:(Self) Name of Psychiatrist: None Name of Therapist: None  Education Status Is patient currently in school?: No Is the patient employed, unemployed or receiving disability?: Unemployed  Risk to self with the past 6 months Suicidal Ideation: No Has patient been a risk to self within the past 6 months prior to admission? : No Suicidal Intent: No Has patient had any suicidal intent within the past 6 months prior to admission? : No Is patient at risk for suicide?: No Suicidal Plan?: No Has patient had any suicidal plan within the past 6 months prior to admission? : No Access to Means: No What has been your use of drugs/alcohol within the last 12 months?: Pt using marijuana Previous Attempts/Gestures: Yes How many times?: 1 Other Self Harm Risks: Pt was walking erratically in traffic Triggers for Past Attempts: Other personal contacts Intentional Self Injurious Behavior: None Family Suicide History: Unknown Recent stressful life event(s): Other (Comment), Legal Issues(Children in DSS custody) Persecutory voices/beliefs?: No Depression: Yes Depression Symptoms: Feeling angry/irritable, Loss of interest in usual pleasures, Tearfulness Substance  abuse history and/or treatment for substance abuse?: Yes Suicide prevention information given to non-admitted patients: Not applicable  Risk to Others within the past 6 months Homicidal Ideation: No Does patient have any lifetime risk of violence toward others beyond the six months prior to admission? : No Thoughts of Harm to Others: No Current Homicidal Intent: No Current Homicidal Plan: No Access to Homicidal Means: No Identified  Victim: None History of harm to others?: No Assessment of Violence: None Noted Violent Behavior Description: No known history of violence Does patient have access to weapons?: No Criminal Charges Pending?: Yes Describe Pending Criminal Charges: Pt doesn't know Does patient have a court date: Yes Court Date: 01/07/18 Is patient on probation?: No  Psychosis Delusions: Unspecified(Disorganized behavior)  Mental Status Report Appearance/Hygiene: In scrubs Eye Contact: Good Motor Activity: Unremarkable, Freedom of movement Speech: Tangential Level of Consciousness: Alert Mood: Other (Comment)(Frustrated) Affect: Inconsistent with thought content Anxiety Level: Minimal Thought Processes: Tangential, Flight of Ideas Judgement: Impaired Orientation: Person, Place, Time Obsessive Compulsive Thoughts/Behaviors: None  Cognitive Functioning Concentration: Decreased Memory: Unable to Assess Is patient IDD: No Is patient DD?: No Insight: Poor Impulse Control: Poor Appetite: Fair Have you had any weight changes? : No Change Sleep: No Change Total Hours of Sleep: 7 Vegetative Symptoms: None  ADLScreening [facility] Assessment Services) Patient's cognitive ability adequate to safely complete daily activities?: Yes Patient able to express need for assistance with ADLs?: Yes Independently performs ADLs?: Yes (appropriate for developmental age)  Prior Inpatient Therapy Prior Inpatient Therapy: Yes Prior Therapy Dates: 11/2017, multiple admits Prior Therapy Facilty/Provider(s): Pt cannot remember name of facilities Reason for Treatment: Psychosis, substance use  Prior Outpatient Therapy Prior Outpatient Therapy: Yes Prior Therapy Dates: unknown Prior Therapy Facilty/Provider(s): unknown Reason for Treatment: Psychosis, substance use Does patient have an ACCT team?: No Does patient have Intensive In-House Services?  : No Does patient have [facility] services? : No Does patient have P4CC  services?: No  ADL Screening (condition at time of admission) Patient's cognitive ability adequate to safely complete daily activities?: Yes Is the patient deaf or have difficulty hearing?: No Does the patient have difficulty seeing, even when wearing glasses/contacts?: No Does the patient have difficulty concentrating, remembering, or making decisions?: No Patient able to express need for assistance with ADLs?: Yes Does the patient have difficulty dressing or bathing?: No Independently performs ADLs?: Yes (appropriate for developmental age) Does the patient have difficulty walking or climbing stairs?: No Weakness of Legs: None Weakness of Arms/Hands: None       Abuse/Neglect Assessment (Assessment to be complete while patient is alone) Abuse/Neglect Assessment Can Be Completed: Yes Physical Abuse: Denies Verbal Abuse: Denies Sexual Abuse: Denies Exploitation of patient/patient's resources: Denies Self-Neglect: Denies     Merchant navy officer (For Healthcare) Does Patient Have a Medical Advance Directive?: No Would patient like information on creating a medical advance directive?: No - Patient declined    Additional Information 1:1 In Past 12 Months?: No CIRT Risk: No Elopement Risk: Yes Does patient have medical clearance?: Yes     Disposition: Binnie Rail, AC at [facility], confirmed adult unit is at capacity. Gave clinical report to Nira Conn, NP who said Pt meets criteria for inpatient psychiatric treatment. TTS will contact facilities for placement. Notified Rise Mu, PA-C and Loistine Chance, RN of recommendation.  Disposition Initial Assessment Completed for this Encounter: Yes Disposition of Patient: Admit Type of inpatient treatment program: Adult Patient refused recommended treatment: Yes  This service was provided via  telemedicine using a 2-way, interactive audio and Immunologistvideo technology.  Names of all persons participating in this telemedicine service  and their role in this encounter. Name: Blythe StanfordSabrina Scharnhorst Role: Patient  Name: Shela CommonsFord Tessie Ordaz Jr, WisconsinLPC Role: TTS counselor         Harlin RainFord Ellis Patsy BaltimoreWarrick Jr, [facility], [facility] Center[facility]CC, [facility]DCC Triage Specialist [phone]  Pamalee LeydenWarrick Jr, Jerine Surles Ellis 01/06/2018 1:52 AM

## 2018-01-06 NOTE — Progress Notes (Addendum)
Pt. meets criteria for inpatient treatment per Jason Berry, NP.  Referred out to the following hospitals:  [facility]     [facility]  [facility]  [facility]  [facility]  [facility]  [facility]  [facility]  [facility]  [facility]  [facility]  [facility]  [facility]        Disposition CSW will continue to follow for placement.  Timmothy EulerJean T. Kaylyn LimSutter, MSW, LCSWA Disposition Clinical Social Work [phone] (cell) [phone] (office)

## 2018-01-06 NOTE — ED Provider Notes (Signed)
Patient in the emergency department under IVC for psychotic and agitated behavior, running in traffic. On assessment she is calm and appropriate. She is not currently psychotic or delusional. She has no SI. She is willing to contract for safety. She has been evaluated by psychiatry with recommendation for discharge home with outpatient follow-up. Discussed with patient plan for discharge with outpatient follow-up and she is in agreement with plan. She does feel safe at home.   Tilden Fossaees, Hayli Milligan, MD 01/06/18 2157

## 2018-01-06 NOTE — ED Provider Notes (Signed)
[facility] EMERGENCY DEPARTMENT Provider Note   CSN: [id number] Arrival date & time: 01/05/18  2203     History   Chief Complaint Chief Complaint  Patient presents with  . Drug Overdose    HPI Amanda Russell is a 22 y.o. female.  HPI 22 year old African-American female past medical history significant for bipolar disorder, ADHD, polysubstance abuse, prior suicide attempt presents to the emergency department today by EMS and [employer] for medical clearance possible drug ingestion.  Per EMS patient was walking erratically around and interjections with oncoming traffic.  Police were called along with EMS.  Patient was ambulatory to the EMS truck and once inside the truck she started to rip off all the equipment and drawn by the back of the truck and ran back into traffic.  [employer] call patient and placed her in handcuffs.  Patient was extremely agitated and anxious in the EMS.  She was given 5 mg of IM midazolam.  Patient states that she did smoke some and is eating but she will not giving any further information what she took.  She does have history of cannabis abuse.  [employer] reports that patient's car smelled like marijuana.  They do report they are familiar with patient and she has a long history of using marijuana.  EMS also noted that patient had a needle mark in her left [facility] that was not a result of EMS.  Patient denies any IV drug use.  Denies any other drug use.  Patient denies taking any drugs to hurt herself.  Denies any homicidal ideations.  Denies any auditory visual hallucinations.  She reports having history of suicide attempt in the past however she would not give me any further information concerning this.  She denies any complaints at this time.  Patient denies any alcohol use.  Denies any tobacco use at this time.  States that she has been off of her medications.  Pt denies any fever, chill, ha, vision changes, lightheadedness, dizziness, congestion, neck pain, cp, sob,  cough, abd pain, n/v/d, urinary symptoms, change in bowel habits, melena, hematochezia, lower extremity paresthesias.  Past Medical History:  Diagnosis Date  . ADHD (attention deficit hyperactivity disorder), combined type 07/14/2012  . Chlamydia   . Suicidal ideation     Patient Active Problem List   Diagnosis Date Noted  . 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 Preterm premature rupture of membranes (PPROM) with unknown onset of labor 07/13/2014  . NVD (normal vaginal delivery) 07/13/2014  . Suicidal ideation 07/14/2012  . Depression 07/14/2012  . ADHD (attention deficit hyperactivity disorder), combined type 07/14/2012  . Oppositional defiant disorder 07/14/2012  . Parent-child relational problem 07/14/2012    Past Surgical History:  Procedure Laterality Date  . NO PAST SURGERIES       OB History    Gravida  2   Para  2   Term  1   Preterm  1   AB      Living  2     SAB      TAB      Ectopic      Multiple      Live Births  2            Home Medications    Prior to Admission medications   Medication Sig Start Date End Date Taking? Authorizing Provider  acetaminophen (TYLENOL) 500 MG tablet Take 1 tablet (500 mg total) by mouth every 6 (six) hours  as needed. Patient not taking: Reported on 05/26/2017 09/04/16   Marlon PelGreene, Tiffany, PA-C  MedroxyPROGESTERone Acetate (DEPO-PROVERA) 150 MG/ML SUSY Inject 150 mg into the muscle every 3 (three) months.    [provider]  ondansetron (ZOFRAN ODT) 4 MG disintegrating tablet Take 1 tablet (4 mg total) by mouth every 8 (eight) hours as needed for nausea or vomiting. Patient not taking: Reported on 05/26/2017 09/09/16   Deatra Canterxford, William J, FNP    Family History Family History  Problem Relation Age of Onset  . Cancer Neg Hx   . Diabetes Neg Hx   . Hypertension Neg Hx     Social History Social History   Tobacco Use  . Smoking status: Current Every Day Smoker     Packs/day: 0.50    Years: 0.80    Pack years: 0.40    Types: Cigarettes    Last attempt to quit: 01/07/2011    Years since quitting: 7.0  . Smokeless tobacco: Never Used  Substance Use Topics  . Alcohol use: Yes  . Drug use: Yes    Frequency: 7.0 times per week    Types: Marijuana    Comment: denies     Allergies   Patient has no known allergies.   Review of Systems Review of Systems  All other systems reviewed and are negative.    Physical Exam Updated Vital Signs BP 111/79   Pulse 75   Temp 98.4 F (36.9 C) (Oral)   Resp 12   Ht 5\' 5"  (1.651 m)   Wt 58.5 kg (129 lb)   LMP 12/29/2017   SpO2 100%   BMI 21.47 kg/m   Physical Exam  Constitutional: She is oriented to person, place, and time. She appears well-developed and well-nourished. No distress.  HENT:  Head: Normocephalic and atraumatic.  Eyes: Pupils are equal, round, and reactive to light. Conjunctivae and EOM are normal. Right eye exhibits no discharge. Left eye exhibits no discharge. No scleral icterus.  Pupils are dilated and sluggish to light but are equal round and reactive.  Neck: Normal range of motion. Neck supple.  Cardiovascular: Normal rate, regular rhythm, normal heart sounds and intact distal pulses. Exam reveals no gallop and no friction rub.  No murmur heard. Pulmonary/Chest: Effort normal and breath sounds normal. No stridor. No respiratory distress. She has no wheezes. She has no rales. She exhibits no tenderness.  Musculoskeletal: Normal range of motion.  Neurological: She is alert and oriented to person, place, and time.  Skin: Skin is warm and dry. Capillary refill takes less than 2 seconds. No pallor.  Psychiatric: Judgment normal. Her affect is labile. Her speech is rapid and/or pressured and slurred. She is slowed and withdrawn. Thought content is delusional. She expresses no homicidal and no suicidal ideation. She expresses no suicidal plans and no homicidal plans.  Nursing note and  vitals reviewed.    ED Treatments / Results  Labs (all labs ordered are listed, but only abnormal results are displayed) Labs Reviewed  COMPREHENSIVE METABOLIC PANEL - Abnormal; Notable for the following components:      Result Value   Potassium 3.0 (*)    CO2 20 (*)    Glucose, Bld 152 (*)    Creatinine, Ser 1.05 (*)    Calcium 8.6 (*)    Total Protein 5.6 (*)    Albumin 3.1 (*)    All other components within normal limits  ACETAMINOPHEN LEVEL - Abnormal; Notable for the following components:   Acetaminophen (Tylenol), Serum <  10 (*)    All other components within normal limits  CBC - Abnormal; Notable for the following components:   HCT 35.4 (*)    All other components within normal limits  RAPID URINE DRUG SCREEN, HOSP PERFORMED - Abnormal; Notable for the following components:   Benzodiazepines POSITIVE (*)    Tetrahydrocannabinol POSITIVE (*)    All other components within normal limits  ETHANOL  SALICYLATE LEVEL  I-STAT BETA HCG BLOOD, ED ([facility], [facility], [facility] ONLY)  I-STAT TROPONIN, ED  CBG MONITORING, ED    EKG EKG Interpretation  Date/Time:  Sunday January 05 2018 22:36:40 EDT Ventricular Rate:  99 PR Interval:    QRS Duration: 88 QT Interval:  331 QTC Calculation: 425 R Axis:   69 Text Interpretation:  Sinus rhythm Confirmed by Zadie Rhine ([id number]) on 01/05/2018 11:23:06 PM   Radiology Dg Chest 2 View  Result Date: 01/05/2018 CLINICAL DATA:  Patient walking erratically around intersection. EXAM: CHEST - 2 VIEW COMPARISON:  None. FINDINGS: The heart size and mediastinal contours are within normal limits. Both lungs are clear. The visualized skeletal structures are unremarkable. IMPRESSION: No active cardiopulmonary disease. Electronically Signed   By: Sherian Rein M.D.   On: 01/05/2018 23:13    Procedures Procedures (including critical care time)  Medications Ordered in ED Medications  potassium chloride SA (K-DUR,KLOR-CON) CR tablet 40 mEq (has no  administration in time range)  sodium chloride 0.9 % bolus 1,000 mL (1,000 mLs Intravenous New Bag/Given 01/05/18 2247)     Initial Impression / Assessment and Plan / ED Course  I have reviewed the triage vital signs and the nursing notes.  Pertinent labs & imaging results that were available during my care of the patient were reviewed by me and considered in my medical decision making (see chart for details).     Patient presents to the emergency department today for evaluation of erratic behavior and harm to self.  She was brought in by EMS and [employer].  Patient does have a history of bipolar disorder, anxiety and depression has been off her medications.  She also reports marijuana use.  Patient with significant psychiatric history has been seen in the ED several times for same.  Patient was seen tonight running around in oncoming traffic.  Was sedated with Versed by EMS.  She denies any complaints at this time.  Patient alert oriented x3.  The patient was tachycardic however this has since resolved with IV fluids.  Patient has had soft blood pressures with systolic in the 90s with history of same.  Patient denies any complaints at this time.  Otherwise vital signs are reassuring.  Screening lab work reveals no leukocytosis.  Hemoglobin is normal.  Mild hypokalemia 3.0 which was replaced with oral potassium. Will need to be rechecked by her pcp.  Mild elevation in creatinine that was treated with IV fluids.  Beta hCG negative.  Normal salicylate and acetaminophen level.  Urine drug screen positive for benzos and marijuana.  Ethanol is normal.  Negative troponin.  EKG shows sinus rhythm.  Chest x-ray reveals no signs of focal infiltrate.  IVC paperwork initiated by [employer].  Patient does seem manic and psychotic.  She was a harm to self with erratic behavior and oncoming traffic.  First exam paperwork was filled out by Dr. Bebe Shaggy.  No suicidal or homicidal ideations.  Does appear to be responding to  internal stimuli.  We will consult TTS.  Given reassuring vital signs and lab work patient can  be medically cleared for TTS evaluation and disposition.  Psychiatric hold orders will be placed.  Patient discussed with my attending who is agreed with the above plan.  Final Clinical Impressions(s) / ED Diagnoses   Final diagnoses:  Polysubstance abuse [facility])  Encounter for medical clearance for patient hold    ED Discharge Orders    None       Wallace Keller 01/06/18 0055    Zadie Rhine, MD 01/06/18 [phone]

## 2018-01-06 NOTE — Consult Note (Signed)
Telepsych Consultation   Reason for Consult:  Psychotic behavior Referring Physician:  EDP Location of Patient:  [place] Location of Provider: [facility]  Patient Identification: Amanda Russell MRN:  [id number] Principal Diagnosis: <principal problem not specified> Diagnosis:   Patient Active Problem List   Diagnosis Date Noted  . 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 Preterm premature rupture of membranes (PPROM) with unknown onset of labor [O42.919] 07/13/2014  . NVD (normal vaginal delivery) [O80] 07/13/2014  . Suicidal ideation [R45.851] 07/14/2012  . Depression [F32.9] 07/14/2012  . ADHD (attention deficit hyperactivity disorder), combined type [F90.2] 07/14/2012  . Oppositional defiant disorder [F91.3] 07/14/2012  . Parent-child relational problem [Z62.820] 07/14/2012    Total Time spent with patient: 20 minutes  Subjective:   Amanda Russell is a 22 y.o. female was reassessed by NP and Counselor. Patient is awake, alert and oriented *3.Patient is clear with speech and thoughts. Reports she was smoking weed and is unable to recall what happened lastnight. Patient denies  suicidal or homicidal ideation. Patient is denying thoughts of self harm or intent. Patient denies auditory or visual hallucination during this assessment. Patient doesn't appear to responding to internal stimuli.  Patient seen via tele assessment, smiling, pleasant and cooperative. Reports a history of mental illness however states she hasn't follow up with medications or psychiatry. Patient reports she was unsure of her previous diagnosis.  Patient provided verbal permission to speak with mother and aunt.   [name] and Amanda Russell) . Noted mother called NP from [phone] reports she feels safe with patient returning home and deny any concerns at this time. Case staffed with MD. Recommend discharge with additional resources. Support  encouragement and reassurance was provided.     HPI: Per assessment note: Amanda Russell is an 22 y.o. single female who presents via EMS and law enforcement to [facility] ED with medical history significant for bipolar disorder and polysubstance abuse. She currently exhibits disorganized thought process and erratic behavior. Per EMS patient was walking erratically around an intersection with oncoming traffic.Police were called along with EMS. Patient was ambulatory to the EMS truck and once inside the truck she started to rip off all the equipment and drawn by the back of the truck and ran back into traffic. [employer] call patient and placed her in handcuffs. Patient was extremely agitated and anxious in the EMS. She was given 5 mg of IM midazolam. She does have history of cannabis abuse. [employer] reports that patient's car smelled like marijuana. They do report they are familiar with patient and she has a long history of using marijuana.   Law enforcement petitioned for involuntary commitment. Affidavit and petition states: "Respondent came in contact with police on side of road. Got into strangers car and stated someone was trying to kill her, meaning the police officer. Stated to police she was going to kill a woman (person unknown). Attempted to jump into another stranger's car by pulling on the dor handle. Not taking her meds, diagnosis unknown, using synthetic marijuana."  Pt was a poor historian due to flight of ideas and tangential and disorganized thought process. She states she was brought to the ED "because I almost passed out and died." She say she was in a psychiatric facility in March 2019 but cannot remember the name or location. She says a judge order her to take medication "but I'm fertile and want to have a baby." Pt says  she hasn't taken medication since March because "the medication is giving me a nervous breakdown." She says the medication "took my breath." Pt reports she feels tired and  frustrated. She denies current suicidal ideation. Pt reports she had a suicide attempt at age 71. When asked why she was walking in traffic Pt states "because I was dying and was trying to get someone to help me." She denies current homicidal ideation or history of violence. She denies current auditory or visual hallucinations. Pt reports she uses marijuana but would not describe details of use. She denies alcohol or other substance use. Pt's urine drug screen is positive for cannabis and benzodiazepines.   Pt's medical record indicates Pt has struggled with mental illness since age 35. She has a history of psychotic episodes associated with cannabis use and has tested positive for cannabis on every visit to the ED. Pt reports she lives alone in public housing. She says her primary stressor is her two sons have been placed in [place] custody. Pt reports she has several courts dates, the first on 01/07/18, but she doesn't remember what they are regarding. She identifies her grandmother as her only support. Pt says she does not currently have a psychiatrist or therapist and states "I probably need one."    Past Psychiatric History:   Risk to Self: Suicidal Ideation: No Suicidal Intent: No Is patient at risk for suicide?: Yes Suicidal Plan?: No Access to Means: No What has been your use of drugs/alcohol within the last 12 months?: Pt using marijuana How many times?: 1 Other Self Harm Risks: Pt was walking erratically in traffic Triggers for Past Attempts: Other personal contacts Intentional Self Injurious Behavior: None Risk to Others: Homicidal Ideation: No Thoughts of Harm to Others: No Current Homicidal Intent: No Current Homicidal Plan: No Access to Homicidal Means: No Identified Victim: None History of harm to others?: No Assessment of Violence: None Noted Violent Behavior Description: No known history of violence Does patient have access to weapons?: No Criminal Charges  Pending?: Yes Describe Pending Criminal Charges: Pt doesn't know Does patient have a court date: Yes Court Date: 01/07/18 Prior Inpatient Therapy: Prior Inpatient Therapy: Yes Prior Therapy Dates: 11/2017, multiple admits Prior Therapy Facilty/Provider(s): Pt cannot remember name of facilities Reason for Treatment: Psychosis, substance use Prior Outpatient Therapy: Prior Outpatient Therapy: Yes Prior Therapy Dates: unknown Prior Therapy Facilty/Provider(s): unknown Reason for Treatment: Psychosis, substance use Does patient have an ACCT team?: No Does patient have Intensive In-House Services?  : No Does patient have [facility] services? : No Does patient have P4CC services?: No  Past Medical History:  Past Medical History:  Diagnosis Date  . ADHD (attention deficit hyperactivity disorder), combined type 07/14/2012  . Chlamydia   . Suicidal ideation     Past Surgical History:  Procedure Laterality Date  . NO PAST SURGERIES     Family History:  Family History  Problem Relation Age of Onset  . Cancer Neg Hx   . Diabetes Neg Hx   . Hypertension Neg Hx    Family Psychiatric  History:  Social History:  Social History   Substance and Sexual Activity  Alcohol Use Yes     Social History   Substance and Sexual Activity  Drug Use Yes  . Frequency: 7.0 times per week  . Types: Marijuana   Comment: denies    Social History   Socioeconomic History  . Marital status: Single    Spouse name: Not on file  . Number  of children: Not on file  . Years of education: Not on file  . Highest education level: Not on file  Occupational History  . 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 Financial resource strain: Not on file  . Food insecurity:    Worry: Not on file    Inability: Not on file  . Transportation needs:    Medical: Not on file    Non-medical: Not on file  Tobacco Use  . Smoking status: Current Every Day Smoker    Packs/day: 0.50     Years: 0.80    Pack years: 0.40    Types: Cigarettes    Last attempt to quit: 01/07/2011    Years since quitting: 7.0  . Smokeless tobacco: Never Used  Substance and Sexual Activity  . Alcohol use: Yes  . Drug use: Yes    Frequency: 7.0 times per week    Types: Marijuana    Comment: denies  . Sexual activity: Yes    Partners: Male    Birth control/protection: Pill  Lifestyle  . Physical activity:    Days per week: Not on file    Minutes per session: Not on file  . Stress: Not on file  Relationships  . Social connections:    Talks on phone: Not on file    Gets together: Not on file    Attends religious service: Not on file    Active member of club or organization: Not on file    Attends meetings of clubs or organizations: Not on file    Relationship status: Not on file  Other Topics Concern  . Not on file  Social History Narrative  . Not on file   Additional Social History:    Allergies:  No Known Allergies  Labs:  Results for orders placed or performed during the hospital encounter of 01/05/18 (from the past 48 hour(s))  Comprehensive metabolic panel     Status: Abnormal   Collection Time: 01/05/18 10:20 PM  Result Value Ref Range   Sodium 136 135 - 145 mmol/L   Potassium 3.0 (L) 3.5 - 5.1 mmol/L   Chloride 106 101 - 111 mmol/L   CO2 20 (L) 22 - 32 mmol/L   Glucose, Bld 152 (H) 65 - 99 mg/dL   BUN 11 6 - 20 mg/dL   Creatinine, Ser 1.05 (H) 0.44 - 1.00 mg/dL   Calcium 8.6 (L) 8.9 - 10.3 mg/dL   Total Protein 5.6 (L) 6.5 - 8.1 g/dL   Albumin 3.1 (L) 3.5 - 5.0 g/dL   AST 25 15 - 41 U/L   ALT 20 14 - 54 U/L   Alkaline Phosphatase 74 38 - 126 U/L   Total Bilirubin 0.6 0.3 - 1.2 mg/dL   GFR calc non Af Amer >60 >60 mL/min   GFR calc Af Amer >60 >60 mL/min    Comment: (NOTE) The eGFR has been calculated using the CKD EPI equation. This calculation has not been validated in all clinical situations. eGFR's persistently <60 mL/min signify possible Chronic  Kidney Disease.    Anion gap 10 5 - 15    Comment: Performed at [facility] [address] [place], [place] [address]  Ethanol     Status: None   Collection Time: 01/05/18 10:20 PM  Result Value Ref Range   Alcohol, Ethyl (B) <10 <10 mg/dL    Comment:        LOWEST DETECTABLE LIMIT  FOR SERUM ALCOHOL IS 10 mg/dL FOR MEDICAL PURPOSES ONLY Performed at [facility], [place] [address], [place], [place] [address]   Salicylate level     Status: None   Collection Time: 01/05/18 10:20 PM  Result Value Ref Range   Salicylate Lvl <7.6 2.8 - 30.0 mg/dL    Comment: Performed at [facility] [address] [place], [place] [address]  Acetaminophen level     Status: Abnormal   Collection Time: 01/05/18 10:20 PM  Result Value Ref Range   Acetaminophen (Tylenol), Serum <10 (L) 10 - 30 ug/mL    Comment:        THERAPEUTIC CONCENTRATIONS VARY SIGNIFICANTLY. A RANGE OF 10-30 ug/mL MAY BE AN EFFECTIVE CONCENTRATION FOR MANY PATIENTS. HOWEVER, SOME ARE BEST TREATED AT CONCENTRATIONS OUTSIDE THIS RANGE. ACETAMINOPHEN CONCENTRATIONS >150 ug/mL AT 4 HOURS AFTER INGESTION AND >50 ug/mL AT 12 HOURS AFTER INGESTION ARE OFTEN ASSOCIATED WITH TOXIC REACTIONS. Performed at [facility], [place] [address] [place], [place] [address]   cbc     Status: Abnormal   Collection Time: 01/05/18 10:20 PM  Result Value Ref Range   WBC 8.1 4.0 - 10.5 K/uL   RBC 4.17 3.87 - 5.11 MIL/uL   Hemoglobin 12.0 12.0 - 15.0 g/dL   HCT 35.4 (L) 36.0 - 46.0 %   MCV 84.9 78.0 - 100.0 fL   MCH 28.8 26.0 - 34.0 pg   MCHC 33.9 30.0 - 36.0 g/dL   RDW 12.2 11.5 - 15.5 %   Platelets 180 150 - 400 K/uL    Comment: Performed at [facility], [place] [address], [place], [place] [address]  I-Stat beta hCG blood, ED     Status: None   Collection Time: 01/05/18 10:32 PM  Result Value Ref Range   I-stat hCG, quantitative <5.0 <5 mIU/mL   Comment 3            Comment:   GEST. AGE      CONC.   (mIU/mL)   <=1 WEEK        5 - 50     2 WEEKS       50 - 500     3 WEEKS       100 - 10,000     4 WEEKS     1,000 - 30,000        FEMALE AND NON-PREGNANT FEMALE:     LESS THAN 5 mIU/mL   I-stat troponin, ED     Status: None   Collection Time: 01/05/18 10:32 PM  Result Value Ref Range   Troponin i, poc 0.01 0.00 - 0.08 ng/mL   Comment 3            Comment: Due to the release kinetics of cTnI, a negative result within the first hours of the onset of symptoms does not rule out myocardial infarction with certainty. If myocardial infarction is still suspected, repeat the test at appropriate intervals.   Rapid urine drug screen (hospital performed)     Status: Abnormal   Collection Time: 01/05/18 11:18 PM  Result Value Ref Range   Opiates NONE DETECTED NONE DETECTED   Cocaine NONE DETECTED NONE DETECTED   Benzodiazepines POSITIVE (A) NONE DETECTED   Amphetamines NONE DETECTED NONE DETECTED   Tetrahydrocannabinol POSITIVE (A) NONE DETECTED   Barbiturates NONE DETECTED NONE DETECTED    Comment: (NOTE) DRUG SCREEN FOR MEDICAL PURPOSES ONLY.  IF CONFIRMATION IS NEEDED FOR ANY PURPOSE,  NOTIFY LAB WITHIN 5 DAYS. LOWEST DETECTABLE LIMITS FOR URINE DRUG SCREEN Drug Class                     Cutoff (ng/mL) Amphetamine and metabolites    1000 Barbiturate and metabolites    200 Benzodiazepine                 852 Tricyclics and metabolites     300 Opiates and metabolites        300 Cocaine and metabolites        300 THC                            50 Performed at [facility], [place] [address], [place], [place] [address]     Medications:  Current Facility-Administered Medications  Medication Dose Route Frequency Provider Last Rate Last Dose  . [START ON 01/07/2018] potassium chloride SA (K-DUR,KLOR-CON) CR tablet 20 mEq  20 mEq Oral BID Ocie Cornfield T, PA-C      . potassium chloride SA (K-DUR,KLOR-CON) CR tablet 40 mEq  40 mEq Oral Once Doristine Devoid, PA-C        Current Outpatient Medications  Medication Sig Dispense Refill  . loratadine (CLARITIN) 10 MG tablet Take 10 mg by mouth daily as needed for allergies.    Marland Kitchen acetaminophen (TYLENOL) 500 MG tablet Take 1 tablet (500 mg total) by mouth every 6 (six) hours as needed. (Patient not taking: Reported on 05/26/2017) 30 tablet 0  . ondansetron (ZOFRAN ODT) 4 MG disintegrating tablet Take 1 tablet (4 mg total) by mouth every 8 (eight) hours as needed for nausea or vomiting. (Patient not taking: Reported on 05/26/2017) 20 tablet 0    Musculoskeletal: Strength & Muscle Tone: within normal limits Gait & Station: normal Patient leans: N/A  Psychiatric Specialty Exam: Physical Exam  Vitals reviewed. Neurological: She is alert.  Psychiatric: She has a normal mood and affect. Her behavior is normal.    Review of Systems  Psychiatric/Behavioral: Negative for hallucinations and suicidal ideas.    Blood pressure (!) 104/53, Russell 61, temperature 97.9 F (36.6 C), temperature source Oral, resp. rate 17, height 5' 5" (1.651 m), weight 58.5 kg (129 lb), last menstrual period 12/29/2017, SpO2 100 %, unknown if currently breastfeeding.Body mass index is 21.47 kg/m.  General Appearance: Casual  Eye Contact:  Fair  Speech:  Clear and Coherent  Volume:  Normal  Mood:  Anxious  Affect:  Congruent  Thought Process:  Coherent  Orientation:  Full (Time, Place, and Person)  Thought Content:  Hallucinations: None  Suicidal Thoughts:  No  Homicidal Thoughts:  No  Memory:  Immediate;   Fair Recent;   Fair Remote;   Fair  Judgement:  Fair  Insight:  Fair  Psychomotor Activity:  Normal  Concentration:  Concentration: Fair  Recall:  [employer] of Knowledge:  Fair  Language:  Fair  Akathisia:    Handed:    AIMS (if indicated):     Assets:  Communication Skills Desire for Improvement Social Support  ADL's:  Intact  Cognition:  WNL  Sleep:      19:48 -NP Spoke to MD Ralene Bathe regarding discharge  disposition. MD reports she will follow up with patient and IVC status.   Disposition: No evidence of imminent risk to self or others at present.   Patient does not meet criteria for psychiatric inpatient admission. Supportive therapy provided  about ongoing stressors. Refer to IOP. Discussed crisis plan, support from social network, calling 911, coming to the Emergency Department, and calling Suicide Hotline.  Patient to follow-up with [facility]  Additional outpatient Psychiatry and counseling to be provided  This service was provided via telemedicine using a 2-way, interactive audio and video technology.  Names of all persons participating in this telemedicine service and their role in this encounter. Name: Amanda Russell Role:Patient   Name: T.   Role: NP  Name: Vaughan Browner Role: Counselor        Derrill [place], NP 01/06/2018 7:15 PM

## 2018-01-06 NOTE — ED Notes (Signed)
Breakfast tray ordered 

## 2018-01-06 NOTE — ED Notes (Signed)
Dinner tray arrived 

## 2018-01-06 NOTE — ED Notes (Signed)
Ford with behavioral health called and advised the recommendation was for placement however there were no beds available so they will be seeking placement.

## 2018-01-06 NOTE — ED Notes (Signed)
Patient denies pain and is resting comfortably.  

## 2018-01-06 NOTE — BHH Counselor (Signed)
Met with pt at request of AC, due to pt not appearing to meet inpt criteria at this time. Via telepsych, pt presented with pleasant affect. She is oriented x 4. She denies SI, HI and AVH. Pt reports the effects of the marijuana she used last night caused her to behave carelessly. Pt admits missing her children who are in DSS custody but she is not a danger to herself or others. Ricky Ala, NP also met with pt and spoke with pt's [place] by telephone. Ricky Ala advised pt she could be released tonight if her mother picks her up from the hospital.

## 2018-01-25 ENCOUNTER — Encounter (HOSPITAL_COMMUNITY): Payer: Self-pay | Admitting: Emergency Medicine

## 2018-01-25 ENCOUNTER — Encounter (HOSPITAL_COMMUNITY): Payer: Self-pay | Admitting: *Deleted

## 2018-01-25 ENCOUNTER — Other Ambulatory Visit: Payer: Self-pay

## 2018-01-25 ENCOUNTER — Emergency Department (HOSPITAL_COMMUNITY)
Admission: EM | Admit: 2018-01-25 | Discharge: 2018-01-25 | Disposition: A | Discharge status: Home / Self Care | Payer: Self-pay | Attending: Emergency Medicine | Admitting: Emergency Medicine

## 2018-01-25 ENCOUNTER — Emergency Department (HOSPITAL_COMMUNITY)
Admission: EM | Admit: 2018-01-25 | Discharge: 2018-01-26 | Disposition: A | Discharge status: Home / Self Care | Payer: Self-pay | Attending: Emergency Medicine | Admitting: Emergency Medicine

## 2018-01-25 DIAGNOSIS — R21 Rash and other nonspecific skin eruption: Secondary | ICD-10-CM | POA: Insufficient documentation

## 2018-01-25 DIAGNOSIS — Z5321 Procedure and treatment not carried out due to patient leaving prior to being seen by health care provider: Secondary | ICD-10-CM | POA: Insufficient documentation

## 2018-01-25 DIAGNOSIS — Z114 Encounter for screening for human immunodeficiency virus [HIV]: Secondary | ICD-10-CM | POA: Insufficient documentation

## 2018-01-25 NOTE — ED Triage Notes (Signed)
Patient presents to ED for assessment of generalized rash, occasional red spot to her breast, or her lip (not currently present) or to her abdomen.  Patient states she has noted it since 2016.  States she is now concerned she may have herpes or HIV.  Denies known exposure.

## 2018-01-25 NOTE — ED Triage Notes (Signed)
The pt has a strange affect she  Wants to be checked for hiv  She has a small rash on her rt breast  She has a spotg on her lower lip  She rep[ports she has been in jail she will sit and stare straight ahead without speaking for a few seconds  lmp last month

## 2018-01-26 ENCOUNTER — Inpatient Hospital Stay (HOSPITAL_COMMUNITY)
Admission: AD | Admit: 2018-01-26 | Discharge: 2018-01-27 | Disposition: A | Discharge status: Home / Self Care | Payer: Self-pay | Source: Ambulatory Visit | Attending: Obstetrics and Gynecology | Admitting: Obstetrics and Gynecology

## 2018-01-26 DIAGNOSIS — R1030 Lower abdominal pain, unspecified: Secondary | ICD-10-CM | POA: Insufficient documentation

## 2018-01-26 DIAGNOSIS — F17210 Nicotine dependence, cigarettes, uncomplicated: Secondary | ICD-10-CM | POA: Insufficient documentation

## 2018-01-26 DIAGNOSIS — Z3202 Encounter for pregnancy test, result negative: Secondary | ICD-10-CM | POA: Insufficient documentation

## 2018-01-26 DIAGNOSIS — Z113 Encounter for screening for infections with a predominantly sexual mode of transmission: Secondary | ICD-10-CM | POA: Insufficient documentation

## 2018-01-26 DIAGNOSIS — N912 Amenorrhea, unspecified: Secondary | ICD-10-CM | POA: Insufficient documentation

## 2018-01-26 NOTE — ED Notes (Signed)
Called @ 04:20 - no answer

## 2018-01-26 NOTE — MAU Note (Signed)
Pt presents to MAU with complaints of lower abdominal pain and states she wants STD testing.

## 2018-01-27 ENCOUNTER — Encounter (HOSPITAL_COMMUNITY): Payer: Self-pay

## 2018-01-27 DIAGNOSIS — R1030 Lower abdominal pain, unspecified: Secondary | ICD-10-CM

## 2018-01-27 LAB — URINALYSIS, ROUTINE W REFLEX MICROSCOPIC
Bilirubin Urine: NEGATIVE
GLUCOSE, UA: NEGATIVE mg/dL
Hgb urine dipstick: NEGATIVE
KETONES UR: NEGATIVE mg/dL
LEUKOCYTES UA: NEGATIVE
Nitrite: NEGATIVE
PH: 6 (ref 5.0–8.0)
PROTEIN: NEGATIVE mg/dL
Specific Gravity, Urine: 1.021 (ref 1.005–1.030)

## 2018-01-27 LAB — CBC
HCT: 36.1 % (ref 36.0–46.0)
HEMOGLOBIN: 12.7 g/dL (ref 12.0–15.0)
MCH: 30 pg (ref 26.0–34.0)
MCHC: 35.2 g/dL (ref 30.0–36.0)
MCV: 85.1 fL (ref 78.0–100.0)
Platelets: 199 10*3/uL (ref 150–400)
RBC: 4.24 MIL/uL (ref 3.87–5.11)
RDW: 12.2 % (ref 11.5–15.5)
WBC: 7.4 10*3/uL (ref 4.0–10.5)

## 2018-01-27 LAB — POCT PREGNANCY, URINE: Preg Test, Ur: NEGATIVE

## 2018-01-27 LAB — HIV ANTIBODY (ROUTINE TESTING W REFLEX): HIV Screen 4th Generation wRfx: NONREACTIVE

## 2018-01-27 LAB — WET PREP, GENITAL
Clue Cells Wet Prep HPF POC: NONE SEEN
Sperm: NONE SEEN
Trich, Wet Prep: NONE SEEN
Yeast Wet Prep HPF POC: NONE SEEN

## 2018-01-27 LAB — HEPATITIS B SURFACE ANTIGEN: Hepatitis B Surface Ag: NEGATIVE

## 2018-01-27 LAB — RPR: RPR: NONREACTIVE

## 2018-01-27 NOTE — MAU Provider Note (Signed)
History     CSN: [id number]  Arrival date and time: 01/26/18 2327   First Provider Initiated Contact with Patient 01/27/18 0016      Chief Complaint  Patient presents with  . Vaginitis  . Possible Pregnancy   22 y.o. non-pregnant female here for LAP and to find out if she's pregnant. Her menses is late and she is not using contraception. She reports LAP. She is unable to give more information regarding the pain as she has flight of ideas and after being redirected cannot given more information. Denies urinary sx. She is also requesting to be checked for STDs. She did not answer if she has a new partner but said her baby daddy is coming home soon and she needs to be checked before. She states multiple times "I'm not talking to myself" while looking at me. She has a mental health hx and substance abuse and was recently seen in another ED for iradic behavior. She is not taking her psych meds.   Past Medical History:  Diagnosis Date  . ADHD (attention deficit hyperactivity disorder), combined type 07/14/2012  . Chlamydia   . Suicidal ideation     Past Surgical History:  Procedure Laterality Date  . NO PAST SURGERIES      Family History  Problem Relation Age of Onset  . Cancer Neg Hx   . Diabetes Neg Hx   . Hypertension Neg Hx     Social History   Tobacco Use  . Smoking status: Current Every Day Smoker    Packs/day: 0.50    Years: 0.80    Pack years: 0.40    Types: Cigarettes    Last attempt to quit: 01/07/2011    Years since quitting: 7.0  . Smokeless tobacco: Never Used  Substance Use Topics  . Alcohol use: Yes  . Drug use: Yes    Frequency: 7.0 times per week    Types: Marijuana    Comment: denies    Allergies: No Known Allergies  Medications Prior to Admission  Medication Sig Dispense Refill Last Dose  . loratadine (CLARITIN) 10 MG tablet Take 10 mg by mouth daily as needed for allergies.   Past Week at Unknown time    Review of Systems  Constitutional:  Negative for chills and fever.  Gastrointestinal: Positive for abdominal pain and nausea. Negative for constipation, diarrhea and vomiting.  Genitourinary: Negative for dysuria, frequency, urgency and vaginal bleeding.   Physical Exam   Blood pressure 112/84, pulse 85, temperature 98.2 F (36.8 C), resp. rate 18, weight 127 lb (57.6 kg), last menstrual period 12/29/2017, unknown if currently breastfeeding.  Physical Exam  Constitutional: She is oriented to person, place, and time. She appears well-developed and well-nourished. No distress.  HENT:  Head: Normocephalic and atraumatic.  Neck: Normal range of motion.  Genitourinary:  Genitourinary Comments: External: no lesions or erythema Vagina: rugated, pink, moist, thin yellow discharge Uterus: non enlarged, anteverted, non tender, no CMT Adnexae: no masses, no tenderness left, no tenderness right Cervix nml   Musculoskeletal: Normal range of motion.  Neurological: She is alert and oriented to person, place, and time.  Skin: Skin is warm and dry.  Psychiatric: Her speech is normal. Her mood appears anxious. Thought content is paranoid. Thought content is not delusional. She expresses no homicidal and no suicidal ideation.   Results for orders placed or performed during the hospital encounter of 01/26/18 (from the past 24 hour(s))  Urinalysis, Routine w reflex microscopic  Status: Abnormal   Collection Time: 01/26/18 11:45 PM  Result Value Ref Range   Color, Urine YELLOW YELLOW   APPearance HAZY (A) CLEAR   Specific Gravity, Urine 1.021 1.005 - 1.030   pH 6.0 5.0 - 8.0   Glucose, UA NEGATIVE NEGATIVE mg/dL   Hgb urine dipstick NEGATIVE NEGATIVE   Bilirubin Urine NEGATIVE NEGATIVE   Ketones, ur NEGATIVE NEGATIVE mg/dL   Protein, ur NEGATIVE NEGATIVE mg/dL   Nitrite NEGATIVE NEGATIVE   Leukocytes, UA NEGATIVE NEGATIVE  Pregnancy, urine POC     Status: None   Collection Time: 01/27/18 12:03 AM  Result Value Ref Range   Preg  Test, Ur NEGATIVE NEGATIVE  CBC     Status: None   Collection Time: 01/27/18 12:33 AM  Result Value Ref Range   WBC 7.4 4.0 - 10.5 K/uL   RBC 4.24 3.87 - 5.11 MIL/uL   Hemoglobin 12.7 12.0 - 15.0 g/dL   HCT 60.4 54.0 - 98.1 %   MCV 85.1 78.0 - 100.0 fL   MCH 30.0 26.0 - 34.0 pg   MCHC 35.2 30.0 - 36.0 g/dL   RDW 19.1 47.8 - 29.5 %   Platelets 199 150 - 400 K/uL  Wet prep, genital     Status: Abnormal   Collection Time: 01/27/18 12:45 AM  Result Value Ref Range   Yeast Wet Prep HPF POC NONE SEEN NONE SEEN   Trich, Wet Prep NONE SEEN NONE SEEN   Clue Cells Wet Prep HPF POC NONE SEEN NONE SEEN   WBC, Wet Prep HPF POC MODERATE (A) NONE SEEN   Sperm NONE SEEN    MAU Course  Procedures  MDM Labs ordered and reviewed. No evidence of UTI, pregnancy, or acute abdominal or pelvic process. GC pending. Discussed findings with pt, she verbalized understanding. Stable for discharge home.  Assessment and Plan   1. Lower abdominal pain   2. Amenorrhea   3. Pregnancy examination or test, negative result   4. Screen for STD (sexually transmitted disease)    Discharge home Follow up with PCP as needed Return to MAU for OBGYN emergencies  Allergies as of 01/27/2018   No Known Allergies     Medication List    TAKE these medications   diphenhydrAMINE 50 MG tablet Commonly known as:  BENADRYL Take 50 mg by mouth at bedtime as needed for itching.   loratadine 10 MG tablet Commonly known as:  CLARITIN Take 10 mg by mouth daily as needed for allergies.      Donette Larry, CNM 01/27/2018, 12:27 AM

## 2018-01-27 NOTE — Discharge Instructions (Signed)
Abdominal Pain, Adult Abdominal pain can be caused by many things. Often, abdominal pain is not serious and it gets better with no treatment or by being treated at home. However, sometimes abdominal pain is serious. Your health care provider will do a medical history and a physical exam to try to determine the cause of your abdominal pain. Follow these instructions at home:  Take over-the-counter and prescription medicines only as told by your health care provider. Do not take a laxative unless told by your health care provider.  Drink enough fluid to keep your urine clear or pale yellow.  Watch your condition for any changes.  Keep all follow-up visits as told by your health care provider. This is important. Contact a health care provider if:  Your abdominal pain changes or gets worse.  You are not hungry or you lose weight without trying.  You are constipated or have diarrhea for more than 2-3 days.  You have pain when you urinate or have a bowel movement.  Your abdominal pain wakes you up at night.  Your pain gets worse with meals, after eating, or with certain foods.  You are throwing up and cannot keep anything down.  You have a fever. Get help right away if:  Your pain does not go away as soon as your health care provider told you to expect.  You cannot stop throwing up.  Your pain is only in areas of the abdomen, such as the right side or the left lower portion of the abdomen.  You have bloody or black stools, or stools that look like tar.  You have severe pain, cramping, or bloating in your abdomen.  You have signs of dehydration, such as: ? Dark urine, very little urine, or no urine. ? Cracked lips. ? Dry mouth. ? Sunken eyes. ? Sleepiness. ? Weakness. This information is not intended to replace advice given to you by your health care provider. Make sure you discuss any questions you have with your health care provider. Document Released: 06/27/2005 Document  Revised: 04/06/2016 Document Reviewed: 02/29/2016 Elsevier Interactive Patient Education  2018 [employer]. Secondary Amenorrhea Secondary amenorrhea is the stopping of menstrual flow for 3-6 months in a female who has previously had periods. There are many possible causes. Most of these causes are not serious. Usually, treating the underlying problem causing the loss of menses will return your periods to normal. What are the causes? Some common and uncommon causes of not menstruating include:  Malnutrition.  Low blood sugar (hypoglycemia).  Polycystic ovary disease.  Stress or fear.  Breastfeeding.  Hormone imbalance.  Ovarian failure.  Medicines.  Extreme obesity.  Cystic fibrosis.  Low body weight or drastic weight reduction from any cause.  Early menopause.  Removal of ovaries or uterus.  Contraceptives.  Illness.  Long-term (chronic) illnesses.  Cushing syndrome.  Thyroid problems.  Birth control pills, patches, or vaginal rings for birth control.  What increases the risk? You may be at greater risk of secondary amenorrhea if:  You have a family history of this condition.  You have an eating disorder.  You do athletic training.  How is this diagnosed? A diagnosis is made by your health care provider taking a medical history and doing a physical exam. This will include a pelvic exam to check for problems with your reproductive organs. Pregnancy must be ruled out. Often, numerous blood tests are done to measure different hormones in the body. Urine testing may be done. Specialized exams (ultrasound, CT  scan, MRI, or hysteroscopy) may have to be done as well as measuring the body mass index (BMI). How is this treated? Treatment depends on the cause of the amenorrhea. If an eating disorder is present, this can be treated with an adequate diet and therapy. Chronic illnesses may improve with treatment of the illness. Amenorrhea may be corrected with  medicines, lifestyle changes, or surgery. If the amenorrhea cannot be corrected, it is sometimes possible to create a false menstruation with medicines. Follow these instructions at home:  Maintain a healthy diet.  Manage weight problems.  Exercise regularly but not excessively.  Get adequate sleep.  Manage stress.  Be aware of changes in your menstrual cycle. Keep a record of when your periods occur. Note the date your period starts, how long it lasts, and any problems. Contact a health care provider if: Your symptoms do not get better with treatment. This information is not intended to replace advice given to you by your health care provider. Make sure you discuss any questions you have with your health care provider. Document Released: 10/29/2006 Document Revised: 02/23/2016 Document Reviewed: 03/05/2013 Elsevier Interactive Patient Education  [employer].

## 2018-01-28 LAB — GC/CHLAMYDIA PROBE AMP (~~LOC~~) NOT AT ARMC
Chlamydia: NEGATIVE
Neisseria Gonorrhea: NEGATIVE

## 2018-02-28 ENCOUNTER — Encounter (HOSPITAL_COMMUNITY): Payer: Self-pay | Admitting: Emergency Medicine

## 2018-02-28 ENCOUNTER — Emergency Department (HOSPITAL_COMMUNITY)
Admission: EM | Admit: 2018-02-28 | Discharge: 2018-02-28 | Disposition: A | Discharge status: Home / Self Care | Payer: Self-pay | Attending: Emergency Medicine | Admitting: Emergency Medicine

## 2018-02-28 ENCOUNTER — Other Ambulatory Visit: Payer: Self-pay

## 2018-02-28 DIAGNOSIS — Z0441 Encounter for examination and observation following alleged adult rape: Secondary | ICD-10-CM | POA: Insufficient documentation

## 2018-02-28 DIAGNOSIS — F17210 Nicotine dependence, cigarettes, uncomplicated: Secondary | ICD-10-CM | POA: Insufficient documentation

## 2018-02-28 DIAGNOSIS — Y939 Activity, unspecified: Secondary | ICD-10-CM | POA: Insufficient documentation

## 2018-02-28 DIAGNOSIS — Y999 Unspecified external cause status: Secondary | ICD-10-CM | POA: Insufficient documentation

## 2018-02-28 DIAGNOSIS — Y929 Unspecified place or not applicable: Secondary | ICD-10-CM | POA: Insufficient documentation
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
## 2018-03-25 ENCOUNTER — Encounter (HOSPITAL_COMMUNITY): Payer: Self-pay | Admitting: Emergency Medicine

## 2018-03-25 ENCOUNTER — Inpatient Hospital Stay (HOSPITAL_COMMUNITY)
Admission: AD | Admit: 2018-03-25 | Discharge: 2018-03-25 | Disposition: A | Discharge status: Home / Self Care | Payer: Self-pay | Source: Ambulatory Visit | Attending: Obstetrics | Admitting: Obstetrics

## 2018-03-25 ENCOUNTER — Other Ambulatory Visit: Payer: Self-pay

## 2018-03-25 ENCOUNTER — Ambulatory Visit (HOSPITAL_COMMUNITY)
Admission: EM | Admit: 2018-03-25 | Discharge: 2018-03-25 | Disposition: A | Discharge status: Home / Self Care | Payer: Self-pay | Attending: Family Medicine | Admitting: Family Medicine

## 2018-03-25 ENCOUNTER — Emergency Department (HOSPITAL_COMMUNITY)
Admission: EM | Admit: 2018-03-25 | Discharge: 2018-03-25 | Discharge status: Against Medical Advice | Payer: Self-pay | Attending: Emergency Medicine | Admitting: Emergency Medicine

## 2018-03-25 ENCOUNTER — Encounter (HOSPITAL_COMMUNITY): Payer: Self-pay | Admitting: *Deleted

## 2018-03-25 DIAGNOSIS — F909 Attention-deficit hyperactivity disorder, unspecified type: Secondary | ICD-10-CM | POA: Insufficient documentation

## 2018-03-25 DIAGNOSIS — Z5321 Procedure and treatment not carried out due to patient leaving prior to being seen by health care provider: Secondary | ICD-10-CM | POA: Insufficient documentation

## 2018-03-25 DIAGNOSIS — Z711 Person with feared health complaint in whom no diagnosis is made: Secondary | ICD-10-CM

## 2018-03-25 DIAGNOSIS — Z0389 Encounter for observation for other suspected diseases and conditions ruled out: Secondary | ICD-10-CM | POA: Insufficient documentation

## 2018-03-25 DIAGNOSIS — F17210 Nicotine dependence, cigarettes, uncomplicated: Secondary | ICD-10-CM | POA: Insufficient documentation

## 2018-03-25 DIAGNOSIS — T192XXA Foreign body in vulva and vagina, initial encounter: Secondary | ICD-10-CM

## 2018-03-25 DIAGNOSIS — R102 Pelvic and perineal pain: Secondary | ICD-10-CM

## 2018-03-25 LAB — WET PREP, GENITAL
CLUE CELLS WET PREP: NONE SEEN
Sperm: NONE SEEN
TRICH WET PREP: NONE SEEN
YEAST WET PREP: NONE SEEN

## 2018-03-25 MED ORDER — IBUPROFEN 800 MG PO TABS: 800.0000 mg | ORAL_TABLET | Freq: Three times a day (TID) | ORAL | 0 refills | Status: DC

## 2018-03-25 NOTE — Discharge Instructions (Addendum)
No tampon or foreign body seen in vagina, likely fell out without knowing given heavy cycle  Please take Tylenol and ibuprofen for pelvic pain, please follow-up if symptoms not resolving with resolution of menstrual cycle.

## 2018-03-25 NOTE — ED Provider Notes (Signed)
[facility]    CSN: [id number] Arrival date & time: 03/25/18  1115     History   Chief Complaint Chief Complaint  Patient presents with  . Foreign Body in Vagina    HPI Amanda Russell is a 22 y.o. female presenting today for evaluation of possible tampon in vagina.  Patient states that she inserted a tampon last night around 8 PM.  She has been unable to find or remove the tampon since.  She has also noted some left pelvic pain and believes it is located on the left side.  [facility] and had an exam that revealed no tampon or foreign body.  Patient's menstrual cycle began on Sunday, she notes that it has been heavier than normal for her.  She denies any sexual intercourse or trauma to the vaginal area.  Denies any discharge.  HPI  Past Medical History:  Diagnosis Date  . ADHD (attention deficit hyperactivity disorder), combined type 07/14/2012  . Chlamydia   . Suicidal ideation     Patient Active Problem List   Diagnosis Date Noted  . 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 Preterm premature rupture of membranes (PPROM) with unknown onset of labor 07/13/2014  . NVD (normal vaginal delivery) 07/13/2014  . Suicidal ideation 07/14/2012  . Depression 07/14/2012  . ADHD (attention deficit hyperactivity disorder), combined type 07/14/2012  . Oppositional defiant disorder 07/14/2012  . Parent-child relational problem 07/14/2012    Past Surgical History:  Procedure Laterality Date  . NO PAST SURGERIES      OB History    Gravida  2   Para  2   Term  1   Preterm  1   AB      Living  2     SAB      TAB      Ectopic      Multiple      Live Births  2            Home Medications    Prior to Admission medications   Medication Sig Start Date End Date Taking? Authorizing Provider  ibuprofen (ADVIL,MOTRIN) 800 MG tablet Take 1 tablet (800 mg total) by mouth 3 (three) times daily. 03/25/18    Wieters, Junius CreamerHallie C, PA-C    Family History Family History  Problem Relation Age of Onset  . Hypertension Other   . Cancer Neg Hx   . Diabetes Neg Hx     Social History Social History   Tobacco Use  . Smoking status: Current Every Day Smoker    Packs/day: 0.50    Years: 0.80    Pack years: 0.40    Types: Cigarettes    Last attempt to quit: 01/07/2011    Years since quitting: 7.2  . Smokeless tobacco: Never Used  Substance Use Topics  . Alcohol use: Yes    Comment: socially  . Drug use: Not Currently    Frequency: 7.0 times per week    Types: Marijuana    Comment: denies     Allergies   Pollen extract   Review of Systems Review of Systems  Constitutional: Negative for fever.  Respiratory: Negative for shortness of breath.   Cardiovascular: Negative for chest pain.  Gastrointestinal: Negative for abdominal pain, diarrhea, nausea and vomiting.  Genitourinary: Positive for dysuria, menstrual problem, pelvic pain, vaginal bleeding and vaginal pain. Negative for flank pain, frequency, genital sores, hematuria, urgency and vaginal discharge.  Musculoskeletal:  Negative for back pain.  Skin: Negative for rash.  Neurological: Negative for dizziness, light-headedness and headaches.     Physical Exam Triage Vital Signs ED Triage Vitals  Enc Vitals Group     BP 03/25/18 1201 119/74     Pulse Rate 03/25/18 1201 88     Resp 03/25/18 1201 16     Temp 03/25/18 1201 98.1 F (36.7 C)     Temp Source 03/25/18 1201 Oral     SpO2 03/25/18 1201 98 %     Weight 03/25/18 1200 143 lb (64.9 kg)     Height --      Head Circumference --      Peak Flow --      Pain Score 03/25/18 1200 9     Pain Loc --      Pain Edu? --      Excl. in GC? --    No data found.  Updated Vital Signs BP 119/74   Pulse 88   Temp 98.1 F (36.7 C) (Oral)   Resp 16   Wt 143 lb (64.9 kg)   LMP 03/23/2018   SpO2 98%   BMI 23.80 kg/m   Visual Acuity Right Eye Distance:   Left Eye Distance:     Bilateral Distance:    Right Eye Near:   Left Eye Near:    Bilateral Near:     Physical Exam  Constitutional: She appears well-developed and well-nourished. No distress.  HENT:  Head: Normocephalic and atraumatic.  Eyes: Conjunctivae are normal.  Neck: Neck supple.  Cardiovascular: Normal rate.  No murmur heard. Pulmonary/Chest: Effort normal. No respiratory distress.  Abdominal: Soft. There is no tenderness.  Genitourinary:  Genitourinary Comments: Vaginal vault with pink mucosa, normal rugated, cervix pink without erythema, moderate amount of dark red blood in vaginal vault, no foreign body or tampon visualized, no signs of Bartholin's abscess.  Musculoskeletal: She exhibits no edema.  Neurological: She is alert.  Skin: Skin is warm and dry.  Psychiatric: She has a normal mood and affect.  Nursing note and vitals reviewed.    UC Treatments / Results  Labs (all labs ordered are listed, but only abnormal results are displayed) Labs Reviewed - No data to display  EKG None  Radiology No results found.  Procedures Procedures (including critical care time)  Medications Ordered in UC Medications - No data to display  Initial Impression / Assessment and Plan / UC Course  I have reviewed the triage vital signs and the nursing notes.  Pertinent labs & imaging results that were available during my care of the patient were reviewed by me and considered in my medical decision making (see chart for details).     Tampon likely came out on its own given heavy cycle, no foreign body seen.  Will have patient take Tylenol and ibuprofen for pain in the meantime and see if pain resolves with resolution of menstrual cycle.  Follow-up if symptoms not improving.Discussed strict return precautions. Patient verbalized understanding and is agreeable with plan.  Final Clinical Impressions(s) / UC Diagnoses   Final diagnoses:  Vaginal foreign body, initial encounter     Discharge  Instructions     No tampon or foreign body seen in vagina, likely fell out without knowing given heavy cycle  Please take Tylenol and ibuprofen for pelvic pain, please follow-up if symptoms not resolving with resolution of menstrual cycle.   ED Prescriptions    Medication Sig Dispense Auth. Provider   ibuprofen (ADVIL,MOTRIN)  800 MG tablet Take 1 tablet (800 mg total) by mouth 3 (three) times daily. 21 tablet Wieters, [place] C, PA-C     Controlled Substance Prescriptions  Controlled Substance Registry consulted? Not Applicable   Lew Dawes, [place] 03/25/18 1227

## 2018-03-25 NOTE — MAU Note (Signed)
Reported to bedside with discharge paperwork.  Patient was actively attempting to extricate tampon from her vagina using her fingers.  Patient was told that the provider did not find a tampon in her vagina anywhere despite a thorough examination.  The patient stated that we "pushed it up further and now it's more lost inside of her."  Patient was educated on the length of the vaginal canal and again reassured of the provider's thorough exam.  Patient was tearful and left without allowing RN to obtain vital signs or go over paperwork.

## 2018-03-25 NOTE — ED Triage Notes (Signed)
PT reports she thinks she has a tampon stuck in her vagina. PT has [facility]. PT reports it has been in since 8pm last night.

## 2018-03-25 NOTE — ED Triage Notes (Signed)
Pt arriving POV stating she has a tampon stuck inside her vagina. [facility]. Pt was examined and nothing was found. Pt left prior facility after staff attempted to explain that there was nothing stuck inside her. Pt crying and insisting that there is a tampon inside her and that she feels it going down into her legs.

## 2018-03-25 NOTE — ED Notes (Signed)
NT Joy entered room to console crying pt. Attempted to reassure her that we would have her seen by a physician shortly. Pt continued to state "there's something stuck in there, I know it is". Pt was reassured again that she would be seen. After NT left the room, pt shortly walked out stating "I'm out of here! What the fuck! They're acting like I don't know if I put a tampon in". Pt walked out to lobby then out the side exit.

## 2018-03-25 NOTE — MAU Note (Signed)
PT SAYS SHE STARTED CYCLE ON Sunday-     TONIGHT  AT 0130- WENT TO B-ROOM TO CHANGE  TAMPON- SHE CANNOT FIND STRING  SHE FEELS TAMPON- BUT CANNOT  GET IT.

## 2018-03-25 NOTE — MAU Provider Note (Signed)
History     CSN: [id number]  Arrival date and time: 03/25/18 0208  Chief Complaint  Patient presents with  . Foreign Body in Vagina   22 y.o. Female here with suspected retained tampon. She thinks tampon has been in since yesterday. It feels like it's inside but she cannot find the string. Menses started yesterday. No pain. No fevers.   Past Medical History:  Diagnosis Date  . ADHD (attention deficit hyperactivity disorder), combined type 07/14/2012  . Chlamydia   . Suicidal ideation     Past Surgical History:  Procedure Laterality Date  . NO PAST SURGERIES      Family History  Problem Relation Age of Onset  . Hypertension Other   . Cancer Neg Hx   . Diabetes Neg Hx     Social History   Tobacco Use  . Smoking status: Current Every Day Smoker    Packs/day: 0.50    Years: 0.80    Pack years: 0.40    Types: Cigarettes    Last attempt to quit: 01/07/2011    Years since quitting: 7.2  . Smokeless tobacco: Never Used  Substance Use Topics  . Alcohol use: Yes    Comment: socially  . Drug use: Not Currently    Frequency: 7.0 times per week    Types: Marijuana    Comment: denies    Allergies:  Allergies  Allergen Reactions  . Pollen Extract     Medications Prior to Admission  Medication Sig Dispense Refill Last Dose  . diphenhydrAMINE (BENADRYL) 50 MG tablet Take 50 mg by mouth at bedtime as needed for itching.   03/24/2018 at Unknown time  . loratadine (CLARITIN) 10 MG tablet Take 10 mg by mouth daily as needed for allergies.   More than a month at Unknown time    Review of Systems  Constitutional: Negative for fever.  Gastrointestinal: Negative.   Genitourinary: Positive for vaginal bleeding.   Physical Exam   Blood pressure 125/71, pulse 70, temperature 98.3 F (36.8 C), temperature source Oral, resp. rate 20, height 5\' 5"  (1.651 m), weight 143 lb 8 oz (65.1 kg), last menstrual period 03/23/2018, unknown if currently breastfeeding.  Physical Exam   Constitutional: She is oriented to person, place, and time. She appears well-developed and well-nourished. No distress.  HENT:  Head: Normocephalic and atraumatic.  Neck: Normal range of motion.  Cardiovascular: Normal rate.  Respiratory: Effort normal. No respiratory distress.  Genitourinary:  Genitourinary Comments: External: no lesions or erythema Speculum: vagina rugated, pink, moist, scant bloody discharge, cervix closed, nml, no tampon or foreign body seen Bimanual exam: no tampon or foreign body palpated   Musculoskeletal: Normal range of motion.  Neurological: She is alert and oriented to person, place, and time.  Skin: Skin is warm and dry.  Psychiatric: She has a normal mood and affect.   No results found for this or any previous visit (from the past 24 hour(s)).  MAU Course  Procedures  MDM No foreign body identified, pt informed. She states she knows there is a tampon inside "down there". I clarified that she is referring to the vagina and she confirmed this. I tried to reassure her that there is no foreign body in her vagina but she became angry and got up to get dressed. She left before signing discharge papers.   Assessment and Plan   1. Physically well but worried    Discharge home Follow-up with PCP as needed  Allergies as of 03/25/2018  Reactions   Pollen Extract       Medication List    TAKE these medications   diphenhydrAMINE 50 MG tablet Commonly known as:  BENADRYL Take 50 mg by mouth at bedtime as needed for itching.   loratadine 10 MG tablet Commonly known as:  CLARITIN Take 10 mg by mouth daily as needed for allergies.      Donette LarryMelanie Renda Pohlman, CNM 03/25/2018, 2:50 AM

## 2018-03-26 LAB — GC/CHLAMYDIA PROBE AMP (~~LOC~~) NOT AT ARMC
Chlamydia: NEGATIVE
Neisseria Gonorrhea: NEGATIVE

## 2018-05-14 ENCOUNTER — Ambulatory Visit (HOSPITAL_COMMUNITY)
Admission: EM | Admit: 2018-05-14 | Discharge: 2018-05-14 | Disposition: A | Discharge status: Home / Self Care | Payer: Self-pay | Attending: Family Medicine | Admitting: Family Medicine

## 2018-05-14 DIAGNOSIS — N760 Acute vaginitis: Secondary | ICD-10-CM | POA: Insufficient documentation

## 2018-05-14 DIAGNOSIS — Z8249 Family history of ischemic heart disease and other diseases of the circulatory system: Secondary | ICD-10-CM | POA: Insufficient documentation

## 2018-05-14 DIAGNOSIS — Z9109 Other allergy status, other than to drugs and biological substances: Secondary | ICD-10-CM | POA: Insufficient documentation

## 2018-05-14 MED ORDER — FLUCONAZOLE 150 MG PO TABS: ORAL_TABLET | ORAL | 1 refills | Status: DC

## 2018-05-14 NOTE — ED Triage Notes (Signed)
Pt c/o yeast infection with discharge. 5 days..Marland Kitchen

## 2018-05-14 NOTE — Discharge Instructions (Addendum)
We have sent testing for sexually transmitted infections. We will notify you of any positive results once they are received. If required, we will prescribe any medications you might need.  Please refrain from all sexual activity until your results are back.

## 2018-05-14 NOTE — ED Provider Notes (Signed)
[facility]   [id number] 05/14/18 Arrival Time: 1206  ASSESSMENT & PLAN:  1. Acute vaginitis    Meds ordered this encounter  Medications  . fluconazole (DIFLUCAN) 150 MG tablet    Sig: Take one tablet by mouth as a single dose. May repeat in 3 days if needed.    Dispense:  1 tablet    Refill:  1   Will treat empirically for yeast vaginitis.   Discharge Instructions     We have sent testing for sexually transmitted infections. We will notify you of any positive results once they are received. If required, we will prescribe any medications you might need.  Please refrain from all sexual activity until your results are back.     Pending: Labs Reviewed  CERVICOVAGINAL ANCILLARY ONLY   Will notify of any positive results. Instructed to refrain from sexual activity for at least seven days.  Reviewed expectations re: course of current medical issues. Questions answered. Outlined signs and symptoms indicating need for more acute intervention. Patient verbalized understanding. After Visit Summary given.   SUBJECTIVE:  Amanda Russell is a 22 y.o. female who presents with complaint of vaginal discharge. Onset gradual, several days ago. Describes discharge as thick and white. Associated vaginal itching. Urinary symptoms: none. Afebrile. No abdominal or pelvic pain. No n/v. No rashes or lesions. Sexually active with single female partner. OTC treatment: none. History of similar symptoms: yes; yeast infection.  Patient's last menstrual period was 05/07/2018.  ROS: As per HPI.  OBJECTIVE:  Vitals:   05/14/18 1222 05/14/18 1223 05/14/18 1224 05/14/18 1241  BP:   (!) 77/62 105/68  Pulse: 86     Resp: 16     Temp: 98.6 F (37 C)     SpO2: 100%     Weight:  65.8 kg       General appearance: alert, cooperative, appears stated age and no distress Throat: lips, mucosa, and tongue normal; teeth and gums normal Back: no CVA tenderness Abdomen: soft, non-tender GU:  deferred Skin: warm and dry Psychological: alert and cooperative. Normal mood and affect.   Allergies  Allergen Reactions  . Pollen Extract     Past Medical History:  Diagnosis Date  . ADHD (attention deficit hyperactivity disorder), combined type 07/14/2012  . Chlamydia   . Suicidal ideation    Family History  Problem Relation Age of Onset  . Hypertension Other   . Cancer Neg Hx   . Diabetes Neg Hx    Social History   Socioeconomic History  . Marital status: Single    Spouse name: Not on file  . Number of children: Not on file  . Years of education: Not on file  . Highest education level: Not on file  Occupational History  . 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 Financial resource strain: Not on file  . Food insecurity:    Worry: Not on file    Inability: Not on file  . Transportation needs:    Medical: Not on file    Non-medical: Not on file  Tobacco Use  . Smoking status: Current Every Day Smoker    Packs/day: 0.50    Years: 0.80    Pack years: 0.40    Types: Cigarettes    Last attempt to quit: 01/07/2011    Years since quitting: 7.3  . Smokeless tobacco: Never Used  Substance and Sexual Activity  . Alcohol use: Yes  Comment: socially  . Drug use: Not Currently    Frequency: 7.0 times per week    Types: Marijuana    Comment: denies  . Sexual activity: Yes    Partners: Male    Birth control/protection: Pill, None    Comment: Patient states her last time taking BCP was last year  Lifestyle  . Physical activity:    Days per week: Not on file    Minutes per session: Not on file  . Stress: Not on file  Relationships  . Social connections:    Talks on phone: Not on file    Gets together: Not on file    Attends religious service: Not on file    Active member of club or organization: Not on file    Attends meetings of clubs or organizations: Not on file    Relationship status: Not on file  . Intimate partner  violence:    Fear of current or ex partner: Not on file    Emotionally abused: Not on file    Physically abused: Not on file    Forced sexual activity: Not on file  Other Topics Concern  . Not on file  Social History Narrative  . Not on file         Mardella LaymanHagler, Tranice Laduke, MD 05/14/18 1302

## 2018-05-15 LAB — CERVICOVAGINAL ANCILLARY ONLY
BACTERIAL VAGINITIS: NEGATIVE
CANDIDA VAGINITIS: POSITIVE — AB
CHLAMYDIA, DNA PROBE: NEGATIVE
Neisseria Gonorrhea: NEGATIVE
Trichomonas: NEGATIVE

## 2018-07-26 ENCOUNTER — Inpatient Hospital Stay (HOSPITAL_COMMUNITY)
Admission: AD | Admit: 2018-07-26 | Discharge: 2018-07-27 | Disposition: A | Discharge status: Home / Self Care | Payer: Self-pay | Source: Ambulatory Visit | Attending: Obstetrics & Gynecology | Admitting: Obstetrics & Gynecology

## 2018-07-26 DIAGNOSIS — Z9109 Other allergy status, other than to drugs and biological substances: Secondary | ICD-10-CM | POA: Insufficient documentation

## 2018-07-26 DIAGNOSIS — F17210 Nicotine dependence, cigarettes, uncomplicated: Secondary | ICD-10-CM | POA: Insufficient documentation

## 2018-07-26 DIAGNOSIS — B9689 Other specified bacterial agents as the cause of diseases classified elsewhere: Secondary | ICD-10-CM | POA: Insufficient documentation

## 2018-07-26 DIAGNOSIS — N760 Acute vaginitis: Secondary | ICD-10-CM | POA: Insufficient documentation

## 2018-07-26 LAB — URINALYSIS, ROUTINE W REFLEX MICROSCOPIC
BILIRUBIN URINE: NEGATIVE
GLUCOSE, UA: NEGATIVE mg/dL
Hgb urine dipstick: NEGATIVE
KETONES UR: 20 mg/dL — AB
Nitrite: NEGATIVE
PH: 5 (ref 5.0–8.0)
PROTEIN: NEGATIVE mg/dL
Specific Gravity, Urine: 1.011 (ref 1.005–1.030)

## 2018-07-26 LAB — POCT PREGNANCY, URINE: Preg Test, Ur: NEGATIVE

## 2018-07-26 NOTE — MAU Note (Signed)
Having white vag d/c with odor for 2 wks. Some pain in L groin. No appetite

## 2018-07-27 DIAGNOSIS — B9689 Other specified bacterial agents as the cause of diseases classified elsewhere: Secondary | ICD-10-CM

## 2018-07-27 DIAGNOSIS — N760 Acute vaginitis: Secondary | ICD-10-CM

## 2018-07-27 LAB — RAPID URINE DRUG SCREEN, HOSP PERFORMED
AMPHETAMINES: NOT DETECTED
Barbiturates: NOT DETECTED
Benzodiazepines: NOT DETECTED
Cocaine: NOT DETECTED
Opiates: NOT DETECTED
TETRAHYDROCANNABINOL: POSITIVE — AB

## 2018-07-27 LAB — WET PREP, GENITAL
Sperm: NONE SEEN
TRICH WET PREP: NONE SEEN
YEAST WET PREP: NONE SEEN

## 2018-07-27 LAB — HEPATITIS C ANTIBODY: HCV Ab: <0.1 s/co ratio (ref 0.0–0.9)

## 2018-07-27 LAB — HIV ANTIBODY (ROUTINE TESTING W REFLEX): HIV SCREEN 4TH GENERATION: NONREACTIVE

## 2018-07-27 LAB — RPR: RPR: NONREACTIVE

## 2018-07-27 MED ORDER — FLUCONAZOLE 150 MG PO TABS: ORAL_TABLET | ORAL | 1 refills | Status: DC

## 2018-07-27 MED ORDER — METRONIDAZOLE 500 MG PO TABS: 500.0000 mg | ORAL_TABLET | Freq: Two times a day (BID) | ORAL | 0 refills | Status: AC

## 2018-07-27 NOTE — MAU Note (Signed)
Pt instructed in self swab for vag infections. Pt agrees and will wait in lobby for results.

## 2018-07-27 NOTE — MAU Note (Addendum)
Sharen Counter CNM in Triage to discuss test results and d/c plan with pt. Pt requested blood work for HIV. Blood drawn while in Triage. Pt then d/c home with d/c instructions given by CNM

## 2018-07-27 NOTE — MAU Provider Note (Signed)
Chief Complaint: Abdominal Pain   None     SUBJECTIVE HPI: Amanda Russell is a 22 y.o. [id number] who presents to maternity admissions reporting vaginal discharge with odor x 3 weeks.  There are no other symptoms. She has not tried any treatments.  The discharge is thin and clear/white.   She denies vaginal bleeding, vaginal itching/burning, urinary symptoms, h/a, dizziness, n/v, or fever/chills.     HPI  Past Medical History:  Diagnosis Date  . ADHD (attention deficit hyperactivity disorder), combined type 07/14/2012  . Chlamydia   . Suicidal ideation    Past Surgical History:  Procedure Laterality Date  . NO PAST SURGERIES     Social History   Socioeconomic History  . Marital status: Single    Spouse name: Not on file  . Number of children: Not on file  . Years of education: Not on file  . Highest education level: Not on file  Occupational History  . 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 Financial resource strain: Not on file  . Food insecurity:    Worry: Not on file    Inability: Not on file  . Transportation needs:    Medical: Not on file    Non-medical: Not on file  Tobacco Use  . Smoking status: Current Every Day Smoker    Packs/day: 0.50    Years: 0.80    Pack years: 0.40    Types: Cigarettes    Last attempt to quit: 01/07/2011    Years since quitting: 7.5  . Smokeless tobacco: Never Used  Substance and Sexual Activity  . Alcohol use: Yes    Comment: socially  . Drug use: Not Currently    Frequency: 7.0 times per week    Types: Marijuana    Comment: denies  . Sexual activity: Yes    Partners: Male    Birth control/protection: Pill, None    Comment: Patient states her last time taking BCP was last year  Lifestyle  . Physical activity:    Days per week: Not on file    Minutes per session: Not on file  . Stress: Not on file  Relationships  . Social connections:    Talks on phone: Not on file    Gets together: Not  on file    Attends religious service: Not on file    Active member of club or organization: Not on file    Attends meetings of clubs or organizations: Not on file    Relationship status: Not on file  . Intimate partner violence:    Fear of current or ex partner: Not on file    Emotionally abused: Not on file    Physically abused: Not on file    Forced sexual activity: Not on file  Other Topics Concern  . Not on file  Social History Narrative  . Not on file   No current facility-administered medications on file prior to encounter.    Current Outpatient Medications on File Prior to Encounter  Medication Sig Dispense Refill  . fluconazole (DIFLUCAN) 150 MG tablet Take one tablet by mouth as a single dose. May repeat in 3 days if needed. 1 tablet 1   Allergies  Allergen Reactions  . Pollen Extract     ROS:  Review of Systems  Constitutional: Negative for chills, fatigue and fever.  Respiratory: Negative for shortness of breath.   Cardiovascular: Negative for chest pain.  Gastrointestinal: Positive for vomiting.  Negative for diarrhea and nausea.  Genitourinary: Positive for vaginal discharge. Negative for difficulty urinating, dysuria, flank pain, pelvic pain, vaginal bleeding and vaginal pain.  Neurological: Negative for dizziness and headaches.  Psychiatric/Behavioral: Negative.      I have reviewed patient's Past Medical Hx, Surgical Hx, Family Hx, Social Hx, medications and allergies.   Physical Exam   Patient Vitals for the past 24 hrs:  BP Temp Pulse Resp Height Weight  07/26/18 2204 124/81 - 82 - - -  07/26/18 2203 - 98.6 F (37 C) - 18 5\' 5"  (1.651 m) 60.3 kg   Constitutional: Well-developed, well-nourished female in no acute distress.  Cardiovascular: normal rate Respiratory: normal effort GI: Abd soft, non-tender. Pos BS x 4 MS: Extremities nontender, no edema, normal ROM Neurologic: Alert and oriented x 4.  GU: Neg CVAT.  PELVIC EXAM: Wet prep and GCC  collected by blind swab   LAB RESULTS Results for orders placed or performed during the hospital encounter of 07/26/18 (from the past 24 hour(s))  Urinalysis, Routine w reflex microscopic     Status: Abnormal   Collection Time: 07/26/18 10:18 PM  Result Value Ref Range   Color, Urine YELLOW YELLOW   APPearance HAZY (A) CLEAR   Specific Gravity, Urine 1.011 1.005 - 1.030   pH 5.0 5.0 - 8.0   Glucose, UA NEGATIVE NEGATIVE mg/dL   Hgb urine dipstick NEGATIVE NEGATIVE   Bilirubin Urine NEGATIVE NEGATIVE   Ketones, ur 20 (A) NEGATIVE mg/dL   Protein, ur NEGATIVE NEGATIVE mg/dL   Nitrite NEGATIVE NEGATIVE   Leukocytes, UA TRACE (A) NEGATIVE   RBC / HPF 0-5 0 - 5 RBC/hpf   WBC, UA 0-5 0 - 5 WBC/hpf   Bacteria, UA RARE (A) NONE SEEN   Squamous Epithelial / LPF 11-20 0 - 5   Mucus PRESENT   Urine rapid drug screen (hosp performed)     Status: Abnormal   Collection Time: 07/26/18 10:18 PM  Result Value Ref Range   Opiates NONE DETECTED NONE DETECTED   Cocaine NONE DETECTED NONE DETECTED   Benzodiazepines NONE DETECTED NONE DETECTED   Amphetamines NONE DETECTED NONE DETECTED   Tetrahydrocannabinol POSITIVE (A) NONE DETECTED   Barbiturates NONE DETECTED NONE DETECTED  Pregnancy, urine POC     Status: None   Collection Time: 07/26/18 10:23 PM  Result Value Ref Range   Preg Test, Ur NEGATIVE NEGATIVE  Wet prep, genital     Status: Abnormal   Collection Time: 07/26/18 11:42 PM  Result Value Ref Range   Yeast Wet Prep HPF POC NONE SEEN NONE SEEN   Trich, Wet Prep NONE SEEN NONE SEEN   Clue Cells Wet Prep HPF POC PRESENT (A) NONE SEEN   WBC, Wet Prep HPF POC MANY (A) NONE SEEN   Sperm NONE SEEN        IMAGING No results found.  MAU Management/MDM: Ordered labs and reviewed results.  Wet prep c/w BV. Rx for Flagyl 500 mg BID x 7 days sent to pharmacy.  F/U with OB/Gyn provider, list provided.  Return to MAU for emergencies. t discharged with strict return  precautions.  ASSESSMENT 1. Bacterial vaginosis     PLAN Discharge home Allergies as of 07/27/2018      Reactions   Pollen Extract       Medication List    TAKE these medications   fluconazole 150 MG tablet Commonly known as:  DIFLUCAN Take one tablet by mouth as a single dose. May  repeat in 3 days if needed.   metroNIDAZOLE 500 MG tablet Commonly known as:  FLAGYL Take 1 tablet (500 mg total) by mouth 2 (two) times daily for 7 days.      Follow-up Information    OB/Gyn of your choice Follow up.   Why:  See list provided          Sharen Counter Certified Nurse-Midwife 07/27/2018  12:55 AM

## 2018-07-27 NOTE — Discharge Instructions (Signed)
Some natural remedies/prevention to try for bacterial vaginosis: Take a probiotic tablet/capsule every day for at least 1-2 months.   Whenever you have symptoms, use boric acid suppositories vaginally every night for a week.   Do not use scented soaps/perfumes in the vaginal area and wear breathable cotton underwear and do not wear tight restrictive clothing.    [facility] [employer] for [employer] at [facility]       Phone: [phone]  Center for [employer] at [place] Phone: [phone]  Center for [employer] at [place]  Phone: [phone]  Center for [facility] at [employer]  Phone: [phone]  Center for [facility] at [place]  Phone: [phone]  [facility]       Phone: [phone]  [facility]    Phone: [phone]   [facility] [place])    Phone: [phone]  [facility]    Phone: [phone]  [facility]    Phone: [phone]  [facility]    Phone: [phone]  [facility]       Phone: [phone]   [facility]  Phone: [phone]  [facility]    Phone: [phone]  [facility] [place]   Phone: [phone]  [facility]      Phone: [phone]  [facility]    Phone: [phone]

## 2018-07-28 ENCOUNTER — Encounter (HOSPITAL_COMMUNITY): Payer: Self-pay | Admitting: Emergency Medicine

## 2018-07-28 ENCOUNTER — Emergency Department (HOSPITAL_COMMUNITY)
Admission: EM | Admit: 2018-07-28 | Discharge: 2018-07-28 | Disposition: A | Discharge status: Home / Self Care | Payer: Self-pay | Attending: Emergency Medicine | Admitting: Emergency Medicine

## 2018-07-28 DIAGNOSIS — N898 Other specified noninflammatory disorders of vagina: Secondary | ICD-10-CM | POA: Insufficient documentation

## 2018-07-28 DIAGNOSIS — F17210 Nicotine dependence, cigarettes, uncomplicated: Secondary | ICD-10-CM | POA: Insufficient documentation

## 2018-07-28 NOTE — ED Provider Notes (Signed)
[facility]EMERGENCY [facility]PT Provider Note   CSN: [id number] Arrival date & time: 07/28/18  1309     History   Chief Complaint Chief Complaint  Patient presents with  . Possible Pregnancy    HPI Amanda Russell is a 22 y.o. female.  HPI   Amanda Russell is a 22yo female with a history of ADHD, oppositional defiant disorder and depression who presents to the emergency department asking for STD results.  Patient states that she was seen at OB/GYN outpatient office yesterday for vaginal discharge with odor x3 weeks.  She had a wet prep completed which showed BV and many white cells.  She was subsequently discharged with Flagyl as well as fluconazole.  HIV, syphilis, chlamydia and gonorrhea testing was also completed but she did not get these results.  She is asking if she tested positive for any of these.  States that she has not started her Flagyl yet for BV and continues to have malodorous discharge.  She does endorse douching.  She denies abdominal pain, fever, vomiting, dysuria, urinary frequency.  Past Medical History:  Diagnosis Date  . ADHD (attention deficit hyperactivity disorder), combined type 07/14/2012  . Chlamydia   . Suicidal ideation     Patient Active Problem List   Diagnosis Date Noted  . 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 Preterm premature rupture of membranes (PPROM) with unknown onset of labor 07/13/2014  . NVD (normal vaginal delivery) 07/13/2014  . Suicidal ideation 07/14/2012  . Depression 07/14/2012  . ADHD (attention deficit hyperactivity disorder), combined type 07/14/2012  . Oppositional defiant disorder 07/14/2012  . Parent-child relational problem 07/14/2012    Past Surgical History:  Procedure Laterality Date  . NO PAST SURGERIES       OB History    Gravida  2   Para  2   Term  1   Preterm  1   AB      Living  2     SAB      TAB      Ectopic      Multiple      Live  Births  2            Home Medications    Prior to Admission medications   Medication Sig Start Date End Date Taking? Authorizing Provider  fluconazole (DIFLUCAN) 150 MG tablet Take one tablet by mouth as a single dose. May repeat in 3 days if needed. 07/27/18   Leftwich-Kirby, Wilmer Floor, CNM  metroNIDAZOLE (FLAGYL) 500 MG tablet Take 1 tablet (500 mg total) by mouth 2 (two) times daily for 7 days. 07/27/18 08/03/18  Leftwich-Kirby, Wilmer Floor, CNM    Family History Family History  Problem Relation Age of Onset  . Hypertension Other   . Cancer Neg Hx   . Diabetes Neg Hx     Social History Social History   Tobacco Use  . Smoking status: Current Every Day Smoker    Packs/day: 0.50    Years: 0.80    Pack years: 0.40    Types: Cigarettes    Last attempt to quit: 01/07/2011    Years since quitting: 7.5  . Smokeless tobacco: Never Used  Substance Use Topics  . Alcohol use: Yes    Comment: socially  . Drug use: Not Currently    Frequency: 7.0 times per week    Types: Marijuana    Comment: denies     Allergies   Pollen extract  Review of Systems Review of Systems  Constitutional: Negative for chills and fever.  Gastrointestinal: Negative for abdominal pain and vomiting.  Genitourinary: Positive for vaginal discharge. Negative for difficulty urinating, dysuria, flank pain, frequency and vaginal bleeding.     Physical Exam Updated Vital Signs BP 114/70 (BP Location: Right Arm)   Pulse 67   Temp 98.5 F (36.9 C) (Oral)   Resp 18   LMP 07/04/2018   SpO2 100%   Physical Exam  Constitutional: She appears well-developed and well-nourished. No distress.  HENT:  Head: Normocephalic and atraumatic.  Eyes: Right eye exhibits no discharge. Left eye exhibits no discharge.  Pulmonary/Chest: Effort normal. No respiratory distress.  Abdominal: Soft. Bowel sounds are normal. There is no tenderness.  Neurological: She is alert. Coordination normal.  Skin: Skin is warm. She is  not diaphoretic.  Psychiatric: She has a normal mood and affect. Her behavior is normal.  Patient is tearful after I tell her that she tested negative for HIV and syphilis.  States that this is good news.  Her voice is appropriate speed and volume.  No apparent delusions or hallucinations.  She denies depression, SI/HI.    Nursing note and vitals reviewed.   ED Treatments / Results  Labs (all labs ordered are listed, but only abnormal results are displayed) Labs Reviewed - No data to display  EKG None  Radiology No results found.  Procedures Procedures (including critical care time)  Medications Ordered in ED Medications - No data to display   Initial Impression / Assessment and Plan / ED Course  I have reviewed the triage vital signs and the nursing notes.  Pertinent labs & imaging results that were available during my care of the patient were reviewed by me and considered in my medical decision making (see chart for details).    Patient presents the emergency department for results of her STD testing after swabs were performed at [facility] outpatient health clinic yesterday.  According to chart review she tested negative for HIV and syphilis, GC/chlamydia is pending.  I discussed results with patient who is tearful.  She denies SI/HI.  She denies fevers, chills, abdominal pain, vomiting.  I discussed the importance of starting the prescribed Flagyl.  We had a long discussion about BV and I counseled her to stop douching as this can put her at risk for this infection.  I counseled her not to drink alcohol with this medication.  She denies any other complaints.  Discharge home.  Final Clinical Impressions(s) / ED Diagnoses   Final diagnoses:  Vaginal discharge    ED Discharge Orders    None       Lawrence Marseilles 07/28/18 1642    Benjiman Core, MD 07/29/18 [phone]

## 2018-07-28 NOTE — ED Triage Notes (Signed)
Patient here from home with complaints of possible pregnancy. Reports "I know Im pregnant because I can feel a heart beat in my stomach". Seen at [facility] yesterday. Negative pregnancy test.

## 2018-07-28 NOTE — Discharge Instructions (Addendum)
Take your prescribed Flagyl.  Remember not to drink alcohol while taking this medicine as it can make you very sick.

## 2018-07-29 LAB — GC/CHLAMYDIA PROBE AMP (~~LOC~~) NOT AT ARMC
CHLAMYDIA, DNA PROBE: NEGATIVE
NEISSERIA GONORRHEA: NEGATIVE

## 2018-08-01 ENCOUNTER — Emergency Department (HOSPITAL_COMMUNITY)
Admission: EM | Admit: 2018-08-01 | Discharge: 2018-08-02 | Disposition: A | Discharge status: Other Acute Inpatient Hospital | Payer: Self-pay | Attending: Emergency Medicine | Admitting: Emergency Medicine

## 2018-08-01 DIAGNOSIS — F913 Oppositional defiant disorder: Secondary | ICD-10-CM | POA: Insufficient documentation

## 2018-08-01 DIAGNOSIS — F329 Major depressive disorder, single episode, unspecified: Secondary | ICD-10-CM | POA: Insufficient documentation

## 2018-08-01 DIAGNOSIS — F12150 Cannabis abuse with psychotic disorder with delusions: Secondary | ICD-10-CM | POA: Diagnosis present

## 2018-08-01 DIAGNOSIS — R45851 Suicidal ideations: Secondary | ICD-10-CM | POA: Insufficient documentation

## 2018-08-01 DIAGNOSIS — F17210 Nicotine dependence, cigarettes, uncomplicated: Secondary | ICD-10-CM | POA: Insufficient documentation

## 2018-08-01 DIAGNOSIS — F902 Attention-deficit hyperactivity disorder, combined type: Secondary | ICD-10-CM | POA: Insufficient documentation

## 2018-08-01 LAB — COMPREHENSIVE METABOLIC PANEL
ALBUMIN: 4.6 g/dL (ref 3.5–5.0)
ALT: 16 U/L (ref 0–44)
ANION GAP: 10 (ref 5–15)
AST: 19 U/L (ref 15–41)
Alkaline Phosphatase: 64 U/L (ref 38–126)
BUN: 7 mg/dL (ref 6–20)
CHLORIDE: 102 mmol/L (ref 98–111)
CO2: 25 mmol/L (ref 22–32)
Calcium: 9.1 mg/dL (ref 8.9–10.3)
Creatinine, Ser: 0.84 mg/dL (ref 0.44–1.00)
GFR calc Af Amer: >60 mL/min (ref >60)
Glucose, Bld: 143 mg/dL — ABNORMAL HIGH (ref 70–99)
POTASSIUM: 2.9 mmol/L — AB (ref 3.5–5.1)
SODIUM: 137 mmol/L (ref 135–145)
Total Bilirubin: 1.9 mg/dL — ABNORMAL HIGH (ref 0.3–1.2)
Total Protein: 8 g/dL (ref 6.5–8.1)

## 2018-08-01 LAB — CBC WITH DIFFERENTIAL/PLATELET
ABS IMMATURE GRANULOCYTES: 0.02 10*3/uL (ref 0.00–0.07)
Basophils Absolute: 0 10*3/uL (ref 0.0–0.1)
Basophils Relative: 0 %
EOS PCT: 0 %
Eosinophils Absolute: 0 10*3/uL (ref 0.0–0.5)
HEMATOCRIT: 39 % (ref 36.0–46.0)
Hemoglobin: 13.3 g/dL (ref 12.0–15.0)
Immature Granulocytes: 0 %
LYMPHS ABS: 1.3 10*3/uL (ref 0.7–4.0)
Lymphocytes Relative: 14 %
MCH: 29 pg (ref 26.0–34.0)
MCHC: 34.1 g/dL (ref 30.0–36.0)
MCV: 85.2 fL (ref 80.0–100.0)
MONO ABS: 0.5 10*3/uL (ref 0.1–1.0)
Monocytes Relative: 6 %
NEUTROS ABS: 7.3 10*3/uL (ref 1.7–7.7)
Neutrophils Relative %: 80 %
Platelets: 205 10*3/uL (ref 150–400)
RBC: 4.58 MIL/uL (ref 3.87–5.11)
RDW: 12.3 % (ref 11.5–15.5)
WBC: 9.2 10*3/uL (ref 4.0–10.5)
nRBC: 0 % (ref 0.0–0.2)

## 2018-08-01 LAB — RAPID URINE DRUG SCREEN, HOSP PERFORMED
Amphetamines: NOT DETECTED
BARBITURATES: NOT DETECTED
Benzodiazepines: NOT DETECTED
COCAINE: NOT DETECTED
Opiates: NOT DETECTED
TETRAHYDROCANNABINOL: POSITIVE — AB

## 2018-08-01 LAB — I-STAT BETA HCG BLOOD, ED (MC, WL, AP ONLY): I-stat hCG, quantitative: <5 m[IU]/mL (ref <5)

## 2018-08-01 LAB — ETHANOL

## 2018-08-01 MED ORDER — ACETAMINOPHEN 325 MG PO TABS
650.0000 mg | ORAL_TABLET | Freq: Once | ORAL | Status: AC
  Administered 2018-08-01: 650 mg via ORAL
  Filled 2018-08-01: qty 2

## 2018-08-01 MED ORDER — ALUM & MAG HYDROXIDE-SIMETH 200-200-20 MG/5ML PO SUSP
30.0000 mL | Freq: Once | ORAL | Status: AC
  Administered 2018-08-01: 30 mL via ORAL
  Filled 2018-08-01: qty 30

## 2018-08-01 MED ORDER — LORAZEPAM 2 MG/ML IJ SOLN
1.0000 mg | Freq: Once | INTRAMUSCULAR | Status: AC
  Administered 2018-08-02: 1 mg via INTRAMUSCULAR
  Filled 2018-08-01: qty 1

## 2018-08-01 MED ORDER — POTASSIUM CHLORIDE CRYS ER 20 MEQ PO TBCR
40.0000 meq | EXTENDED_RELEASE_TABLET | Freq: Once | ORAL | Status: AC
  Administered 2018-08-01: 40 meq via ORAL
  Filled 2018-08-01: qty 2

## 2018-08-01 MED ORDER — ZIPRASIDONE MESYLATE 20 MG IM SOLR
20.0000 mg | Freq: Once | INTRAMUSCULAR | Status: AC
  Administered 2018-08-02: 20 mg via INTRAMUSCULAR
  Filled 2018-08-01: qty 20

## 2018-08-01 MED ORDER — SODIUM CHLORIDE 0.9 % IJ SOLN
INTRAMUSCULAR | Status: AC
  Filled 2018-08-01: qty 10

## 2018-08-01 MED ORDER — STERILE WATER FOR INJECTION IJ SOLN
INTRAMUSCULAR | Status: AC
  Administered 2018-08-02: 10 mL
  Filled 2018-08-01: qty 10

## 2018-08-01 MED ORDER — DIPHENHYDRAMINE HCL 50 MG/ML IJ SOLN
50.0000 mg | Freq: Once | INTRAMUSCULAR | Status: AC
  Administered 2018-08-02: 50 mg via INTRAMUSCULAR
  Filled 2018-08-01: qty 1

## 2018-08-01 NOTE — ED Provider Notes (Signed)
[facility]-EMERGENCY DEPT Provider Note   CSN: [id number] Arrival date & time: 08/01/18  1016     History   Chief Complaint Chief Complaint  Patient presents with  . Hallucinations  . Suicidal    HPI Amanda Russell is a 22 y.o. female.  22 year old female with history of ADHD, depression presents with increasing agitation as well as hyperactivity.  According to mom, patient has been very paranoid and appears to be responding to internal stimuli of the last several days.  No reported use of alcohol or illicit drugs.  Mom states that patient has not slept in over 4 days.  Mom was trying to bring patient to the hospital when she attempted to jump out of the car.  No injury associated with this.  They were in a parking lot at the surgical center and patient was very boisterous and loud and please were called and patient presents at this time.     Past Medical History:  Diagnosis Date  . ADHD (attention deficit hyperactivity disorder), combined type 07/14/2012  . Chlamydia   . Suicidal ideation     Patient Active Problem List   Diagnosis Date Noted  . 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 Preterm premature rupture of membranes (PPROM) with unknown onset of labor 07/13/2014  . NVD (normal vaginal delivery) 07/13/2014  . Suicidal ideation 07/14/2012  . Depression 07/14/2012  . ADHD (attention deficit hyperactivity disorder), combined type 07/14/2012  . Oppositional defiant disorder 07/14/2012  . Parent-child relational problem 07/14/2012    Past Surgical History:  Procedure Laterality Date  . NO PAST SURGERIES       OB History    Gravida  2   Para  2   Term  1   Preterm  1   AB      Living  2     SAB      TAB      Ectopic      Multiple      Live Births  2            Home Medications    Prior to Admission medications   Medication Sig Start Date End Date Taking? Authorizing Provider    fluconazole (DIFLUCAN) 150 MG tablet Take one tablet by mouth as a single dose. May repeat in 3 days if needed. 07/27/18   Leftwich-Kirby, Wilmer Floor, CNM  metroNIDAZOLE (FLAGYL) 500 MG tablet Take 1 tablet (500 mg total) by mouth 2 (two) times daily for 7 days. 07/27/18 08/03/18  Leftwich-Kirby, Wilmer Floor, CNM    Family History Family History  Problem Relation Age of Onset  . Hypertension Other   . Cancer Neg Hx   . Diabetes Neg Hx     Social History Social History   Tobacco Use  . Smoking status: Current Every Day Smoker    Packs/day: 0.50    Years: 0.80    Pack years: 0.40    Types: Cigarettes    Last attempt to quit: 01/07/2011    Years since quitting: 7.5  . Smokeless tobacco: Never Used  Substance Use Topics  . Alcohol use: Yes    Comment: socially  . Drug use: Not Currently    Frequency: 7.0 times per week    Types: Marijuana    Comment: denies     Allergies   Pollen extract   Review of Systems Review of Systems  All other systems reviewed and are negative.  Physical Exam Updated Vital Signs BP (!) 115/93 (BP Location: Right Arm)   Pulse 70   Temp 97.8 F (36.6 C) (Oral)   Resp 18   LMP 07/04/2018   SpO2 100%   Physical Exam  Constitutional: She is oriented to person, place, and time. She appears well-developed and well-nourished.  Non-toxic appearance. No distress.  HENT:  Head: Normocephalic and atraumatic.  Eyes: Pupils are equal, round, and reactive to light. Conjunctivae, EOM and lids are normal.  Neck: Normal range of motion. Neck supple. No tracheal deviation present. No thyroid mass present.  Cardiovascular: Normal rate, regular rhythm and normal heart sounds. Exam reveals no gallop.  No murmur heard. Pulmonary/Chest: Effort normal and breath sounds normal. No stridor. No respiratory distress. She has no decreased breath sounds. She has no wheezes. She has no rhonchi. She has no rales.  Abdominal: Soft. Normal appearance and bowel sounds are  normal. She exhibits no distension. There is no tenderness. There is no rebound and no CVA tenderness.  Musculoskeletal: Normal range of motion. She exhibits no edema or tenderness.  Neurological: She is alert and oriented to person, place, and time. She has normal strength. No cranial nerve deficit or sensory deficit. GCS eye subscore is 4. GCS verbal subscore is 5. GCS motor subscore is 6.  Skin: Skin is warm and dry. No abrasion and no rash noted.  Psychiatric: Her affect is labile and inappropriate. Her speech is rapid and/or pressured. She is aggressive and actively hallucinating. Thought content is delusional. She expresses impulsivity. She expresses no suicidal plans and no homicidal plans. She is inattentive.  Nursing note and vitals reviewed.    ED Treatments / Results  Labs (all labs ordered are listed, but only abnormal results are displayed) Labs Reviewed  CBC WITH DIFFERENTIAL/PLATELET  COMPREHENSIVE METABOLIC PANEL  RAPID URINE DRUG SCREEN, HOSP PERFORMED  ETHANOL  I-STAT BETA HCG BLOOD, ED ([facility], [facility], [facility] ONLY)    EKG None  Radiology No results found.  Procedures Procedures (including critical care time)  Medications Ordered in ED Medications - No data to display   Initial Impression / Assessment and Plan / ED Course  I have reviewed the triage vital signs and the nursing notes.  Pertinent labs & imaging results that were available during my care of the patient were reviewed by me and considered in my medical decision making (see chart for details).     Patient now medically clear for psychiatric disposition.  Final Clinical Impressions(s) / ED Diagnoses   Final diagnoses:  None    ED Discharge Orders    None       Lorre Nick, MD 08/01/18 1151

## 2018-08-01 NOTE — ED Notes (Addendum)
Pt up at the phone, tearful at times, asking why the police officers keep staring at her, "I  Don't want to go to jail..."  Support given, pt reasurred that the police were not here to take her to jail.

## 2018-08-01 NOTE — BH Assessment (Addendum)
Assessment Note  Amanda Russell is an 22 y.o. female that presents this date voluntary with S/I. Patient is observed to be very tangential during assessment and difficult to redirect. Patient is observed to be liable and tearful at times as this writers conducts assessment. Patient renders limited history and after several attempts admits to S/I stating "yes, yes is that what you want to hear." Patient denies any plan or intent. Patient is very agitated and speaks in a loud pressured voice. Patient admits to AVH although will not elaborate on content. Patient states "they make me here and see things" although is unable to verbalize symptoms. Patient is observed to be delusional and continues to inform this Clinical research associate that "the plane is waiting to take her to her rap studio in [place]." Per note review patient attempted to jump out of her mother's car during transport but denied to this Clinical research associate at the time of assessment. Patient states "that is all lies." Patient denies any history of SA use although per notes tested positive for THC this date. Per note review patient has a history of substance induced psychosis and was last seen at [facility] ED with IVC on 01/06/18 presenting with similar symptoms at that time. Patient renders limited history and collateral information is provided by mother who is present. Patient's mother reports that patient has received services in the past from [place] although is uncertain when patient last saw that provider or what services were rendered. Patient per notes, has a history of ADHD and substance induced psychosis.  According to mother this date, patient has been very paranoid and appears to be responding to internal stimuli of the last several days. Patient's mother reports patient has not slept in three days. Mother was trying to bring patient to the hospital when she attempted to jump out of the car per notes. Case was staffed with Beatrix Shipper FNP who recommended a inpatient  admission to assist with stabilization.   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 ADHD (attention deficit hyperactivity disorder), combined type 07/14/2012  . Chlamydia   . Suicidal ideation     Past Surgical History:  Procedure Laterality Date  . NO PAST SURGERIES      Family History:  Family History  Problem Relation Age of Onset  . Hypertension Other   . Cancer Neg Hx   . Diabetes Neg Hx     Social History:  reports that she has been smoking cigarettes. She has a 0.40 pack-year smoking history. She has never used smokeless tobacco. She reports that she drinks alcohol. She reports that she has current or past drug history. Drug: Marijuana. Frequency: 7.00 times per week.  Additional Social History:  Alcohol / Drug Use Pain Medications: See MAR Prescriptions: See MAR Over the Counter: See MAR History of alcohol / drug use?: Yes Longest period of sobriety (when/how long): Unknown Negative Consequences of Use: (Denies this date) Withdrawal Symptoms: (Denies) Substance #1 Name of Substance 1: Cannabis per hx 1 - Age of First Use: UTA 1 - Amount (size/oz): UTA 1 - Frequency: UTA 1 - Duration: UTA 1 - Last Use / Amount: UTA  CIWA: CIWA-Ar BP: (!) 115/93 Pulse Rate: 70 COWS:    Allergies:  Allergies  Allergen Reactions  . Pollen Extract Itching    Home Medications:  (Not in a hospital admission)  OB/GYN Status:  Patient's last menstrual period was 07/04/2018.  General Assessment Data [facility] of Assessment: [facility] ED TTS Assessment: In system Is this a Tele or Face-to-Face Assessment?: Face-to-Face Is this an Initial Assessment or a Re-assessment for this encounter?: Initial Assessment Patient Accompanied by:: Parent Language Other than English: No Living Arrangements: (Alone) What gender do you identify as?: Female Marital status: Single Maiden name: Bannister Pregnancy  Status: Unknown Living Arrangements: Alone Can pt return to current living arrangement?: Yes Admission Status: Voluntary Is patient capable of signing voluntary admission?: Yes Referral Source: Self/Family/Friend Insurance type: None  Medical Screening Exam [facility] Walk-in ONLY) Medical Exam completed: Yes  Crisis Care Plan Living Arrangements: Alone Legal Guardian: (NA) Name of Psychiatrist: None Name of Therapist: None  Education Status Is patient currently in school?: No Is the patient employed, unemployed or receiving disability?: Unemployed  Risk to self with the past 6 months Suicidal Ideation: Yes-Currently Present Has patient been a risk to self within the past 6 months prior to admission? : No Suicidal Intent: No Has patient had any suicidal intent within the past 6 months prior to admission? : No Is patient at risk for suicide?: Yes Suicidal Plan?: No Has patient had any suicidal plan within the past 6 months prior to admission? : No Access to Means: No What has been your use of drugs/alcohol within the last 12 months?: UDS pending Hx of Cannabis Previous Attempts/Gestures: Yes(Per hx ) How many times?: 1(Per notes) Other Self Harm Risks: (Unknown) Triggers for Past Attempts: Unknown Intentional Self Injurious Behavior: None Family Suicide History: No Recent stressful life event(s): (Unknown) Persecutory voices/beliefs?: No Depression: (UTA) Depression Symptoms: (UTA) Substance abuse history and/or treatment for substance abuse?: No Suicide prevention information given to non-admitted patients: Not applicable  Risk to Others within the past 6 months Homicidal Ideation: No Does patient have any lifetime risk of violence toward others beyond the six months prior to admission? : No Thoughts of Harm to Others: No Current Homicidal Intent: No Current Homicidal Plan: No Access to Homicidal Means: No Identified Victim: NA History of harm to others?: No Assessment  of Violence: None Noted Violent Behavior Description: NA Does patient have access to weapons?: No Criminal Charges Pending?: No Does patient have a court date: No Is patient on probation?: Unknown  Psychosis Hallucinations: Auditory, Visual Delusions: Grandiose  Mental Status Report Appearance/Hygiene: In scrubs Eye Contact: Fair Motor Activity: Agitation Speech: Tangential Level of Consciousness: Irritable Mood: Anxious Affect: Apprehensive Anxiety Level: Severe Thought Processes: Tangential Judgement: Impaired Orientation: Unable to assess Obsessive Compulsive Thoughts/Behaviors: Unable to Assess  Cognitive Functioning Concentration: Decreased Memory: Unable to Assess Is patient IDD: No Insight: Unable to Assess Impulse Control: Unable to Assess Appetite: (UTA) Have you had any weight changes? : (UTA) Sleep: Decreased(Per mother who reports pt not slept in 3 days) Total Hours of Sleep: (Per mother pt states pt has not slept in 3 days) Vegetative Symptoms: Unable to Assess  ADLScreening [facility] Assessment Services) Patient's cognitive ability adequate to safely complete daily activities?: Yes Patient able to express need for assistance with ADLs?: Yes Independently performs ADLs?: Yes (appropriate for developmental age)  Prior Inpatient Therapy Prior Inpatient Therapy: Yes Prior Therapy Dates: 2019 Prior Therapy Facilty/Provider(s): [facility] Reason for Treatment: MH issues  Prior Outpatient Therapy Prior Outpatient Therapy: Yes(Per hx.) Prior Therapy Dates: Per hx dates unknown Prior Therapy Facilty/Provider(s): [facility] Reason for Treatment: Counseling (Per hx) Does patient have an ACCT team?: No Does patient have Intensive In-House Services?  : No Does patient have [facility] services? : Yes(Per hx)  Does patient have P4CC services?: No  ADL Screening (condition at time of admission) Patient's cognitive ability adequate to safely complete daily activities?: Yes Is  the patient deaf or have difficulty hearing?: No Does the patient have difficulty seeing, even when wearing glasses/contacts?: No Does the patient have difficulty concentrating, remembering, or making decisions?: Yes Patient able to express need for assistance with ADLs?: Yes Does the patient have difficulty dressing or bathing?: No Independently performs ADLs?: Yes (appropriate for developmental age) Does the patient have difficulty walking or climbing stairs?: No Weakness of Legs: None Weakness of Arms/Hands: None  Home Assistive Devices/Equipment Home Assistive Devices/Equipment: None  Therapy Consults (therapy consults require a physician order) PT Evaluation Needed: No OT Evalulation Needed: No SLP Evaluation Needed: No Abuse/Neglect Assessment (Assessment to be complete while patient is alone) Physical Abuse: Denies Verbal Abuse: Denies Sexual Abuse: Denies Exploitation of patient/patient's resources: Denies Self-Neglect: Denies Values / Beliefs Cultural Requests During Hospitalization: None Spiritual Requests During Hospitalization: None Consults Spiritual Care Consult Needed: No Social Work Consult Needed: No Merchant navy officer (For Healthcare) Does Patient Have a Medical Advance Directive?: No Would patient like information on creating a medical advance directive?: No - Patient declined          Disposition: Case was staffed with Beatrix Shipper FNP who recommended a inpatient admission to assist with stabilization.  Disposition Initial Assessment Completed for this Encounter: Yes Disposition of Patient: Admit Type of inpatient treatment program: Adult Patient refused recommended treatment: No Mode of transportation if patient is discharged?: (Unk)  On Site Evaluation by:   Reviewed with Physician:    Alfredia Ferguson 08/01/2018 12:26 PM

## 2018-08-01 NOTE — ED Notes (Signed)
Bed: ZO10 Expected date:  Expected time:  Means of arrival:  Comments: [employer]- voluntary psych

## 2018-08-01 NOTE — BH Assessment (Signed)
[facility] Assessment Progress Note Case was staffed with Norman DO, Parks FNP who recommended a inpatient admission to assist with stabilization   

## 2018-08-01 NOTE — ED Notes (Signed)
Pt dressed in purple scrubs.  Earrings removed, placed in cup and given to mother.

## 2018-08-01 NOTE — ED Notes (Addendum)
Amanda Cones NP over to talk w/ pt.  Pt  Distracted, appears to be responding to  Internal stimulus while trying to talk with the NP. Pt stated that she wanted to talk to the people who were talking--pt unable to identify who the people were, no other people present.

## 2018-08-01 NOTE — BH Assessment (Signed)
[facility] Ed Assessment Progress Note 08/01/18: This Clinical research associate spoke with Charm Barges MD who will initiate IVC this date.

## 2018-08-01 NOTE — Progress Notes (Addendum)
Patient ID: Amanda Russell, female   DOB: [birth year], 22 y.o.   MRN: [id number]  Pt has been accepted to [facility], pending IVC. EDP Charm Barges will initiate the IVC. Pt is disorganized, paranoid, and responding to internal stimuli. Pt will need to transfer via law enforcement after IVC is complete. Turner Daniels has agreed to hold the bed until 08-02-2018.   Laveda Abbe, NP-C 08-01-2018    [phone]  Patient's chart reviewed and case discussed with the physician extender and developed treatment plan. Reviewed the information documented and agree with the treatment plan.  Juanetta Beets, DO 08/04/18 4:58 PM

## 2018-08-01 NOTE — ED Notes (Addendum)
Up at the phone, pt reports that she is still feeling weak, tearful, but less so.  Pt wanting to go home, attempted to discuss with pt the importance of staying, getting some sleep, and talking with the MD in the morning.  PT verbalized understanding, but did not seem to comprehend and appeared to be responding to internal stimulus.

## 2018-08-01 NOTE — ED Notes (Addendum)
Pt alert/oriented, denies si/hi/avh at this time and states she "is not delusional".  Pt crying uncontrolably.  Pt reports that she lives alone, does not have food at the house, has not been sleeping or eating, has had large weight loss recently, and that the people she is around thinks she has HIV.  Pt reports that this started along with leg pain after she had sex.  Support given, pt reports that she has eatten since arriving here, but does not want our food.  Difficult to get information from patient, pt encouraged to rest.  Pt also crying, talking about people thinking she was crazy and getting a shot of thorazine.

## 2018-08-01 NOTE — ED Triage Notes (Signed)
Pt brought in by [employer] with mother present.  [employer] report they were called by mother.  Mother was en route to bring pt here when she started yelling and trying to jump out of the car. Pt here is tearful, short attention span, unable to answer to what her name is, only focused on mom staying in room, saying they'll believe her if she's in there.  When asked by this RN why she was brought here, she was able to vocalize she was at [facility], kept asking "Can you keep me safe?" Mom states that pt was hallucinating, saying someone was after her, having thoughts of hurting herself with a kitchen knife.

## 2018-08-01 NOTE — ED Notes (Signed)
Up to the bathroom 

## 2018-08-01 NOTE — ED Notes (Signed)
meds given w/ orange juice

## 2018-08-01 NOTE — ED Notes (Signed)
ED Provider and TTS at bedside.

## 2018-08-01 NOTE — ED Notes (Signed)
Pt ambulatory from TCU to room 43

## 2018-08-01 NOTE — ED Notes (Signed)
Pt c/o lower L quad pain.  Pt describes pain as sharp.  Pt has two previous does of K+ on empty stomach. Pt given Maalox and tylenol 650 mg po.

## 2018-08-01 NOTE — BH Assessment (Signed)
[facility] Assessment Progress Note  Per Juanetta Beets, DO, this pt requires psychiatric hospitalization at this time.  At 16:28 Thayer Ohm calls from [facility].  Pt has been accepted to their facility by Dr Loann Quill to the [facility] unit, Rm 246-2.  Elta Guadeloupe, FNP, concurs with this disposition.  Please note that pt is under voluntary status, and is unlikely to consent to admission.  In Laurie's opinion, pt meets criteria for IVC; please staff this with EDP and ask that pt be considered for IVC.  Pt's nurse, Wille Celeste, has been notified, and agrees to call report to [phone].  Also, IVC documents will need to be faxed to [phone].  If placed under IVC, pt is to be transported via [facility].  [facility] stipulates that pt must arrive either before 23:00 tonight, or after 06:00 tomorrow.     Doylene Canning, [place] [facility] Coordinator [phone]

## 2018-08-02 DIAGNOSIS — F312 Bipolar disorder, current episode manic severe with psychotic features: Secondary | ICD-10-CM | POA: Insufficient documentation

## 2018-08-02 NOTE — ED Notes (Signed)
Patient is sleeping due to receiving medicine

## 2018-08-02 NOTE — ED Notes (Signed)
Sheriff called to say it would be this afternoon before patient would be transported.  He would call ahead to let us know when.

## 2018-08-02 NOTE — ED Notes (Signed)
Patient to be transported at approximately 1pm.

## 2018-08-02 NOTE — Progress Notes (Signed)
Pt in hall tearful and hearing voices of non-command type.  Pt sts she cannot sleep.  Pt back in room crying loudly asking if someone will hug her.  Pt back in hallway complaining that she cannot sleep, that she hears gunshots and someone talking to her.  Pt pulls alarm in room and is talking loudly. Order received for Benadryl 50 mg IM, Ativan 1 mg IM, and Geodon 20 mg IM.  IM given with security present.  Pt sts she wont talk crazy now that she has had medication. Pt is in bed sleeping.

## 2018-08-03 DIAGNOSIS — F17200 Nicotine dependence, unspecified, uncomplicated: Secondary | ICD-10-CM | POA: Insufficient documentation

## 2018-08-03 DIAGNOSIS — F1210 Cannabis abuse, uncomplicated: Secondary | ICD-10-CM | POA: Insufficient documentation

## 2018-12-30 ENCOUNTER — Other Ambulatory Visit: Payer: Self-pay

## 2018-12-30 ENCOUNTER — Encounter (HOSPITAL_COMMUNITY): Payer: Self-pay | Admitting: Emergency Medicine

## 2018-12-30 ENCOUNTER — Ambulatory Visit (HOSPITAL_COMMUNITY)
Admission: EM | Admit: 2018-12-30 | Discharge: 2018-12-30 | Disposition: A | Discharge status: Home / Self Care | Payer: Self-pay | Attending: Family Medicine | Admitting: Family Medicine

## 2018-12-30 DIAGNOSIS — N898 Other specified noninflammatory disorders of vagina: Secondary | ICD-10-CM | POA: Insufficient documentation

## 2018-12-30 MED ORDER — FLUCONAZOLE 150 MG PO TABS: 150.0000 mg | ORAL_TABLET | Freq: Once | ORAL | 0 refills | Status: AC

## 2018-12-30 NOTE — ED Provider Notes (Signed)
[facility]    CSN: [id number] Arrival date & time: 12/30/18  1021     History   Chief Complaint Chief Complaint  Patient presents with  . Vaginal Discharge    HPI Amanda Russell is a 23 y.o. female no contributing past medical history presenting today for evaluation of vaginal discharge.  Patient states that since Saturday over the past 4 days she has had discharge and associated itching.  Feel similar to when she has had yeast in the past.  Also notes that she has had history of BV, but this does not feel like BV.  Denies pelvic pain, fever, nausea vomiting or abdominal pain.  States last menstrual cycle was earlier in March though she cannot recall the exact date.  Is not on any form of birth control.  Denies urinary symptoms of burning, increased frequency or hematuria.  Denies specific concerns for STDs.  HPI  Past Medical History:  Diagnosis Date  . ADHD (attention deficit hyperactivity disorder), combined type 07/14/2012  . Chlamydia   . Suicidal ideation     Patient Active Problem List   Diagnosis Date Noted  . 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 Preterm premature rupture of membranes (PPROM) with unknown onset of labor 07/13/2014  . NVD (normal vaginal delivery) 07/13/2014  . Suicidal ideation 07/14/2012  . Depression 07/14/2012  . ADHD (attention deficit hyperactivity disorder), combined type 07/14/2012  . Oppositional defiant disorder 07/14/2012  . Parent-child relational problem 07/14/2012    Past Surgical History:  Procedure Laterality Date  . NO PAST SURGERIES      OB History    Gravida  2   Para  2   Term  1   Preterm  1   AB      Living  2     SAB      TAB      Ectopic      Multiple      Live Births  2            Home Medications    Prior to Admission medications   Medication Sig Start Date End Date Taking? Authorizing Provider  fluconazole (DIFLUCAN) 150 MG tablet Take 1  tablet (150 mg total) by mouth once for 1 dose. 12/30/18 12/30/18  Wieters, Hallie C, PA-C  Prenatal Vit-Fe Fumarate-FA (PRENATAL PO) Take 1 tablet by mouth daily.    [provider]    Family History Family History  Problem Relation Age of Onset  . Hypertension Other   . Cancer Neg Hx   . Diabetes Neg Hx     Social History Social History   Tobacco Use  . Smoking status: Current Every Day Smoker    Packs/day: 0.50    Years: 0.80    Pack years: 0.40    Types: Cigarettes    Last attempt to quit: 01/07/2011    Years since quitting: 7.9  . Smokeless tobacco: Never Used  Substance Use Topics  . Alcohol use: Yes    Comment: socially  . Drug use: Not Currently    Frequency: 7.0 times per week    Types: Marijuana    Comment: denies     Allergies   Pollen extract   Review of Systems Review of Systems  Constitutional: Negative for fever.  Respiratory: Negative for shortness of breath.   Cardiovascular: Negative for chest pain.  Gastrointestinal: Negative for abdominal pain, diarrhea, nausea and vomiting.  Genitourinary: Positive for vaginal  discharge. Negative for dysuria, flank pain, genital sores, hematuria, menstrual problem, vaginal bleeding and vaginal pain.  Musculoskeletal: Negative for back pain.  Skin: Negative for rash.  Neurological: Negative for dizziness, light-headedness and headaches.     Physical Exam Triage Vital Signs ED Triage Vitals [12/30/18 1038]  Enc Vitals Group     BP 98/61     Pulse Rate 87     Resp 16     Temp 98.1 F (36.7 C)     Temp src      SpO2 98 %     Weight      Height      Head Circumference      Peak Flow      Pain Score 0     Pain Loc      Pain Edu?      Excl. in GC?    No data found.  Updated Vital Signs BP 98/61   Pulse 87   Temp 98.1 F (36.7 C)   Resp 16   LMP 12/02/2018   SpO2 98%   Visual Acuity Right Eye Distance:   Left Eye Distance:   Bilateral Distance:    Right Eye Near:   Left Eye  Near:    Bilateral Near:     Physical Exam Vitals signs and nursing note reviewed.  Constitutional:      General: She is not in acute distress.    Appearance: She is well-developed.  HENT:     Head: Normocephalic and atraumatic.  Eyes:     Conjunctiva/sclera: Conjunctivae normal.  Neck:     Musculoskeletal: Neck supple.  Cardiovascular:     Rate and Rhythm: Normal rate and regular rhythm.     Heart sounds: No murmur.  Pulmonary:     Effort: Pulmonary effort is normal. No respiratory distress.     Breath sounds: Normal breath sounds.  Abdominal:     Palpations: Abdomen is soft.     Tenderness: There is no abdominal tenderness.     Comments: Soft, nondistended, nontender to light deep palpation throughout abdomen  Genitourinary:    Comments: Deferred Skin:    General: Skin is warm and dry.  Neurological:     Mental Status: She is alert.      UC Treatments / Results  Labs (all labs ordered are listed, but only abnormal results are displayed) Labs Reviewed  CERVICOVAGINAL ANCILLARY ONLY    EKG None  Radiology No results found.  Procedures Procedures (including critical care time)  Medications Ordered in UC Medications - No data to display  Initial Impression / Assessment and Plan / UC Course  I have reviewed the triage vital signs and the nursing notes.  Pertinent labs & imaging results that were available during my care of the patient were reviewed by me and considered in my medical decision making (see chart for details).     Will empirically treat for yeast with Diflucan today given patient's reported symptoms.  Swab obtained to check for other causes of discharge.  Will call patient with results and provide further treatment if needed.Discussed strict return precautions. Patient verbalized understanding and is agreeable with plan.  Final Clinical Impressions(s) / UC Diagnoses   Final diagnoses:  Vaginal discharge     Discharge Instructions      Take 1 tablet of diflucan today, may repeat in a couple days  We will call you if anything is positive from the swab and let you know if you require any further  treatment. Please inform partners of any positive results.   Please return if symptoms not improving with treatment, development of fever, nausea, vomiting, abdominal pain.    ED Prescriptions    Medication Sig Dispense Auth. Provider   fluconazole (DIFLUCAN) 150 MG tablet Take 1 tablet (150 mg total) by mouth once for 1 dose. 2 tablet Wieters, Hallie C, PA-C     Controlled Substance Prescriptions [place] Controlled Substance Registry consulted? Not Applicable   Lew Dawes, [place] 12/30/18 1111

## 2018-12-30 NOTE — ED Triage Notes (Signed)
Pt c/o vaginal discharge since Saturday, states it feels like a yeast infection

## 2018-12-30 NOTE — Discharge Instructions (Signed)
Take 1 tablet of diflucan today, may repeat in a couple days  We will call you if anything is positive from the swab and let you know if you require any further treatment. Please inform partners of any positive results.   Please return if symptoms not improving with treatment, development of fever, nausea, vomiting, abdominal pain.

## 2018-12-31 LAB — CERVICOVAGINAL ANCILLARY ONLY
Bacterial vaginitis: NEGATIVE
CHLAMYDIA, DNA PROBE: NEGATIVE
Candida vaginitis: POSITIVE — AB
NEISSERIA GONORRHEA: NEGATIVE
Trichomonas: NEGATIVE

## 2019-01-01 ENCOUNTER — Telehealth (HOSPITAL_COMMUNITY): Payer: Self-pay | Admitting: Emergency Medicine

## 2019-01-01 MED ORDER — FLUCONAZOLE 150 MG PO TABS: 150.0000 mg | ORAL_TABLET | Freq: Once | ORAL | 0 refills | Status: AC

## 2019-01-01 NOTE — Telephone Encounter (Signed)
Patient called stating she accidentally took both yeast pills on the 31st, still having symptoms, sent one more pill for her to take 3 days after. Follow up if still having symptoms.

## 2019-03-13 ENCOUNTER — Emergency Department (HOSPITAL_COMMUNITY): Payer: Self-pay

## 2019-03-13 ENCOUNTER — Emergency Department (HOSPITAL_COMMUNITY)
Admission: EM | Admit: 2019-03-13 | Discharge: 2019-03-15 | Disposition: A | Discharge status: Home / Self Care | Payer: Self-pay | Attending: Emergency Medicine | Admitting: Emergency Medicine

## 2019-03-13 ENCOUNTER — Other Ambulatory Visit: Payer: Self-pay

## 2019-03-13 ENCOUNTER — Encounter (HOSPITAL_COMMUNITY): Payer: Self-pay | Admitting: *Deleted

## 2019-03-13 DIAGNOSIS — Z03818 Encounter for observation for suspected exposure to other biological agents ruled out: Secondary | ICD-10-CM | POA: Insufficient documentation

## 2019-03-13 DIAGNOSIS — F419 Anxiety disorder, unspecified: Secondary | ICD-10-CM | POA: Insufficient documentation

## 2019-03-13 DIAGNOSIS — F23 Brief psychotic disorder: Secondary | ICD-10-CM | POA: Diagnosis present

## 2019-03-13 DIAGNOSIS — Z79899 Other long term (current) drug therapy: Secondary | ICD-10-CM | POA: Insufficient documentation

## 2019-03-13 DIAGNOSIS — R451 Restlessness and agitation: Secondary | ICD-10-CM | POA: Insufficient documentation

## 2019-03-13 DIAGNOSIS — F29 Unspecified psychosis not due to a substance or known physiological condition: Secondary | ICD-10-CM

## 2019-03-13 DIAGNOSIS — F17210 Nicotine dependence, cigarettes, uncomplicated: Secondary | ICD-10-CM | POA: Insufficient documentation

## 2019-03-13 DIAGNOSIS — Z046 Encounter for general psychiatric examination, requested by authority: Secondary | ICD-10-CM | POA: Insufficient documentation

## 2019-03-13 LAB — MAGNESIUM: Magnesium: 2.4 mg/dL (ref 1.7–2.4)

## 2019-03-13 LAB — I-STAT CHEM 8, ED
BUN: 13 mg/dL (ref 6–20)
BUN: 7 mg/dL (ref 6–20)
Calcium, Ion: 1.08 mmol/L — ABNORMAL LOW (ref 1.15–1.40)
Calcium, Ion: 1.17 mmol/L (ref 1.15–1.40)
Chloride: 103 mmol/L (ref 98–111)
Chloride: 105 mmol/L (ref 98–111)
Creatinine, Ser: 0.8 mg/dL (ref 0.44–1.00)
Creatinine, Ser: 0.7 mg/dL (ref 0.44–1.00)
Glucose, Bld: 119 mg/dL — ABNORMAL HIGH (ref 70–99)
Glucose, Bld: 104 mg/dL — ABNORMAL HIGH (ref 70–99)
HCT: 37 % (ref 36.0–46.0)
HCT: 38 % (ref 36.0–46.0)
Hemoglobin: 12.6 g/dL (ref 12.0–15.0)
Hemoglobin: 12.9 g/dL (ref 12.0–15.0)
Potassium: 2.8 mmol/L — ABNORMAL LOW (ref 3.5–5.1)
Potassium: 3.7 mmol/L (ref 3.5–5.1)
Sodium: 139 mmol/L (ref 135–145)
Sodium: 142 mmol/L (ref 135–145)
TCO2: 26 mmol/L (ref 22–32)
TCO2: 26 mmol/L (ref 22–32)

## 2019-03-13 LAB — RAPID URINE DRUG SCREEN, HOSP PERFORMED
Amphetamines: NOT DETECTED
Barbiturates: NOT DETECTED
Benzodiazepines: NOT DETECTED
Cocaine: NOT DETECTED
Opiates: NOT DETECTED
Tetrahydrocannabinol: POSITIVE — AB

## 2019-03-13 LAB — CBC
HCT: 37.8 % (ref 36.0–46.0)
Hemoglobin: 13.4 g/dL (ref 12.0–15.0)
MCH: 29.5 pg (ref 26.0–34.0)
MCHC: 35.4 g/dL (ref 30.0–36.0)
MCV: 83.1 fL (ref 80.0–100.0)
Platelets: 205 10*3/uL (ref 150–400)
RBC: 4.55 MIL/uL (ref 3.87–5.11)
RDW: 11.9 % (ref 11.5–15.5)
WBC: 9 10*3/uL (ref 4.0–10.5)
nRBC: 0 % (ref 0.0–0.2)

## 2019-03-13 LAB — COMPREHENSIVE METABOLIC PANEL
ALT: 23 U/L (ref 0–44)
AST: 24 U/L (ref 15–41)
Albumin: 5 g/dL (ref 3.5–5.0)
Alkaline Phosphatase: 62 U/L (ref 38–126)
Anion gap: 14 (ref 5–15)
BUN: 15 mg/dL (ref 6–20)
CO2: 22 mmol/L (ref 22–32)
Calcium: 9.7 mg/dL (ref 8.9–10.3)
Chloride: 102 mmol/L (ref 98–111)
Creatinine, Ser: 1.23 mg/dL — ABNORMAL HIGH (ref 0.44–1.00)
GFR calc Af Amer: >60 mL/min (ref >60)
GFR calc non Af Amer: >60 mL/min (ref >60)
Glucose, Bld: 164 mg/dL — ABNORMAL HIGH (ref 70–99)
Potassium: 2.9 mmol/L — ABNORMAL LOW (ref 3.5–5.1)
Sodium: 138 mmol/L (ref 135–145)
Total Bilirubin: 1 mg/dL (ref 0.3–1.2)
Total Protein: 8.3 g/dL — ABNORMAL HIGH (ref 6.5–8.1)

## 2019-03-13 LAB — I-STAT BETA HCG BLOOD, ED (MC, WL, AP ONLY)
I-stat hCG, quantitative: <5 m[IU]/mL (ref <5)
I-stat hCG, quantitative: <5 m[IU]/mL (ref <5)

## 2019-03-13 LAB — ACETAMINOPHEN LEVEL: Acetaminophen (Tylenol), Serum: <10 ug/mL — ABNORMAL LOW (ref 10–30)

## 2019-03-13 LAB — SALICYLATE LEVEL: Salicylate Lvl: <7 mg/dL (ref 2.8–30.0)

## 2019-03-13 LAB — LITHIUM LEVEL: Lithium Lvl: <0.06 mmol/L — ABNORMAL LOW (ref 0.60–1.20)

## 2019-03-13 LAB — ETHANOL: Alcohol, Ethyl (B): <10 mg/dL (ref <10)

## 2019-03-13 MED ORDER — STERILE WATER FOR INJECTION IJ SOLN
INTRAMUSCULAR | Status: AC
  Administered 2019-03-13: 07:00:00
  Filled 2019-03-13: qty 10

## 2019-03-13 MED ORDER — ZIPRASIDONE MESYLATE 20 MG IM SOLR
20.0000 mg | Freq: Once | INTRAMUSCULAR | Status: DC
  Filled 2019-03-13: qty 20

## 2019-03-13 MED ORDER — POTASSIUM CHLORIDE CRYS ER 20 MEQ PO TBCR: 40.0000 meq | EXTENDED_RELEASE_TABLET | Freq: Once | ORAL | Status: DC

## 2019-03-13 MED ORDER — POTASSIUM CHLORIDE CRYS ER 20 MEQ PO TBCR
40.0000 meq | EXTENDED_RELEASE_TABLET | Freq: Once | ORAL | Status: AC
  Administered 2019-03-13: 40 meq via ORAL
  Filled 2019-03-13: qty 2

## 2019-03-13 MED ORDER — SODIUM CHLORIDE 0.9 % IV BOLUS
1000.0000 mL | Freq: Once | INTRAVENOUS | Status: AC
  Administered 2019-03-13: 1000 mL via INTRAVENOUS

## 2019-03-13 MED ORDER — ZIPRASIDONE MESYLATE 20 MG IM SOLR
20.0000 mg | Freq: Once | INTRAMUSCULAR | Status: AC
  Administered 2019-03-13: 20 mg via INTRAMUSCULAR
  Filled 2019-03-13: qty 20

## 2019-03-13 MED ORDER — POTASSIUM CHLORIDE 10 MEQ/100ML IV SOLN
10.0000 meq | Freq: Once | INTRAVENOUS | Status: AC
  Administered 2019-03-13: 12:00:00 10 meq via INTRAVENOUS
  Filled 2019-03-13: qty 100

## 2019-03-13 MED ORDER — STERILE WATER FOR INJECTION IJ SOLN
INTRAMUSCULAR | Status: AC
  Filled 2019-03-13: qty 10

## 2019-03-13 MED ORDER — POTASSIUM CHLORIDE CRYS ER 20 MEQ PO TBCR
20.0000 meq | EXTENDED_RELEASE_TABLET | Freq: Once | ORAL | Status: AC
  Administered 2019-03-13: 20 meq via ORAL
  Filled 2019-03-13: qty 1

## 2019-03-13 MED ORDER — POTASSIUM CHLORIDE 10 MEQ/100ML IV SOLN
10.0000 meq | Freq: Once | INTRAVENOUS | Status: AC
  Administered 2019-03-13: 10 meq via INTRAVENOUS
  Filled 2019-03-13: qty 100

## 2019-03-13 MED ORDER — MAGNESIUM SULFATE 2 GM/50ML IV SOLN
2.0000 g | Freq: Once | INTRAVENOUS | Status: AC
  Administered 2019-03-13: 2 g via INTRAVENOUS
  Filled 2019-03-13: qty 50
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
## 2019-03-13 NOTE — ED Notes (Signed)
Patient was given a cup of Cranberry Juice.

## 2019-03-13 NOTE — ED Notes (Signed)
Pt did not handle it well being told by EDP with [employer] that she can not go home tonight, however she has calmed down & is being more cooperative.
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
## 2019-03-13 NOTE — Progress Notes (Signed)
Pt. meets criteria for inpatient treatment per Marvia Pickles, NP.  No appropriate beds available at [facility]. Referred out to the following hospitals: [facility] Details [facility] Details [facility] Details [place] Details [facility] Details [facility] Details [place] Details [facility] Details [facility] Details [facility]   Disposition CSW will continue to follow for placement.  Areatha Keas. Judi Cong, MSW, [place] Disposition Clinical Social Work [phone] (cell) [phone] (office)

## 2019-03-13 NOTE — ED Notes (Addendum)
Pt refused at this time to give urine per EDP orders, stating "I'm not pregnant" .. will encourage her to give a urine sample next time.

## 2019-03-13 NOTE — ED Notes (Signed)
Patient is carmer and sleeping at this time.

## 2019-03-13 NOTE — ED Notes (Signed)
Patient transported to CT 
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
## 2019-03-13 NOTE — ED Notes (Signed)
Pt is calmer & not yelling, while eating graham crackers & drinking orange juice.

## 2019-03-13 NOTE — ED Notes (Signed)
Pt continues to intermittently hysterically cry. Sitter at bedside.

## 2019-03-13 NOTE — [facility] Counselor (Signed)
  [place]    [facility] discussed case with [place] Provider, Marvia Pickles, NP who recommends inpatient treatment. TTS will look for inpatient placement.  Trentan Trippe L. [place], [place], [facility], [facility] Therapeutic Triage Specialist  [phone]
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
## 2019-03-14 MED ORDER — LORAZEPAM 1 MG PO TABS
1.0000 mg | ORAL_TABLET | Freq: Once | ORAL | Status: AC
  Administered 2019-03-14: 1 mg via ORAL
  Filled 2019-03-14: qty 1

## 2019-03-14 MED ORDER — STERILE WATER FOR INJECTION IJ SOLN
INTRAMUSCULAR | Status: AC
  Filled 2019-03-14: qty 10

## 2019-03-14 MED ORDER — LORAZEPAM 1 MG PO TABS
1.0000 mg | ORAL_TABLET | Freq: Once | ORAL | Status: AC
  Administered 2019-03-14: 17:00:00 1 mg via ORAL
  Filled 2019-03-14: qty 1

## 2019-03-14 MED ORDER — ZIPRASIDONE MESYLATE 20 MG IM SOLR
20.0000 mg | Freq: Once | INTRAMUSCULAR | Status: AC
  Administered 2019-03-14: 20 mg via INTRAMUSCULAR
  Filled 2019-03-14: qty 20

## 2019-03-14 NOTE — ED Notes (Signed)
Patient beginning to become tearful and crying out loudly.  PA notified for medication orders.

## 2019-03-14 NOTE — ED Notes (Signed)
Lunch tray @ bedside. 

## 2019-03-14 NOTE — ED Notes (Signed)
Patient calm and cooperative.  Had TTS done by telepsych this morning.  Patient slightly confused and paranoid about people following her in a car.  She reports having problems with neighbors in her apartment complex.  Expressed concern about staying in hospital due to having assignments due for classes she is taking.

## 2019-03-14 NOTE — BH Assessment (Signed)
[facility] Assessment Progress Note     Patient was reassessed by David Umberger, LCAS and case staffed with LaShunda Thomas, NP.  Patient continues to be recommended for inpatient treatment.  

## 2019-03-14 NOTE — ED Notes (Signed)
Patient recommended for inpatient treatment.  Patient beginning to ask questions like, "why am I being held here against my will."  "It is 10:20 I need to go home."

## 2019-03-14 NOTE — ED Notes (Signed)
Patient had another argument with her sitter.  Patient thinks that sitters are talking about her.

## 2019-03-14 NOTE — ED Notes (Signed)
Patient getting louder saying she wants to go home.  PA called to obtain an order for medication to calm patient down.

## 2019-03-14 NOTE — ED Notes (Signed)
Called patient assessment to get orders for medications.  Patient assessment is going to get NP to look over patient's chart to possibly order some medications.

## 2019-03-14 NOTE — ED Notes (Signed)
Patient up to the telephone to call her mother.  Beginning to say bizarre things like asking the sitter if she was racist just because sitter offered to help her by giving instructions on how to use the phone.  Patient said, "I am in college, I know how to use the phone."  "Are you racist or something?"  Her tech is african-american.  Patient beginning to act irritable but still redirectable.  Patient taking shower now.

## 2019-03-14 NOTE — ED Notes (Addendum)
Patient kept asking nurse about her disposition.  Nurse told patient that she was going to be transferred to another hospital to get some help.  Patient now crying loudly saying she doesn't want to be admitted.  She says she wants to go home and take her medications and continue her classes at [employer].  Patient continues to cry and will not accept any words of encouragement.  Nurse attempting to console patient.  Patient now walking calmly around the unit with sitter.

## 2019-03-14 NOTE — ED Provider Notes (Signed)
1315: informed by RN that pt is becoming agitated again. Needed Geodon and ativan x2 yesterday. No prn orders have been placed by psych or ed for agitation. I asked RN to call psych to see if they want to start pt on specific tx.   1330: informed by RN that psych is not answering. As such, will given 1 mg PO ativan. Consider geodon only if pt becomes more agitated. Ideally psych will start pt on something daily.     Franchot Heidelberg, PA-C 03/14/19 1454    Jola Schmidt, MD 03/16/19 1409

## 2019-03-14 NOTE — ED Notes (Signed)
Patient yelling and screaming in her room.  Still upset about being admitted.  She continues to say she wants to go home.  "Earlie Server are going to make me lose my children. "Earlie Server are going to make me lose everything."  Notified PA that patient's behavior has escalated and EKG cannot be done at this time.  PA to order medication for patient.

## 2019-03-14 NOTE — ED Notes (Signed)
Lunch ordered 

## 2019-03-14 NOTE — ED Notes (Signed)
Patient up at counter talking on phone saying she is not going to take any medication she would like to roll a blunt instead.  Patient trying to get in touch with her Mom but she was not at home.  Patient talking to someone else and saying she needs to leave the hospital and not be admitted because all they do is pump you full of medications and the food is horrible.  Patient walked back to her room and is now talking to herself again.

## 2019-03-14 NOTE — ED Notes (Signed)
Patient refuse any snack at the moment

## 2019-03-14 NOTE — Progress Notes (Signed)
CSW contacted referral facilities with the following results:  Still reviewing: [facility]  [place]: [place]  [facility]   TTS will continue to seek bed placement.   Chalmers Guest. Guerry Bruin, MSW, [place] Work/Disposition Phone: [phone] Fax: [phone]

## 2019-03-14 NOTE — ED Notes (Signed)
Patient resting quietly.

## 2019-03-14 NOTE — ED Notes (Signed)
Patient continues to say rude things to sitter, like she doesn't like Africans because they are coming over and taking all the jobs.  She tells sitter she is going to vote for Trump so he will send them back to [place].

## 2019-03-14 NOTE — ED Notes (Signed)
Telepsych completed by [employer] counselor.

## 2019-03-14 NOTE — ED Notes (Signed)
Dinner ordered 

## 2019-03-14 NOTE — ED Notes (Signed)
Patient calm but exhibiting paranoid behavior toward staff.  Patient wanted her tech's last name and her ID number like the tech had done something wrong but she had not.  Then patient came out of her room and asked nurse if she could get sitter's last name.  Nurse explained to patient that we do not give out employee's last names for security reasons and this was the policy throughout the hospital.  She then asked the number of this unit and nurse told her it was the psychiatric unit.  Patient then walked quietly back to her room.

## 2019-03-14 NOTE — Progress Notes (Signed)
Patient ID: Amanda Russell, female   DOB: 05-Apr-1996, 23 y.o.   MRN: [id number]  Following chart review, to manage bizarre behaviors/parnoid thinking I am recommending that patient start Zyprexa 5 mg po BID and Cogentin 0.5mg  p.o. BID. This medication should not be initiated until an EKG is completed and reviewed as QTc intervals may become prolonged. Following administration of medication patient should be monitored for EPS.

## 2019-03-14 NOTE — ED Notes (Signed)
Patient in room having a conversation with no one present.

## 2019-03-14 NOTE — ED Notes (Signed)
Patient walking to and from bathroom without difficulty.  Making phone call at nurse's station.

## 2019-03-14 NOTE — ED Provider Notes (Signed)
Pt pending placement for abnormal behavior with disorganized and bizarre thinking.   She is medically cleared, as potassium has normalized and HR improved. Labs, vitals, and RN notes reviewed.    Franchot Heidelberg, PA-C 03/14/19 0931    Julianne Rice, MD 03/14/19 1146

## 2019-03-15 DIAGNOSIS — F23 Brief psychotic disorder: Secondary | ICD-10-CM | POA: Diagnosis present

## 2019-03-15 DIAGNOSIS — R462 Strange and inexplicable behavior: Secondary | ICD-10-CM

## 2019-03-15 DIAGNOSIS — F29 Unspecified psychosis not due to a substance or known physiological condition: Secondary | ICD-10-CM

## 2019-03-15 MED ORDER — OLANZAPINE 5 MG PO TABS
5.0000 mg | ORAL_TABLET | Freq: Two times a day (BID) | ORAL | Status: DC
  Administered 2019-03-15: 5 mg via ORAL
  Filled 2019-03-15: qty 1

## 2019-03-15 MED ORDER — BENZTROPINE MESYLATE 1 MG PO TABS
0.5000 mg | ORAL_TABLET | Freq: Two times a day (BID) | ORAL | Status: DC
  Administered 2019-03-15: 11:00:00 0.5 mg via ORAL
  Filled 2019-03-15: qty 1

## 2019-03-15 NOTE — ED Provider Notes (Addendum)
Pt presenting under IVC for abnormal behavior with disorganized and bizarre thinking. Pt is pending placement.  Pt has had multiple times where she has needed medication to calm her (geodon and/or ativan). Previous provider ordered ekg, but was unabel to be done due to pt's agitation. Will perform now.   RN notes, labs, and vitals reviewed. Per [facility] rec's pt to be started on zyprexa 5 mg po bid and cogentin 0.5 mg po bid once ekg is reviewed. She will have to be monitored for EPS.   ekg with reassuring qt. Shows mild bradycardia and is changed from previous. As pt does not have acs sxs, will hold on any further cardiac investigation. Case discussed with attending, Dr. Jeanell Sparrow agrees to plan.    EKG Interpretation  Date/Time:  Sunday March 15 2019 08:58:46 EDT Ventricular Rate:  53 PR Interval:  148 QRS Duration: 82 QT Interval:  474 QTC Calculation: 444 R Axis:   72 Text Interpretation:  Sinus bradycardia with sinus arrhythmia T wave abnormality, consider anterior ischemia Abnormal ECG Confirmed by Pattricia Boss [phone]) on 03/15/2019 9:37:45 AM    Franchot Heidelberg, PA-C 03/15/19 0945    Pattricia Boss, MD 03/15/19 1341   1330: informed by [facility] that pt has ben calm and cooperative. They would like to send her home, however have not been able to reach her family to help monitor pt after d/c. Pt is not SI. Will discussed with attending, as I am unable to rescind IVC paperwork. Dr. Jeanell Sparrow evaluated the pt.    Franchot Heidelberg, PA-C 03/15/19 1343    Pattricia Boss, MD 03/15/19 1623

## 2019-03-15 NOTE — Progress Notes (Signed)
There have been multiple attempts to contact patient's family and they are not returning phone calls.  I did get in contact with the patient's cousin who stated that the mother was sleeping and she did not want to be woke up to speak to [place].  We waited several hours and the patient has shown no signs or symptoms of worsening behavior and has been very cooperative in the ED.  I have contacted PA-C Sophia and discussed this with her.  She stated she would talk to Dr. Jeanell Sparrow about assessing the patient to possibly rescind the IVC.  At this time patient is not a threat to herself or to others and does not meet inpatient criteria and is psychiatrically cleared.

## 2019-03-15 NOTE — BHH Counselor (Signed)
  REASSESSMENT  [facility] evaluated pt for safety and stability. Pt was observed sitting on the bed alert, pleasant and oriented x3.  Pt stated "I feel rested.  My mind feel clearer like I can do my school work.  I attend [employer].  My teacher missed one of my Blackboard classes, so we got to make it up." Pt denies SI/HI A/V-hallucinations.    Pt is able to articulate a discharge plan.  Pt states "I feel this is my last step, I need to get myself together.  I was holding on to my 2nd baby daddy's lies of [place] being together; but I realize that I need to live independently.  I stay with my mom when I'm not at my house.  My kids are in foster care but I'm going to work hard and get myself together and get my kids back.  I have an appointment scheduled at [place] on June 16."     [facility] provider, Marvia Pickles, NP will assess pt to determine current disposition.  Jrue Jarriel L. [place], [place], [facility], [facility] Therapeutic Triage Specialist  [phone]

## 2019-03-15 NOTE — ED Notes (Signed)
Pt DCd off unit to home per MD. Pt alert , calm, cooperative. Pt DC information reviewed and given to the pt with acknowledged understanding. Pt belongings given to the pt . Pt ambulatory off the unit. Pt uses bus for transport per pt.

## 2019-03-15 NOTE — ED Provider Notes (Signed)
Chart reviewed Discussed with patient IVC rescinded   Pattricia Boss, MD 03/15/19 1340
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
## 2019-03-16 LAB — NOVEL CORONAVIRUS, NAA (HOSP ORDER, SEND-OUT TO REF LAB; TAT 18-24 HRS): SARS-CoV-2, NAA: NOT DETECTED

## 2019-04-08 ENCOUNTER — Encounter (HOSPITAL_COMMUNITY): Payer: Self-pay | Admitting: *Deleted

## 2019-04-08 ENCOUNTER — Other Ambulatory Visit: Payer: Self-pay

## 2019-04-08 ENCOUNTER — Emergency Department (HOSPITAL_COMMUNITY)
Admission: EM | Admit: 2019-04-08 | Discharge: 2019-04-08 | Disposition: A | Discharge status: Home / Self Care | Payer: Self-pay | Attending: Emergency Medicine | Admitting: Emergency Medicine

## 2019-04-08 DIAGNOSIS — Z87891 Personal history of nicotine dependence: Secondary | ICD-10-CM | POA: Insufficient documentation

## 2019-04-08 DIAGNOSIS — K219 Gastro-esophageal reflux disease without esophagitis: Secondary | ICD-10-CM | POA: Insufficient documentation

## 2019-04-08 DIAGNOSIS — Z79899 Other long term (current) drug therapy: Secondary | ICD-10-CM | POA: Insufficient documentation

## 2019-04-08 DIAGNOSIS — W5503XA Scratched by cat, initial encounter: Secondary | ICD-10-CM | POA: Insufficient documentation

## 2019-04-08 DIAGNOSIS — Y939 Activity, unspecified: Secondary | ICD-10-CM | POA: Insufficient documentation

## 2019-04-08 DIAGNOSIS — L299 Pruritus, unspecified: Secondary | ICD-10-CM | POA: Insufficient documentation

## 2019-04-08 DIAGNOSIS — Y999 Unspecified external cause status: Secondary | ICD-10-CM | POA: Insufficient documentation

## 2019-04-08 DIAGNOSIS — Y929 Unspecified place or not applicable: Secondary | ICD-10-CM | POA: Insufficient documentation

## 2019-04-08 MED ORDER — TRIAMCINOLONE ACETONIDE 0.1 % EX CREA: 1.0000 "application " | TOPICAL_CREAM | Freq: Two times a day (BID) | CUTANEOUS | 0 refills | Status: DC

## 2019-04-08 NOTE — ED Triage Notes (Signed)
Pt said she was scratched by a cat in April and has been itching since then and a red mark popped up on her skin, she states she doesn't want her lungs messed up from the cat Pt also states she thinks she's been exposed to a STD

## 2019-04-08 NOTE — Discharge Instructions (Signed)
Apply steroid cream to areas of itching twice daily. Follow up with your PCP for re-evaluation if this persists.   You can go to the health department for any STD testing or treatment.   Return to the emergency department if any concerning signs or symptoms develop.

## 2019-04-08 NOTE — ED Provider Notes (Signed)
[facility]-EMERGENCY DEPT Provider Note   CSN: [id number] Arrival date & time: 04/08/19  0524    History   Chief Complaint Chief Complaint  Patient presents with  . cat scratch  . Exposure to STD    HPI Amanda Russell is a 23 y.o. female patient with history of cannabis abuse, psychosis, oppositional defiant disorder, ADHD presenting for evaluation of persistent wound to the right lower leg secondary to cat scratch 3-4 months ago.  Reports that sometime in March or April she was scratched by a cat that belonged to her friend.  Since then has had a wound that intermittently is pruritic.  Took Benadryl yesterday for this but did not like how it made her feel.  She is concerned that she needs a rabies vaccine. She states "I think a dog bit a cat". Denies fever, drainage, SOB, CP, nausea, or vomiting.  No new soaps, shampoos, detergents, or lotions.  No facial swelling.  She denies any other complaints, including any GU complaints.   Of note, she was [facility] and [facility] at [facility] emergency department for similar complaints 3 hours ago.      The history is provided by the patient and medical records.    Past Medical History:  Diagnosis Date  . ADHD (attention deficit hyperactivity disorder), combined type 07/14/2012  . Chlamydia   . Suicidal ideation     Patient Active Problem List   Diagnosis Date Noted  . Acute psychosis (HCC) 03/15/2019  . 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 Preterm premature rupture of membranes (PPROM) with unknown onset of labor 07/13/2014  . NVD (normal vaginal delivery) 07/13/2014  . Suicidal ideation 07/14/2012  . Depression 07/14/2012  . ADHD (attention deficit hyperactivity disorder), combined type 07/14/2012  . Oppositional defiant disorder 07/14/2012  . Parent-child relational problem 07/14/2012    Past Surgical History:  Procedure Laterality Date  . NO PAST SURGERIES       OB  History    Gravida  2   Para  2   Term  1   Preterm  1   AB      Living  2     SAB      TAB      Ectopic      Multiple      Live Births  2            Home Medications    Prior to Admission medications   Medication Sig Start Date End Date Taking? Authorizing Provider  acetaminophen (TYLENOL) 500 MG tablet Take 500-1,000 mg by mouth every 6 (six) hours as needed (for headaches or pain).    [provider]  Ensure (ENSURE) Take 237 mLs by mouth 3 (three) times daily. VANILLA    [provider]  Specialty Vitamins Products [facility](GNP CENTURY ENERGY METABOLISM) TABS Take 1 tablet by mouth daily.    [provider]  triamcinolone cream (KENALOG) 0.1 % Apply 1 application topically 2 (two) times daily. 04/08/19   Jeanie SewerFawze, Perrin Eddleman A, PA-C    Family History Family History  Problem Relation Age of Onset  . Hypertension Other   . Cancer Neg Hx   . Diabetes Neg Hx     Social History Social History   Tobacco Use  . Smoking status: Former Smoker    Packs/day: 0.50    Years: 0.80    Pack years: 0.40    Types: Cigarettes    Quit date:  01/07/2011    Years since quitting: 8.2  . Smokeless tobacco: Never Used  Substance Use Topics  . Alcohol use: Yes    Comment: socially  . Drug use: Not Currently    Frequency: 7.0 times per week    Types: Marijuana    Comment: denies     Allergies   Pollen extract   Review of Systems Review of Systems  Constitutional: Negative for chills and fever.  Respiratory: Negative for shortness of breath.   Cardiovascular: Negative for chest pain.  Gastrointestinal: Negative for abdominal pain, nausea and vomiting.  Skin: Positive for wound. Negative for color change.  All other systems reviewed and are negative.    Physical Exam Updated Vital Signs BP 105/72 (BP Location: Right Arm)   Pulse 61   Temp 98.6 F (37 C) (Oral)   Resp 16   Ht 5\' 5"  (1.651 m)   Wt 56.7 kg   SpO2 95%   BMI 20.80 kg/m    Physical Exam Vitals signs and nursing note reviewed.  Constitutional:      General: She is not in acute distress.    Appearance: She is well-developed.  HENT:     Head: Normocephalic and atraumatic.  Eyes:     General:        Right eye: No discharge.        Left eye: No discharge.     Conjunctiva/sclera: Conjunctivae normal.  Neck:     Vascular: No JVD.     Trachea: No tracheal deviation.  Cardiovascular:     Rate and Rhythm: Normal rate and regular rhythm.     Pulses: Normal pulses.     Heart sounds: Normal heart sounds.     Comments: 2+ DP/PT pulses bilaterally, no lower extremity edema Pulmonary:     Effort: Pulmonary effort is normal.     Breath sounds: Normal breath sounds.  Abdominal:     General: Abdomen is flat. Bowel sounds are normal. There is no distension.     Palpations: Abdomen is soft.     Tenderness: There is no abdominal tenderness. There is no guarding or rebound.  Musculoskeletal: Normal range of motion.        General: No tenderness.  Skin:    General: Skin is warm and dry.     Capillary Refill: Capillary refill takes less than 2 seconds.     Findings: No erythema or rash.     Comments: 5cm well-healed scratch to the dorsum of the right foot.  No erythema, induration, or abnormal drainage.  Neurological:     Mental Status: She is alert.  Psychiatric:        Behavior: Behavior normal.      ED Treatments / Results  Labs (all labs ordered are listed, but only abnormal results are displayed) Labs Reviewed - No data to display  EKG None  Radiology No results found.  Procedures Procedures (including critical care time)  Medications Ordered in ED Medications - No data to display   Initial Impression / Assessment and Plan / ED Course  I have reviewed the triage vital signs and the nursing notes.  Pertinent labs & imaging results that were available during my care of the patient were reviewed by me and considered in my medical decision  making (see chart for details).        Patient presents to the ED for evaluation of itchign after cat scratch months ago.  She is afebrile, vital signs are stable, nontoxic in  appearance. She has a well-healed scratch to the dorsum of the right foot, no signs of secondary skin infection or cat scratch fever (given she is afebrile, physical examination is reassuring with no lymphadenopathy). Triage note mentions concern for STDs but patient denies any exposures and has no GI/GU complaints. In the absence of symptoms and reassuring abdominal examination, I doubt PID, TOA, ectopic pregnancy, or ovarian torsion. Advised patient to follow up in the health department for routine STD treatment.  Informed patient once again that she does not require rabies vaccination.  No evidence of anaphylaxis, no active rash.  She exhibits bizarre behavior but does not seem to be an imminent threat to herself or others at this time and I do not think she requires emergent psychiatric assessment.  Will discharge with steroid cream for itching.  Discussed wound care.  Discussed follow-up with PCP and ED return precautions. Patient verbalized understanding of and agreement with plan and is safe for discharge home at this time.   Final Clinical Impressions(s) / ED Diagnoses   Final diagnoses:  Cat scratch    ED Discharge Orders         Ordered    triamcinolone cream (KENALOG) 0.1 %  2 times daily     04/08/19 0759           Jeanie SewerFawze, Jaslyne Beeck A, PA-C 04/08/19 [id number]    Mancel BaleWentz, Elliott, MD 04/08/19 [phone]

## 2019-04-08 NOTE — ED Triage Notes (Signed)
Patient states she was scratched by a cat in March and her skin has been itching , no rashes or scratches seen, states she had been r]eading about rabies and thought she needed the shots.

## 2019-04-08 NOTE — Discharge Instructions (Addendum)
You may use Tums, Pepcid, omeprazole over-the-counter as needed for acid reflux.  You may use Benadryl 50 mg every 8 hours as needed for itching.  This medication is found over-the-counter.   You do not need rabies vaccinations for animal scratches.

## 2019-04-08 NOTE — ED Provider Notes (Signed)
TIME SEEN: 4:21 AM  CHIEF COMPLAINT: Multiple complaints  HPI: Patient is a 23 year old female with history of ADHD, marijuana use who presents to the emergency department with multiple complaints.  Patient's first concern is that she was scratched by a cat in March and states that she is concerned now that she needs rabies vaccinations.  She was not bitten by any animals.   She states she came to this conclusion after reading things on the Internet.  She also reports that she has had a hard time sleeping recently, decreased appetite, itching all over without a rash and heartburn.  She has tried Benadryl that has helped with her insomnia and itching.  Has not tried any medications for acid reflux.  No chest pain or shortness of breath currently.  No vomiting or diarrhea.  States that she thinks it is heartburn because she will have a burning in her chest and will feel like something is "coming up in my mouth" after eating.  ROS: See HPI Constitutional: no fever  Eyes: no drainage  ENT: no runny nose   Cardiovascular:  no chest pain  Resp: no SOB  GI: no vomiting GU: no dysuria Integumentary: no rash  Allergy: no hives  Musculoskeletal: no leg swelling  Neurological: no slurred speech ROS otherwise negative  PAST MEDICAL HISTORY/PAST SURGICAL HISTORY:  Past Medical History:  Diagnosis Date  . ADHD (attention deficit hyperactivity disorder), combined type 07/14/2012  . Chlamydia   . Suicidal ideation     MEDICATIONS:  Prior to Admission medications   Medication Sig Start Date End Date Taking? Authorizing Provider  acetaminophen (TYLENOL) 500 MG tablet Take 500-1,000 mg by mouth every 6 (six) hours as needed (for headaches or pain).    [provider]  Ensure (ENSURE) Take 237 mLs by mouth 3 (three) times daily. VANILLA    [provider]  Specialty Vitamins Products ([place] METABOLISM) TABS Take 1 tablet by mouth daily.    [provider]     ALLERGIES:  Allergies  Allergen Reactions  . Pollen Extract Itching and Cough    SOCIAL HISTORY:  Social History   Tobacco Use  . Smoking status: Former Smoker    Packs/day: 0.50    Years: 0.80    Pack years: 0.40    Types: Cigarettes    Quit date: 01/07/2011    Years since quitting: 8.2  . Smokeless tobacco: Never Used  Substance Use Topics  . Alcohol use: Yes    Comment: socially    FAMILY HISTORY: Family History  Problem Relation Age of Onset  . Hypertension Other   . Cancer Neg Hx   . Diabetes Neg Hx     EXAM: BP 109/77 (BP Location: Left Arm)   Pulse 71   Temp 98.1 F (36.7 C) (Oral)   Resp 16   Ht 5\' 4"  (1.626 m)   Wt 49.9 kg   SpO2 97%   BMI 18.88 kg/m  CONSTITUTIONAL: Alert and oriented and responds appropriately to questions. Well-appearing; well-nourished HEAD: Normocephalic EYES: Conjunctivae clear, pupils appear equal, EOMI ENT: normal nose; moist mucous membranes NECK: Supple, no meningismus, no nuchal rigidity, no LAD  CARD: RRR; S1 and S2 appreciated; no murmurs, no clicks, no rubs, no gallops RESP: Normal chest excursion without splinting or tachypnea; breath sounds clear and equal bilaterally; no wheezes, no rhonchi, no rales, no hypoxia or respiratory distress, speaking full sentences ABD/GI: Normal bowel sounds; non-distended; soft, non-tender, no rebound, no guarding, no peritoneal  signs, no hepatosplenomegaly BACK:  The back appears normal and is non-tender to palpation, there is no CVA tenderness EXT: Normal ROM in all joints; non-tender to palpation; no edema; normal capillary refill; no cyanosis, no calf tenderness or swelling    SKIN: Normal color for age and race; warm; no rash, no urticaria NEURO: Moves all extremities equally PSYCH: Bizarre affect.  No SI or HI.  No hallucinations.  Grooming and personal hygiene are appropriate.  MEDICAL DECISION MAKING: Patient here with multiple complaints.  Complains of insomnia, itching,  GERD, concerns for needing rabies vaccination after a cat scratch 4 months ago.  Explained to patient that she does not need vaccinations for rabies for an animal scratch.  She denies that she was bitten by any animals.  Recommended that she use over-the-counter Benadryl as needed for itching and insomnia.  She has no rash on exam.  I recommended over-the-counter medications as needed for her acid reflux.  She is not having symptoms of acid reflux currently.  She describes as a burning pain and feels like "something comes up in my throat".  She is otherwise well-appearing.  She has a slightly bizarre affect but I do not feel she needs psychiatric evaluation.  Will discharge home.  At this time, I do not feel there is any life-threatening condition present. I have reviewed and discussed all results (EKG, imaging, lab, urine as appropriate) and exam findings with patient/family. I have reviewed nursing notes and appropriate previous records.  I feel the patient is safe to be discharged home without further emergent workup and can continue workup as an outpatient as needed. Discussed usual and customary return precautions. Patient/family verbalize understanding and are comfortable with this plan.  Outpatient follow-up has been provided as needed. All questions have been answered.      Fe Okubo, Layla MawKristen N, DO 04/08/19 [phone]

## 2019-04-11 ENCOUNTER — Ambulatory Visit (HOSPITAL_COMMUNITY)
Admission: EM | Admit: 2019-04-11 | Discharge: 2019-04-11 | Disposition: A | Discharge status: Home / Self Care | Payer: Self-pay | Attending: Physician Assistant | Admitting: Physician Assistant

## 2019-04-11 ENCOUNTER — Other Ambulatory Visit: Payer: Self-pay

## 2019-04-11 ENCOUNTER — Encounter (HOSPITAL_COMMUNITY): Payer: Self-pay

## 2019-04-11 DIAGNOSIS — K219 Gastro-esophageal reflux disease without esophagitis: Secondary | ICD-10-CM

## 2019-04-11 DIAGNOSIS — R1013 Epigastric pain: Secondary | ICD-10-CM

## 2019-04-11 MED ORDER — OMEPRAZOLE 20 MG PO CPDR: 20.0000 mg | DELAYED_RELEASE_CAPSULE | Freq: Every day | ORAL | 0 refills | Status: DC

## 2019-04-11 NOTE — Discharge Instructions (Signed)
Start omeprazole as directed. Try to avoid spicy foods, fatty foods, alcohol, caffeine, cigarette for now. Keep hydrated, urine should be clear to pale yellow in color. Follow up with PCP if symptoms not improving. If experiencing worsening symptoms, vomiting blood, blood in stool, abdominal pain worsens and unable to jump up and down due to pain, go to the ED for further evaluation needed.

## 2019-04-11 NOTE — ED Provider Notes (Signed)
[facility]    CSN: [id number] Arrival date & time: 04/11/19  1308     History   Chief Complaint Chief Complaint  Patient presents with  . Gastroesophageal Reflux    HPI Amanda Russell is a 23 y.o. female.   23 year old female comes in for 2-week history of acid reflux.  States she has belching, with spitting up acid, some burning sensation to the chest.  She has intermittent epigastric pain.  Has intermittent nausea.  Apart from spitting up, no vomiting.  Denies hematemesis.  Denies diarrhea or constipation.  Denies melena, hematochezia.  Her diet consists of mostly fried food.  Current everyday smoker.  Has been trying to decrease caffeine intake due to symptoms.     Past Medical History:  Diagnosis Date  . ADHD (attention deficit hyperactivity disorder), combined type 07/14/2012  . Chlamydia   . Suicidal ideation     Patient Active Problem List   Diagnosis Date Noted  . Acute psychosis ([place]) 03/15/2019  . 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 Preterm premature rupture of membranes (PPROM) with unknown onset of labor 07/13/2014  . NVD (normal vaginal delivery) 07/13/2014  . Suicidal ideation 07/14/2012  . Depression 07/14/2012  . ADHD (attention deficit hyperactivity disorder), combined type 07/14/2012  . Oppositional defiant disorder 07/14/2012  . Parent-child relational problem 07/14/2012    Past Surgical History:  Procedure Laterality Date  . NO PAST SURGERIES      OB History    Gravida  2   Para  2   Term  1   Preterm  1   AB      Living  2     SAB      TAB      Ectopic      Multiple      Live Births  2            Home Medications    Prior to Admission medications   Medication Sig Start Date End Date Taking? Authorizing Provider  acetaminophen (TYLENOL) 500 MG tablet Take 500-1,000 mg by mouth every 6 (six) hours as needed (for headaches or pain).    [provider]   Ensure (ENSURE) Take 237 mLs by mouth 3 (three) times daily. VANILLA    [provider]  omeprazole (PRILOSEC) 20 MG capsule Take 1 capsule (20 mg total) by mouth daily. 04/11/19   Ok Edwards, PA-C  Specialty Vitamins Products ([place] METABOLISM) TABS Take 1 tablet by mouth daily.    [provider]  triamcinolone cream (KENALOG) 0.1 % Apply 1 application topically 2 (two) times daily. 04/08/19   Renita Papa, PA-C    Family History Family History  Problem Relation Age of Onset  . Hypertension Other   . Cancer Neg Hx   . Diabetes Neg Hx     Social History Social History   Tobacco Use  . Smoking status: Former Smoker    Packs/day: 0.50    Years: 0.80    Pack years: 0.40    Types: Cigarettes    Quit date: 01/07/2011    Years since quitting: 8.2  . Smokeless tobacco: Never Used  Substance Use Topics  . Alcohol use: Yes    Comment: socially  . Drug use: Not Currently    Frequency: 7.0 times per week    Types: Marijuana    Comment: denies     Allergies   Pollen extract   Review  of Systems Review of Systems  Reason unable to perform ROS: See HPI as above.     Physical Exam Triage Vital Signs ED Triage Vitals  Enc Vitals Group     BP 04/11/19 1447 106/66     Pulse Rate 04/11/19 1447 66     Resp 04/11/19 1447 16     Temp 04/11/19 1447 98.9 F (37.2 C)     Temp src --      SpO2 04/11/19 1447 100 %     Weight 04/11/19 1448 110 lb (49.9 kg)     Height --      Head Circumference --      Peak Flow --      Pain Score 04/11/19 1448 3     Pain Loc --      Pain Edu? --      Excl. in [place]? --    No data found.  Updated Vital Signs BP 106/66 (BP Location: Right Arm)   Pulse 66   Temp 98.9 F (37.2 C)   Resp 16   Wt 110 lb (49.9 kg)   LMP 04/08/2019   SpO2 100%   BMI 18.30 kg/m   Physical Exam Constitutional:      General: She is not in acute distress.    Appearance: She is well-developed. She is not ill-appearing, toxic-appearing  or diaphoretic.  HENT:     Head: Normocephalic and atraumatic.  Eyes:     Conjunctiva/sclera: Conjunctivae normal.     Pupils: Pupils are equal, round, and reactive to light.  Cardiovascular:     Rate and Rhythm: Normal rate and regular rhythm.     Heart sounds: Normal heart sounds. No murmur. No friction rub. No gallop.   Pulmonary:     Effort: Pulmonary effort is normal.     Breath sounds: Normal breath sounds. No wheezing or rales.  Abdominal:     General: Bowel sounds are normal.     Palpations: Abdomen is soft.     Tenderness: There is abdominal tenderness in the epigastric area. There is no right CVA tenderness, left CVA tenderness, guarding or rebound.  Skin:    General: Skin is warm and dry.  Neurological:     Mental Status: She is alert and oriented to person, place, and time.  Psychiatric:        Behavior: Behavior normal.        Judgment: Judgment normal.     UC Treatments / Results  Labs (all labs ordered are listed, but only abnormal results are displayed) Labs Reviewed - No data to display  EKG   Radiology No results found.  Procedures Procedures (including critical care time)  Medications Ordered in UC Medications - No data to display  Initial Impression / Assessment and Plan / UC Course  I have reviewed the triage vital signs and the nursing notes.  Pertinent labs & imaging results that were available during my care of the patient were reviewed by me and considered in my medical decision making (see chart for details).    No alarming signs on exam.  Epigastric pain on palpation without rebound or guarding.  Will provide omeprazole as directed for GERD.  Discussed diet/food choices to help prevent GERD.  Discussed avoiding alcohol, smoking at this time.  Return precautions given.  Patient expresses understanding and agrees to plan.  Final Clinical Impressions(s) / UC Diagnoses   Final diagnoses:  Abdominal pain, epigastric  Gastroesophageal reflux  disease without esophagitis  ED Prescriptions    Medication Sig Dispense Auth. Provider   omeprazole (PRILOSEC) 20 MG capsule Take 1 capsule (20 mg total) by mouth daily. 15 capsule Threasa AlphaYu, Zareya Tuckett V, PA-C        Guliana Weyandt V, [place] JerseyPA-C 04/11/19 1538

## 2019-04-11 NOTE — ED Triage Notes (Signed)
Pt states she has acid reflux. X 2 weeks

## 2019-04-27 ENCOUNTER — Other Ambulatory Visit: Payer: Self-pay

## 2019-04-27 ENCOUNTER — Ambulatory Visit (HOSPITAL_COMMUNITY)
Admission: EM | Admit: 2019-04-27 | Discharge: 2019-04-27 | Disposition: A | Discharge status: Home / Self Care | Payer: Self-pay | Attending: Family Medicine | Admitting: Family Medicine

## 2019-04-27 ENCOUNTER — Encounter (HOSPITAL_COMMUNITY): Payer: Self-pay | Admitting: Emergency Medicine

## 2019-04-27 DIAGNOSIS — Z202 Contact with and (suspected) exposure to infections with a predominantly sexual mode of transmission: Secondary | ICD-10-CM

## 2019-04-27 DIAGNOSIS — Z3202 Encounter for pregnancy test, result negative: Secondary | ICD-10-CM

## 2019-04-27 DIAGNOSIS — N898 Other specified noninflammatory disorders of vagina: Secondary | ICD-10-CM | POA: Insufficient documentation

## 2019-04-27 LAB — POCT URINALYSIS DIP (DEVICE)
Bilirubin Urine: NEGATIVE
Glucose, UA: NEGATIVE mg/dL
Hgb urine dipstick: NEGATIVE
Ketones, ur: NEGATIVE mg/dL
Leukocytes,Ua: NEGATIVE
Nitrite: NEGATIVE
Protein, ur: NEGATIVE mg/dL
Specific Gravity, Urine: >=1.03 (ref 1.005–1.030)
Urobilinogen, UA: 0.2 mg/dL (ref 0.0–1.0)
pH: 5.5 (ref 5.0–8.0)

## 2019-04-27 LAB — POCT PREGNANCY, URINE: Preg Test, Ur: NEGATIVE

## 2019-04-27 MED ORDER — CEFTRIAXONE SODIUM 250 MG IJ SOLR
250.0000 mg | Freq: Once | INTRAMUSCULAR | Status: AC
  Administered 2019-04-27: 250 mg via INTRAMUSCULAR

## 2019-04-27 MED ORDER — METRONIDAZOLE 500 MG PO TABS: 500.0000 mg | ORAL_TABLET | Freq: Two times a day (BID) | ORAL | 0 refills | Status: DC

## 2019-04-27 MED ORDER — AZITHROMYCIN 250 MG PO TABS
1000.0000 mg | ORAL_TABLET | Freq: Once | ORAL | Status: AC
  Administered 2019-04-27: 1000 mg via ORAL

## 2019-04-27 MED ORDER — CEFTRIAXONE SODIUM 250 MG IJ SOLR
INTRAMUSCULAR | Status: AC
  Filled 2019-04-27: qty 250

## 2019-04-27 MED ORDER — LIDOCAINE HCL (PF) 1 % IJ SOLN
INTRAMUSCULAR | Status: AC
  Filled 2019-04-27: qty 2

## 2019-04-27 MED ORDER — AZITHROMYCIN 250 MG PO TABS
ORAL_TABLET | ORAL | Status: AC
  Filled 2019-04-27: qty 4

## 2019-04-27 NOTE — ED Provider Notes (Signed)
[facility]    CSN: [id number] Arrival date & time: 04/27/19  [id number]     History   Chief Complaint Chief Complaint  Patient presents with  . Exposure to STD    HPI Amanda Russell is a 23 y.o. female.   Patient is a 23 year old female past medical history of ADHD, chlamydia, SI, acute psychosis, cannabis abuse, oppositional defiant disorder.  She presents today for approximately 3 to 4 days of vaginal discharge, lower abdominal discomfort.  Reports symptoms started not long after having a new sexual encounter with a new partner.  She is concerned for STDs.  Describes the discharge is white, milky and foul odor.  No vaginal bleeding.Patient's last menstrual period was 04/08/2019. Denies any dysuria, hematuria urinary frequency.  No known fevers at home.  But she is felt warm to touch.  ROS per HPI      Past Medical History:  Diagnosis Date  . ADHD (attention deficit hyperactivity disorder), combined type 07/14/2012  . Chlamydia   . Suicidal ideation     Patient Active Problem List   Diagnosis Date Noted  . Acute psychosis ([facility]) 03/15/2019  . 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 Preterm premature rupture of membranes (PPROM) with unknown onset of labor 07/13/2014  . NVD (normal vaginal delivery) 07/13/2014  . Suicidal ideation 07/14/2012  . Depression 07/14/2012  . ADHD (attention deficit hyperactivity disorder), combined type 07/14/2012  . Oppositional defiant disorder 07/14/2012  . Parent-child relational problem 07/14/2012    Past Surgical History:  Procedure Laterality Date  . NO PAST SURGERIES      OB History    Gravida  2   Para  2   Term  1   Preterm  1   AB      Living  2     SAB      TAB      Ectopic      Multiple      Live Births  2            Home Medications    Prior to Admission medications   Medication Sig Start Date End Date Taking? Authorizing Provider  acetaminophen  (TYLENOL) 500 MG tablet Take 500-1,000 mg by mouth every 6 (six) hours as needed (for headaches or pain).    [provider]  Ensure (ENSURE) Take 237 mLs by mouth 3 (three) times daily. VANILLA    [provider]  metroNIDAZOLE (FLAGYL) 500 MG tablet Take 1 tablet (500 mg total) by mouth 2 (two) times daily. 04/27/19   Dahlia ByesBast, Rayquan Amrhein A, NP  omeprazole (PRILOSEC) 20 MG capsule Take 1 capsule (20 mg total) by mouth daily. 04/11/19   Belinda FisherYu, Amy V, PA-C  Specialty Vitamins Products [facility] METABOLISM) TABS Take 1 tablet by mouth daily.    [provider]  triamcinolone cream (KENALOG) 0.1 % Apply 1 application topically 2 (two) times daily. 04/08/19   Jeanie SewerFawze, Mina A, PA-C    Family History Family History  Problem Relation Age of Onset  . Hypertension Other   . Cancer Neg Hx   . Diabetes Neg Hx     Social History Social History   Tobacco Use  . Smoking status: Former Smoker    Packs/day: 0.50    Years: 0.80    Pack years: 0.40    Types: Cigarettes    Quit date: 01/07/2011    Years since quitting: 8.3  . Smokeless tobacco: Never Used  Substance Use Topics  . Alcohol use: Yes    Comment: socially  . Drug use: Not Currently    Frequency: 7.0 times per week    Types: Marijuana    Comment: denies     Allergies   Pollen extract   Review of Systems Review of Systems   Physical Exam Triage Vital Signs ED Triage Vitals [04/27/19 1034]  Enc Vitals Group     BP 124/82     Pulse Rate 91     Resp 18     Temp 98.5 F (36.9 C)     Temp Source Oral     SpO2 99 %     Weight      Height      Head Circumference      Peak Flow      Pain Score 3     Pain Loc      Pain Edu?      Excl. in [place]?    No data found.  Updated Vital Signs BP 124/82 (BP Location: Right Arm)   Pulse 91   Temp 98.5 F (36.9 C) (Oral)   Resp 18   LMP 04/08/2019   SpO2 99%   Visual Acuity Right Eye Distance:   Left Eye Distance:   Bilateral Distance:    Right Eye  Near:   Left Eye Near:    Bilateral Near:     Physical Exam Vitals signs and nursing note reviewed.  Constitutional:      General: She is not in acute distress.    Appearance: Normal appearance. She is not ill-appearing, toxic-appearing or diaphoretic.  HENT:     Head: Normocephalic and atraumatic.     Nose: Nose normal.     Mouth/Throat:     Pharynx: Oropharynx is clear.  Eyes:     Conjunctiva/sclera: Conjunctivae normal.  Neck:     Musculoskeletal: Normal range of motion.  Cardiovascular:     Rate and Rhythm: Normal rate.  Pulmonary:     Effort: Pulmonary effort is normal.  Abdominal:     Palpations: Abdomen is soft.     Tenderness: There is no abdominal tenderness.  Genitourinary:    Vagina: Vaginal discharge present.     Comments: External vaginal exam without lesions, swelling, tenderness.  White thin milky discharge noted around vaginal opening and in vaginal vault and from cervical opening.  Malodorous and moderate amount. Mild cervical friability. No cervical motion tenderness  Skin:    General: Skin is warm and dry.  Neurological:     Mental Status: She is alert.      UC Treatments / Results  Labs (all labs ordered are listed, but only abnormal results are displayed) Labs Reviewed  HIV ANTIBODY (ROUTINE TESTING W REFLEX)  RPR  POC URINE PREG, ED  POCT URINALYSIS DIP (DEVICE)  POCT PREGNANCY, URINE  CERVICOVAGINAL ANCILLARY ONLY    EKG   Radiology No results found.  Procedures Procedures (including critical care time)  Medications Ordered in UC Medications  cefTRIAXone (ROCEPHIN) injection 250 mg (250 mg Intramuscular Given 04/27/19 1116)  azithromycin (ZITHROMAX) tablet 1,000 mg (1,000 mg Oral Given 04/27/19 1116)  azithromycin (ZITHROMAX) 250 MG tablet (has no administration in time range)  cefTRIAXone (ROCEPHIN) 250 MG injection (has no administration in time range)  lidocaine (PF) (XYLOCAINE) 1 % injection (has no administration in time  range)  lidocaine (PF) (XYLOCAINE) 1 % injection (has no administration in time range)    Initial Impression / Assessment and Plan /  UC Course  I have reviewed the triage vital signs and the nursing notes.  Pertinent labs & imaging results that were available during my care of the patient were reviewed by me and considered in my medical decision making (see chart for details).     Treating for STDs today in clinic based on history and exam. Also covering for bacterial vaginosis There is no cervical motion tenderness or concerns for PID today. Send swab for testing and labs are pending.  Recommended safe sex practices. Urine was negative for pregnancy or infection here today. Final Clinical Impressions(s) / UC Diagnoses   Final diagnoses:  Vaginal discharge     Discharge Instructions     Treating you for gonorrhea and chlamydia here in clinic today.  Sending swab for testing and testing for HIV and syphilis. Also treating for bacterial vaginosis with Flagyl.  Prescription sent to pharmacy. Refrain from sexual activity Follow up as needed for continued or worsening symptoms     ED Prescriptions    Medication Sig Dispense Auth. Provider   metroNIDAZOLE (FLAGYL) 500 MG tablet Take 1 tablet (500 mg total) by mouth 2 (two) times daily. 14 tablet Dahlia ByesBast, [place] Viereck A, NP     Controlled Substance Prescriptions [place] Controlled Substance Registry consulted? Not Applicable   Janace ArisBast, Demara Lover A, NP 04/27/19 1157

## 2019-04-27 NOTE — ED Triage Notes (Signed)
Pt here for STD screening after pt sts she had unprotected sex with female as well as oral sex; pt sts throat pain and lower abd pain

## 2019-04-27 NOTE — Discharge Instructions (Addendum)
Treating you for gonorrhea and chlamydia here in clinic today.  Sending swab for testing and testing for HIV and syphilis. Also treating for bacterial vaginosis with Flagyl.  Prescription sent to pharmacy. Refrain from sexual activity Follow up as needed for continued or worsening symptoms

## 2019-04-28 LAB — HIV ANTIBODY (ROUTINE TESTING W REFLEX): HIV Screen 4th Generation wRfx: NONREACTIVE

## 2019-04-28 LAB — RPR: RPR Ser Ql: NONREACTIVE

## 2019-04-29 LAB — CERVICOVAGINAL ANCILLARY ONLY
Bacterial vaginitis: POSITIVE — AB
Candida vaginitis: NEGATIVE
Chlamydia: NEGATIVE
Neisseria Gonorrhea: NEGATIVE
Trichomonas: NEGATIVE

## 2019-10-02 NOTE — L&D Delivery Note (Signed)
Operative Delivery Note At 7:41 PM a viable and healthy female was delivered via Vaginal, Spontaneous.  Presentation:vertex; Position: Right,, Occiput,, Anterior; Station: +5.  Pt pushed for approximately 11 minutes. With delivery of the head to crowning the anterior shoulder did not easily deliver and a shoulder dystocia ws called. The patient was placed in [place]ts and the anterior shoulder easily delivered with the next maternal push. Total duration of shoulder dystocia 15 seconds. The infant was placed on the maternal abdomen and after a 1 minute delay the cord was clamped and cut. The placenta delivered spontaneously, intact. No lacerations required repair  Delivery of the head:   ,   Second maneuver: ,  Mc[place]ts Third maneuver: ,   Fourth maneuver: ,   Fifth maneuver: ,   Sixth maneuver: ,    Verbal consent: obtained from patient.  APGAR: 8, 9; weight pending  .   Placenta status: spontaneous, intact.   Cord: 3V    Anesthesia:  Epidural Episiotomy: None Lacerations: None Suture Repair: NA Est. Blood Loss (mL):  318 cc  Mom to postpartum.  Baby to Couplet care / Skin to Skin.  Waynard Reeds 09/04/2020, 7:58 PM

## 2019-10-15 ENCOUNTER — Encounter (HOSPITAL_COMMUNITY): Payer: Self-pay | Admitting: Emergency Medicine

## 2019-10-15 ENCOUNTER — Other Ambulatory Visit: Payer: Self-pay

## 2019-10-15 DIAGNOSIS — N8311 Corpus luteum cyst of right ovary: Secondary | ICD-10-CM | POA: Insufficient documentation

## 2019-10-15 DIAGNOSIS — Z79899 Other long term (current) drug therapy: Secondary | ICD-10-CM | POA: Insufficient documentation

## 2019-10-15 DIAGNOSIS — Z87891 Personal history of nicotine dependence: Secondary | ICD-10-CM | POA: Insufficient documentation

## 2019-10-15 DIAGNOSIS — O00102 Left tubal pregnancy without intrauterine pregnancy: Secondary | ICD-10-CM | POA: Insufficient documentation

## 2019-10-15 DIAGNOSIS — Z3A01 Less than 8 weeks gestation of pregnancy: Secondary | ICD-10-CM | POA: Insufficient documentation

## 2019-10-15 DIAGNOSIS — Z20822 Contact with and (suspected) exposure to covid-19: Secondary | ICD-10-CM | POA: Insufficient documentation

## 2019-10-15 NOTE — ED Triage Notes (Signed)
The patient presents with nausea,vomiting and abdominal pain for 2 days. The last thing she was able to eat was Amanda Russell. She also complains of headaches, body aches and chills. EMS administered 500 of fluids and 4mg  of Zofran.   EMS vitals: 130/86 BP 90 HR 129 CBG 100% O2 sat on room air

## 2019-10-16 ENCOUNTER — Emergency Department (HOSPITAL_COMMUNITY): Payer: Self-pay

## 2019-10-16 ENCOUNTER — Ambulatory Visit (HOSPITAL_COMMUNITY)
Admission: EM | Admit: 2019-10-16 | Discharge: 2019-10-16 | Disposition: A | Discharge status: Home / Self Care | Payer: Self-pay | Attending: Obstetrics and Gynecology | Admitting: Obstetrics and Gynecology

## 2019-10-16 ENCOUNTER — Emergency Department (HOSPITAL_COMMUNITY): Payer: Self-pay | Admitting: Anesthesiology

## 2019-10-16 ENCOUNTER — Encounter (HOSPITAL_COMMUNITY): Payer: Self-pay | Admitting: Obstetrics and Gynecology

## 2019-10-16 ENCOUNTER — Encounter (HOSPITAL_COMMUNITY)
Admission: EM | Disposition: A | Discharge status: Home / Self Care | Payer: Self-pay | Source: Home / Self Care | Attending: Emergency Medicine

## 2019-10-16 DIAGNOSIS — O00102 Left tubal pregnancy without intrauterine pregnancy: Secondary | ICD-10-CM

## 2019-10-16 DIAGNOSIS — Z9889 Other specified postprocedural states: Secondary | ICD-10-CM

## 2019-10-16 DIAGNOSIS — O009 Unspecified ectopic pregnancy without intrauterine pregnancy: Secondary | ICD-10-CM

## 2019-10-16 HISTORY — PX: LAPAROSCOPY: SHX197

## 2019-10-16 HISTORY — DX: Left tubal pregnancy without intrauterine pregnancy: O00.102

## 2019-10-16 LAB — URINALYSIS, ROUTINE W REFLEX MICROSCOPIC
Bilirubin Urine: NEGATIVE
Glucose, UA: NEGATIVE mg/dL
Hgb urine dipstick: NEGATIVE
Ketones, ur: 20 mg/dL — AB
Leukocytes,Ua: NEGATIVE
Nitrite: NEGATIVE
Protein, ur: 100 mg/dL — AB
Specific Gravity, Urine: 1.032 — ABNORMAL HIGH (ref 1.005–1.030)
pH: 6 (ref 5.0–8.0)

## 2019-10-16 LAB — COMPREHENSIVE METABOLIC PANEL
ALT: 17 U/L (ref 0–44)
AST: 20 U/L (ref 15–41)
Albumin: 4.9 g/dL (ref 3.5–5.0)
Alkaline Phosphatase: 52 U/L (ref 38–126)
Anion gap: 8 (ref 5–15)
BUN: 11 mg/dL (ref 6–20)
CO2: 22 mmol/L (ref 22–32)
Calcium: 9.5 mg/dL (ref 8.9–10.3)
Chloride: 107 mmol/L (ref 98–111)
Creatinine, Ser: 0.71 mg/dL (ref 0.44–1.00)
GFR calc Af Amer: >60 mL/min (ref >60)
GFR calc non Af Amer: >60 mL/min (ref >60)
Glucose, Bld: 115 mg/dL — ABNORMAL HIGH (ref 70–99)
Potassium: 3.6 mmol/L (ref 3.5–5.1)
Sodium: 137 mmol/L (ref 135–145)
Total Bilirubin: 1.3 mg/dL — ABNORMAL HIGH (ref 0.3–1.2)
Total Protein: 8.3 g/dL — ABNORMAL HIGH (ref 6.5–8.1)

## 2019-10-16 LAB — CBC WITH DIFFERENTIAL/PLATELET
Abs Immature Granulocytes: 0.06 10*3/uL (ref 0.00–0.07)
Basophils Absolute: 0 10*3/uL (ref 0.0–0.1)
Basophils Relative: 0 %
Eosinophils Absolute: 0 10*3/uL (ref 0.0–0.5)
Eosinophils Relative: 0 %
HCT: 35.7 % — ABNORMAL LOW (ref 36.0–46.0)
Hemoglobin: 12.2 g/dL (ref 12.0–15.0)
Immature Granulocytes: 1 %
Lymphocytes Relative: 8 %
Lymphs Abs: 1 10*3/uL (ref 0.7–4.0)
MCH: 29.6 pg (ref 26.0–34.0)
MCHC: 34.2 g/dL (ref 30.0–36.0)
MCV: 86.7 fL (ref 80.0–100.0)
Monocytes Absolute: 0.6 10*3/uL (ref 0.1–1.0)
Monocytes Relative: 5 %
Neutro Abs: 10.8 10*3/uL — ABNORMAL HIGH (ref 1.7–7.7)
Neutrophils Relative %: 86 %
Platelets: 232 10*3/uL (ref 150–400)
RBC: 4.12 MIL/uL (ref 3.87–5.11)
RDW: 12.3 % (ref 11.5–15.5)
WBC: 12.5 10*3/uL — ABNORMAL HIGH (ref 4.0–10.5)
nRBC: 0 % (ref 0.0–0.2)

## 2019-10-16 LAB — WET PREP, GENITAL
Clue Cells Wet Prep HPF POC: NONE SEEN
Sperm: NONE SEEN
Yeast Wet Prep HPF POC: NONE SEEN

## 2019-10-16 LAB — RESPIRATORY PANEL BY RT PCR (FLU A&B, COVID)
Influenza A by PCR: NEGATIVE
Influenza B by PCR: NEGATIVE
SARS Coronavirus 2 by RT PCR: NEGATIVE

## 2019-10-16 LAB — I-STAT BETA HCG BLOOD, ED (MC, WL, AP ONLY): I-stat hCG, quantitative: >2000 m[IU]/mL — ABNORMAL HIGH (ref <5)

## 2019-10-16 LAB — ABO/RH: ABO/RH(D): B POS

## 2019-10-16 LAB — TYPE AND SCREEN
ABO/RH(D): B POS
Antibody Screen: NEGATIVE

## 2019-10-16 LAB — LIPASE, BLOOD: Lipase: 29 U/L (ref 11–51)

## 2019-10-16 LAB — HCG, QUANTITATIVE, PREGNANCY: hCG, Beta Chain, Quant, S: 5698 m[IU]/mL — ABNORMAL HIGH (ref <5)

## 2019-10-16 SURGERY — LAPAROSCOPY, DIAGNOSTIC
Anesthesia: General | Site: Abdomen | Laterality: Left

## 2019-10-16 MED ORDER — BUPIVACAINE HCL 0.25 % IJ SOLN
INTRAMUSCULAR | Status: AC
  Filled 2019-10-16: qty 1

## 2019-10-16 MED ORDER — FENTANYL CITRATE (PF) 100 MCG/2ML IJ SOLN
INTRAMUSCULAR | Status: AC
  Filled 2019-10-16: qty 2

## 2019-10-16 MED ORDER — DEXAMETHASONE SODIUM PHOSPHATE 10 MG/ML IJ SOLN
INTRAMUSCULAR | Status: DC | PRN
  Administered 2019-10-16: 8 mg via INTRAVENOUS

## 2019-10-16 MED ORDER — ACETAMINOPHEN 10 MG/ML IV SOLN
INTRAVENOUS | Status: AC
  Administered 2019-10-16: 16:00:00 1000 mg via INTRAVENOUS
  Filled 2019-10-16: qty 100

## 2019-10-16 MED ORDER — LACTATED RINGERS IV SOLN
INTRAVENOUS | Status: DC | PRN
  Administered 2019-10-16: 1000 mL

## 2019-10-16 MED ORDER — LIDOCAINE 2% (20 MG/ML) 5 ML SYRINGE
INTRAMUSCULAR | Status: AC
  Filled 2019-10-16: qty 5

## 2019-10-16 MED ORDER — SUCCINYLCHOLINE CHLORIDE 200 MG/10ML IV SOSY
PREFILLED_SYRINGE | INTRAVENOUS | Status: AC
  Filled 2019-10-16: qty 30

## 2019-10-16 MED ORDER — EPHEDRINE 5 MG/ML INJ
INTRAVENOUS | Status: AC
  Filled 2019-10-16: qty 20

## 2019-10-16 MED ORDER — DOCUSATE SODIUM 100 MG PO CAPS: 100.0000 mg | ORAL_CAPSULE | Freq: Two times a day (BID) | ORAL | 0 refills | Status: AC

## 2019-10-16 MED ORDER — KETOROLAC TROMETHAMINE 30 MG/ML IJ SOLN
INTRAMUSCULAR | Status: AC
  Filled 2019-10-16: qty 1

## 2019-10-16 MED ORDER — HYDROMORPHONE HCL 1 MG/ML IJ SOLN
INTRAMUSCULAR | Status: AC
  Administered 2019-10-16: 16:00:00 0.5 mg via INTRAVENOUS
  Filled 2019-10-16: qty 1

## 2019-10-16 MED ORDER — LACTATED RINGERS IV SOLN: INTRAVENOUS | Status: DC

## 2019-10-16 MED ORDER — HYDROMORPHONE HCL 1 MG/ML IJ SOLN
INTRAMUSCULAR | Status: AC
  Filled 2019-10-16: qty 1

## 2019-10-16 MED ORDER — KETOROLAC TROMETHAMINE 30 MG/ML IJ SOLN
INTRAMUSCULAR | Status: DC | PRN
  Administered 2019-10-16: 30 mg via INTRAVENOUS

## 2019-10-16 MED ORDER — SUCCINYLCHOLINE CHLORIDE 200 MG/10ML IV SOSY
PREFILLED_SYRINGE | INTRAVENOUS | Status: DC | PRN
  Administered 2019-10-16: 100 mg via INTRAVENOUS

## 2019-10-16 MED ORDER — LIDOCAINE 2% (20 MG/ML) 5 ML SYRINGE
INTRAMUSCULAR | Status: DC | PRN
  Administered 2019-10-16: 100 mg via INTRAVENOUS

## 2019-10-16 MED ORDER — SODIUM CHLORIDE 0.9 % IV BOLUS
1000.0000 mL | Freq: Once | INTRAVENOUS | Status: AC
  Administered 2019-10-16: 1000 mL via INTRAVENOUS

## 2019-10-16 MED ORDER — FENTANYL CITRATE (PF) 250 MCG/5ML IJ SOLN
INTRAMUSCULAR | Status: DC | PRN
  Administered 2019-10-16 (×2): 50 ug via INTRAVENOUS

## 2019-10-16 MED ORDER — OXYCODONE-ACETAMINOPHEN 5-325 MG PO TABS: 1.0000 | ORAL_TABLET | Freq: Four times a day (QID) | ORAL | 0 refills | Status: DC | PRN

## 2019-10-16 MED ORDER — PROPOFOL 10 MG/ML IV BOLUS
INTRAVENOUS | Status: DC | PRN
  Administered 2019-10-16: 200 mg via INTRAVENOUS

## 2019-10-16 MED ORDER — OXYCODONE HCL 5 MG/5ML PO SOLN: 5.0000 mg | Freq: Once | ORAL | Status: DC | PRN

## 2019-10-16 MED ORDER — HYDROMORPHONE HCL 1 MG/ML IJ SOLN
0.2500 mg | INTRAMUSCULAR | Status: DC | PRN
  Administered 2019-10-16: 16:00:00 0.5 mg via INTRAVENOUS

## 2019-10-16 MED ORDER — ACETAMINOPHEN 10 MG/ML IV SOLN: 1000.0000 mg | Freq: Once | INTRAVENOUS | Status: DC | PRN

## 2019-10-16 MED ORDER — SUGAMMADEX SODIUM 200 MG/2ML IV SOLN
INTRAVENOUS | Status: DC | PRN
  Administered 2019-10-16: 125 mg via INTRAVENOUS

## 2019-10-16 MED ORDER — AZITHROMYCIN 250 MG PO TABS
1000.0000 mg | ORAL_TABLET | Freq: Once | ORAL | Status: AC
  Administered 2019-10-16: 11:00:00 1000 mg via ORAL
  Filled 2019-10-16: qty 4

## 2019-10-16 MED ORDER — MIDAZOLAM HCL 2 MG/2ML IJ SOLN
INTRAMUSCULAR | Status: DC | PRN
  Administered 2019-10-16 (×2): 1 mg via INTRAVENOUS

## 2019-10-16 MED ORDER — ONDANSETRON HCL 4 MG/2ML IJ SOLN
INTRAMUSCULAR | Status: DC | PRN
  Administered 2019-10-16: 4 mg via INTRAVENOUS

## 2019-10-16 MED ORDER — KETOROLAC TROMETHAMINE 30 MG/ML IJ SOLN: 30.0000 mg | Freq: Once | INTRAMUSCULAR | Status: DC

## 2019-10-16 MED ORDER — SCOPOLAMINE 1 MG/3DAYS TD PT72
MEDICATED_PATCH | TRANSDERMAL | Status: AC
  Filled 2019-10-16: qty 1

## 2019-10-16 MED ORDER — IBUPROFEN 600 MG PO TABS: 600.0000 mg | ORAL_TABLET | Freq: Four times a day (QID) | ORAL | 0 refills | Status: DC | PRN

## 2019-10-16 MED ORDER — PROMETHAZINE HCL 25 MG/ML IJ SOLN: 6.2500 mg | INTRAMUSCULAR | Status: DC | PRN

## 2019-10-16 MED ORDER — STERILE WATER FOR INJECTION IJ SOLN
INTRAMUSCULAR | Status: AC
  Filled 2019-10-16: qty 10

## 2019-10-16 MED ORDER — PHENYLEPHRINE 40 MCG/ML (10ML) SYRINGE FOR IV PUSH (FOR BLOOD PRESSURE SUPPORT)
PREFILLED_SYRINGE | INTRAVENOUS | Status: AC
  Filled 2019-10-16: qty 10

## 2019-10-16 MED ORDER — CEFTRIAXONE SODIUM 250 MG IJ SOLR
250.0000 mg | Freq: Once | INTRAMUSCULAR | Status: AC
  Administered 2019-10-16: 11:00:00 250 mg via INTRAMUSCULAR
  Filled 2019-10-16: qty 250

## 2019-10-16 MED ORDER — SCOPOLAMINE 1 MG/3DAYS TD PT72
MEDICATED_PATCH | TRANSDERMAL | Status: DC | PRN
  Administered 2019-10-16: 1 via TRANSDERMAL

## 2019-10-16 MED ORDER — METHYLERGONOVINE MALEATE 0.2 MG/ML IJ SOLN
INTRAMUSCULAR | Status: AC
  Filled 2019-10-16: qty 1

## 2019-10-16 MED ORDER — BUPIVACAINE HCL (PF) 0.25 % IJ SOLN
INTRAMUSCULAR | Status: DC | PRN
  Administered 2019-10-16: 30 mL

## 2019-10-16 MED ORDER — OXYCODONE HCL 5 MG PO TABS: 5.0000 mg | ORAL_TABLET | Freq: Once | ORAL | Status: DC | PRN

## 2019-10-16 MED ORDER — ROCURONIUM BROMIDE 10 MG/ML (PF) SYRINGE
PREFILLED_SYRINGE | INTRAVENOUS | Status: DC | PRN
  Administered 2019-10-16: 30 mg via INTRAVENOUS

## 2019-10-16 MED ORDER — METRONIDAZOLE 500 MG PO TABS
2000.0000 mg | ORAL_TABLET | Freq: Once | ORAL | Status: AC
  Administered 2019-10-16: 11:00:00 2000 mg via ORAL
  Filled 2019-10-16: qty 4

## 2019-10-16 SURGICAL SUPPLY — 35 items
ADH SKN CLS APL DERMABOND .7 (GAUZE/BANDAGES/DRESSINGS) ×1
APL PRP STRL LF DISP 70% ISPRP (MISCELLANEOUS) ×1
BAG SPEC RTRVL LRG 6X4 10 (ENDOMECHANICALS) ×1
CABLE HIGH FREQUENCY MONO STRZ (ELECTRODE) IMPLANT
CHLORAPREP W/TINT 26 (MISCELLANEOUS) ×3 IMPLANT
DERMABOND ADVANCED (GAUZE/BANDAGES/DRESSINGS) ×2
DERMABOND ADVANCED .7 DNX12 (GAUZE/BANDAGES/DRESSINGS) IMPLANT
DRSG OPSITE POSTOP 4X6 (GAUZE/BANDAGES/DRESSINGS) IMPLANT
FORCEPS CUTTING 33CM 5MM (CUTTING FORCEPS) IMPLANT
FORCEPS CUTTING 45CM 5MM (CUTTING FORCEPS) IMPLANT
GLOVE BIOGEL PI IND STRL 7.0 (GLOVE) ×2 IMPLANT
GLOVE BIOGEL PI IND STRL 8.5 (GLOVE) ×1 IMPLANT
GLOVE BIOGEL PI INDICATOR 7.0 (GLOVE) ×4
GLOVE BIOGEL PI INDICATOR 8.5 (GLOVE) ×2
GLOVE ECLIPSE 8.0 STRL XLNG CF (GLOVE) ×6 IMPLANT
GOWN STRL REUS W/TWL LRG LVL3 (GOWN DISPOSABLE) ×6 IMPLANT
KIT TURNOVER KIT A (KITS) IMPLANT
LIGASURE VESSEL 5MM BLUNT TIP (ELECTROSURGICAL) ×2 IMPLANT
NS IRRIG 1000ML POUR BTL (IV SOLUTION) ×3 IMPLANT
PACK LAPAROSCOPY BASIN (CUSTOM PROCEDURE TRAY) ×3 IMPLANT
POUCH SPECIMEN RETRIEVAL 10MM (ENDOMECHANICALS) ×2 IMPLANT
PROTECTOR NERVE ULNAR (MISCELLANEOUS) ×6 IMPLANT
SET IRRIG TUBING LAPAROSCOPIC (IRRIGATION / IRRIGATOR) IMPLANT
SET TUBE SMOKE EVAC HIGH FLOW (TUBING) ×3 IMPLANT
SHEARS HARMONIC ACE PLUS 36CM (ENDOMECHANICALS) IMPLANT
SLEEVE XCEL OPT CAN 5 100 (ENDOMECHANICALS) IMPLANT
SUT MNCRL AB 3-0 PS2 27 (SUTURE) ×3 IMPLANT
SUT VIC AB 2-0 UR6 27 (SUTURE) IMPLANT
SUT VICRYL 0 ENDOLOOP (SUTURE) IMPLANT
SYR 50ML LL SCALE MARK (SYRINGE) IMPLANT
TOWEL OR 17X26 10 PK STRL BLUE (TOWEL DISPOSABLE) ×6 IMPLANT
TRAY FOLEY MTR SLVR 14FR STAT (SET/KITS/TRAYS/PACK) ×3 IMPLANT
TROCAR BALLN 12MMX100 BLUNT (TROCAR) IMPLANT
TROCAR XCEL NON-BLD 11X100MML (ENDOMECHANICALS) IMPLANT
TROCAR XCEL NON-BLD 5MMX100MML (ENDOMECHANICALS) ×3 IMPLANT

## 2019-10-16 NOTE — Progress Notes (Signed)
Waiting for friend to arrive to drive pt home, will cont to monitor and observe, comfort measures provided.

## 2019-10-16 NOTE — Discharge Instructions (Addendum)
Laparoscopic Surgery Discharge Instructions  Instructions Following Laparoscopic Surgery You have just undergone a laparoscopic surgery.  The following list should answer your most common questions.  Although we will discuss your surgery and post-operative instructions with you prior to your discharge, this list will serve as a reminder if you fail to recall the details of what we discussed.  We will discuss your surgery once again in detail at your post-op visit in two to four weeks. If you haven't already done so, please call to make your appointment as soon as possible.  How you will feel: Although you have just undergone a major surgery, your recovery will be significantly shorter since the surgery was performed through much smaller incisions than the traditional approach.  You should feel slightly better each day.  If you suddenly feel much worse than the prior day, please call the clinic.  It's important during the early part of your recovery that you maintain some activity.  Walking is encouraged.  You will quicken your recovery by continued activity.  Incision:  Your incisions will be closed with dissolvable stitches or surgical adhesive (glue).  There may be Band-aids and/or Steri-strips covering your incisions.  If there is no drainage from the incisions you may remove the Band-aids in one to two days.  You may notice some minor bruising at the incision sites.  This is common and will resolve within several days.  Please inform us if the redness at the edges of your incision appears to be spreading.  If the skin around your incision becomes warm to the touch, or if you notice a pus-like drainage, please call the office.  Stairs/Driving/Activities: You may climb stairs if necessary.  If you've had general anesthesia, do not drive a car the rest of the day today.  You may begin light housework when you feel up to it, but avoid heavy lifting (more than 15-20lbs) or pushing until cleared for these  activities by your physician.  Hygiene:  Do not soak your incisions.  Showers are acceptable but you may not take a bath or swim in a pool.  Cleanse your incisions daily with soap and water.  Medications:  Please resume taking any medications that you were taking prior to the surgery.  If we have prescribed any new medications for you, please take them as directed.  Constipation:  It is fairly common to experience some difficulty in moving your bowels following major surgery.  Being active will help to reduce this likelihood. A diet rich in fiber and plenty of liquids is desirable.  If you do become constipated, a mild laxative such as Miralax, Milk of Magnesia, or Metamucil, or a stool softener such as Colace, is recommended.  General Instructions: If you develop a fever of 100.5 degrees or higher, please call the office number(s) below for physician on call.    

## 2019-10-16 NOTE — Anesthesia Postprocedure Evaluation (Signed)
Anesthesia Post Note  Patient: Amanda Russell  Procedure(s) Performed: LAPAROSCOPIC LEFT SALPINGECTOMY (Left Abdomen)     Patient location during evaluation: PACU Anesthesia Type: General Level of consciousness: awake and alert Pain management: pain level controlled Vital Signs Assessment: post-procedure vital signs reviewed and stable Respiratory status: spontaneous breathing, nonlabored ventilation and respiratory function stable Cardiovascular status: blood pressure returned to baseline and stable Postop Assessment: no apparent nausea or vomiting Anesthetic complications: no    Last Vitals:  Vitals:   10/16/19 1516 10/16/19 1517  BP: 107/89 107/89  Pulse: 93 93  Resp: (!) 21 (!) 21  Temp: 36.7 C   SpO2: 100% 100%    Last Pain:  Vitals:   10/16/19 1530  TempSrc:   PainSc: 0-No pain                 Beryle Lathe

## 2019-10-16 NOTE — ED Notes (Signed)
Short stay reports patient will go to surgery approximately 1400

## 2019-10-16 NOTE — Op Note (Addendum)
Operative Note   10/16/2019  PRE-OP DIAGNOSIS: Left fallopian tube pregnancy   POST-OP DIAGNOSIS: Same. Right corpus luteal cyst.    SURGEON: Surgeon(s) and Role:    * [place] Bing, MD - Primary  ASSISTANT: None  ANESTHESIA: General and local  PROCEDURE: LAPAROSCOPIC LEFT SALPINGECTOMY   ESTIMATED BLOOD LOSS: of hemoperitoneum. 57mL for the case  DRAINS: Indwelling foley UOP per anesthesia note   TOTAL IV FLUIDS: per anesthesia note  SPECIMENS: left fallopian tube   VTE PROPHYLAXIS: SCDs to the bilateral lower extremities  ANTIBIOTICS: not indicated  COMPLICATIONS: NOne  DISPOSITION: PACU - hemodynamically stable.  CONDITION: stable  FINDINGS: EGBUS, vagina and cervix normal. Patient sounded to 8.5cm. On laparoscopy, grossly normal uterus, right tube, ovaries (right 3cm corpus luteal cyst), simple appearing small benign appearing paratubal cysts, liver, and stomach edge; no intra-abdominal adhesions were noted Left tube dilated with clot being extruded through the fallopian tube with of hemoperitoneum already present.   PROCEDURE IN DETAIL: The patient was taken to the OR where anesthesia was administed. The patient was positioned in dorsal lithotomy in the [place] stirrups. The patient was then examined under anesthesia with the above noted findings. The patient was prepped and draped in the normal sterile fashion and foley catheter was placed. A Graves speculum was placed in the vagina and the anterior lip of the cervix was grasped with a single toothed tenaculum.  A Hulka uterine manipulator was then inserted in the uterus and uterine mobility was found to be satisfactory; the speculum and tenaculum were then removed.  After changing gloves, attention was turned to the patient's abdomen where a 10 mm skin incision was made in the umbilical fold, after injection of local anesthesia. The balloon trocar was placed via the open technique and pneumoperitoneum was  obtained. The 55mm port was then placed through the sleeve and the operative laparoscope was introduced into the abdomen with the above noted findings, after inspection of the entry site and then placing the patient in Trendelenburg with the above noted findings. A 57mm LLQ and suprapubic port were then placed after injection of local anesthesia and under direct visualization.   The left tube was identified and the meso-salpingx was serially cauterized and cut to the cornua of the uterus, being careful to incorporate the 3-4, 60mm benign paratubal cysts that were present. An endocatch bag was introduced and the specimen removed. On the right adnexa was one 50mm paratubal cyst that was removed with the ligasure and sent with the other specimens.   The pressure was lowered to and all operative sites were hemostatic.   All instruments and ports were then removed from the abdomen under direct visualization. The fascia at the umbilical incision was reapproximated with 0 vicryl. The skin was closed with 4-0 monocryl and the other skin incisions closed with dermabond. The Hulka was removed with no bleeding noted from the cervix and all other instrumentation was removed from the vagina.  The foley catheter was removed. The patient tolerated the procedure well. All counts were correct x 2. The patient was transferred to the recovery room awake, alert and breathing independently.   Cornelia Copa MD Attending [facility] for [employer] [facility])

## 2019-10-16 NOTE — Anesthesia Preprocedure Evaluation (Addendum)
Anesthesia Evaluation  Patient identified by MRN, date of birth, ID band Patient awake    Reviewed: Allergy & Precautions, NPO status , Patient's Chart, lab work & pertinent test results  Airway Mallampati: II  TM Distance: >3 FB Neck ROM: Full    Dental no notable dental hx.    Pulmonary Patient abstained from smoking., former smoker,    Pulmonary exam normal breath sounds clear to auscultation       Cardiovascular negative cardio ROS Normal cardiovascular exam Rhythm:Regular Rate:Normal     Neuro/Psych PSYCHIATRIC DISORDERS Anxiety Depression Schizophrenia ADHD (attention deficit hyperactivity disorder) Oppositional defiant disorder  negative neurological ROS     GI/Hepatic (+)     substance abuse  marijuana use, N/V   Endo/Other  negative endocrine ROS  Renal/GU negative Renal ROS     Musculoskeletal negative musculoskeletal ROS (+)   Abdominal   Peds  Hematology negative hematology ROS (+)   Anesthesia Other Findings ectopic pregnancy  Reproductive/Obstetrics                            Anesthesia Physical Anesthesia Plan  ASA: II and emergent  Anesthesia Plan: General   Post-op Pain Management:    Induction: Intravenous and Rapid sequence  PONV Risk Score and Plan: 4 or greater and Ondansetron, Dexamethasone, Midazolam, Treatment may vary due to age or medical condition and Scopolamine patch - Pre-op  Airway Management Planned: Oral ETT  Additional Equipment:   Intra-op Plan:   Post-operative Plan: Extubation in OR  Informed Consent: I have reviewed the patients History and Physical, chart, labs and discussed the procedure including the risks, benefits and alternatives for the proposed anesthesia with the patient or authorized representative who has indicated his/her understanding and acceptance.     Dental advisory given  Plan Discussed with: CRNA  Anesthesia  Plan Comments: (Left anterior ear piercing)       Anesthesia Quick Evaluation

## 2019-10-16 NOTE — ED Provider Notes (Signed)
[facility]-EMERGENCY DEPT Provider Note   CSN: [id number] Arrival date & time: 10/15/19  2229     History No chief complaint on file.   [name] is a 24 y.o. female with a past medical history of ADHD presenting to the ED for lower abdominal pain, nausea, vomiting since yesterday.  States that 2 days ago she ate at [employer] and is unsure if this is the cause of her symptoms.  She has been taking Tylenol with some improvement in her symptoms.  Complains of body aches, chills and decreased appetite as well.  No sick contacts with similar symptoms.  She denies any abnormal vaginal bleeding, vaginal discharge.  She is sexually active without protection with 1 female partner who is her boyfriend.  She is not on any birth control.  HPI     Past Medical History:  Diagnosis Date  . ADHD (attention deficit hyperactivity disorder), combined type 07/14/2012  . Chlamydia   . Preterm premature rupture of membranes (PPROM) with unknown onset of labor 07/13/2014  . Suicidal ideation     Patient Active Problem List   Diagnosis Date Noted  . Left tubal pregnancy 10/16/2019  . Acute psychosis (HCC) 03/15/2019  . 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 Suicidal ideation 07/14/2012  . Depression 07/14/2012  . ADHD (attention deficit hyperactivity disorder), combined type 07/14/2012  . Oppositional defiant disorder 07/14/2012  . Parent-child relational problem 07/14/2012    Past Surgical History:  Procedure Laterality Date  . NO PAST SURGERIES       OB History    Gravida  3   Para  2   Term  1   Preterm  1   AB      Living  2     SAB      TAB      Ectopic      Multiple      Live Births  2           Family History  Problem Relation Age of Onset  . Hypertension Other   . Cancer Neg Hx   . Diabetes Neg Hx     Social History   Tobacco Use  . Smoking status: Former Smoker    Packs/day: 0.50   Years: 0.80    Pack years: 0.40    Types: Cigarettes    Quit date: 01/07/2011    Years since quitting: 8.7  . Smokeless tobacco: Never Used  Substance Use Topics  . Alcohol use: Yes    Comment: socially  . Drug use: Not Currently    Frequency: 7.0 times per week    Types: Marijuana    Comment: denies    Home Medications Prior to Admission medications   Medication Sig Start Date End Date Taking? Authorizing Provider  acetaminophen (TYLENOL) 500 MG tablet Take 500-1,000 mg by mouth every 6 (six) hours as needed (for headaches or pain).   Yes [provider]  metroNIDAZOLE (FLAGYL) 500 MG tablet Take 1 tablet (500 mg total) by mouth 2 (two) times daily. Patient not taking: Reported on 10/16/2019 04/27/19   Dahlia ByesBast, Traci A, NP  omeprazole (PRILOSEC) 20 MG capsule Take 1 capsule (20 mg total) by mouth daily. Patient not taking: Reported on 10/16/2019 04/11/19   Belinda FisherYu, Amy V, PA-C  triamcinolone cream (KENALOG) 0.1 % Apply 1 application topically 2 (two) times daily. Patient not taking: Reported on 10/16/2019 04/08/19   Jeanie SewerFawze, Mina A, PA-C  Allergies    Pollen extract  Review of Systems   Review of Systems  Constitutional: Positive for chills. Negative for appetite change and fever.  HENT: Negative for ear pain, rhinorrhea, sneezing and sore throat.   Eyes: Negative for photophobia and visual disturbance.  Respiratory: Negative for cough, chest tightness, shortness of breath and wheezing.   Cardiovascular: Negative for chest pain and palpitations.  Gastrointestinal: Positive for abdominal pain, nausea and vomiting. Negative for blood in stool, constipation and diarrhea.  Genitourinary: Negative for dysuria, hematuria and urgency.  Musculoskeletal: Positive for myalgias.  Skin: Negative for rash.  Neurological: Negative for dizziness, weakness and light-headedness.    Physical Exam Updated Vital Signs BP (!) 93/55 (BP Location: Right Arm)   Pulse (!) 57   Temp 98.5 F (36.9 C)  (Oral)   Resp 19   LMP  (LMP Unknown)   SpO2 100%   Physical Exam Vitals and nursing note reviewed.  Constitutional:      General: She is not in acute distress.    Appearance: She is well-developed.  HENT:     Head: Normocephalic and atraumatic.     Nose: Nose normal.  Eyes:     General: No scleral icterus.       Right eye: No discharge.        Left eye: No discharge.     Conjunctiva/sclera: Conjunctivae normal.  Cardiovascular:     Rate and Rhythm: Normal rate and regular rhythm.     Heart sounds: Normal heart sounds. No murmur. No friction rub. No gallop.   Pulmonary:     Effort: Pulmonary effort is normal. No respiratory distress.     Breath sounds: Normal breath sounds.  Abdominal:     General: Bowel sounds are normal. There is no distension.     Palpations: Abdomen is soft.     Tenderness: There is abdominal tenderness (Left lower quadrant). There is no guarding.  Genitourinary:    Vagina: Vaginal discharge present.     Cervix: No cervical motion tenderness.     Adnexa:        Right: No tenderness.         Left: No tenderness.       Comments: Pelvic exam: normal external genitalia without evidence of trauma. VULVA: normal appearing vulva with no masses, tenderness or lesion. VAGINA: normal appearing vagina with normal color and thick white discharge, no lesions. CERVIX: normal appearing cervix without lesions, cervical motion tenderness absent, cervical os closed with out purulent discharge; No vaginal discharge. Wet prep and DNA probe for chlamydia and GC obtained.   ADNEXA: normal adnexa in size, nontender and no masses UTERUS: uterus is normal size, shape, consistency and nontender.   Musculoskeletal:        General: Normal range of motion.     Cervical back: Normal range of motion and neck supple.  Skin:    General: Skin is warm and dry.     Findings: No rash.  Neurological:     Mental Status: She is alert.     Motor: No abnormal muscle tone.      Coordination: Coordination normal.     ED Results / Procedures / Treatments   Labs (all labs ordered are listed, but only abnormal results are displayed) Labs Reviewed  WET PREP, GENITAL - Abnormal; Notable for the following components:      Result Value   Trich, Wet Prep PRESENT (*)    WBC, Wet Prep HPF POC MANY (*)  All other components within normal limits  CBC WITH DIFFERENTIAL/PLATELET - Abnormal; Notable for the following components:   WBC 12.5 (*)    HCT 35.7 (*)    Neutro Abs 10.8 (*)    All other components within normal limits  COMPREHENSIVE METABOLIC PANEL - Abnormal; Notable for the following components:   Glucose, Bld 115 (*)    Total Protein 8.3 (*)    Total Bilirubin 1.3 (*)    All other components within normal limits  URINALYSIS, ROUTINE W REFLEX MICROSCOPIC - Abnormal; Notable for the following components:   Specific Gravity, Urine 1.032 (*)    Ketones, ur 20 (*)    Protein, ur 100 (*)    Bacteria, UA RARE (*)    All other components within normal limits  HCG, QUANTITATIVE, PREGNANCY - Abnormal; Notable for the following components:   hCG, Beta Chain, Quant, S 5,698 (*)    All other components within normal limits  I-STAT BETA HCG BLOOD, ED ([facility], [facility], [facility] ONLY) - Abnormal; Notable for the following components:   I-stat hCG, quantitative >2,000.0 (*)    All other components within normal limits  RESPIRATORY PANEL BY RT PCR (FLU A&B, COVID)  URINE CULTURE  LIPASE, BLOOD  TYPE AND SCREEN  ABO/RH  GC/CHLAMYDIA PROBE AMP ([place]) NOT AT [facility]    EKG None  Radiology US OB Comp < 14 Wks  Result Date: 10/16/2019 CLINICAL DATA:  Pelvic pain with nausea EXAM: OBSTETRIC <14 WK [place] AND TRANSVAGINAL OB US TECHNIQUE: Both transabdominal and transvaginal ultrasound examinations were performed for complete evaluation of the gestation as well as the maternal uterus, adnexal regions, and pelvic cul-de-sac. Transvaginal technique was performed to assess early  pregnancy. COMPARISON:  None. FINDINGS: Intrauterine gestational sac: Not visualized Yolk sac: Visualized in left adnexa Embryo:  Visualized in left adnexa Cardiac Activity: Visualized in left adnexa Heart Rate: 177 bpm CRL:  4 mm   6 w   1 d Subchorionic hemorrhage:  None visualized. Maternal uterus/adnexae: No intrauterine lesion is evident. Cervical os is closed. There is a complex mass in the left adnexa measuring 2.6 x 3.9 x 2.5 cm with adjacent free fluid. Corpus luteum in right ovary containing debris. IMPRESSION: Live ectopic gestation in the left adnexal region with estimated gestational age of [redacted] weeks. No intrauterine gestation. Debris in right corpus luteum. Critical Value/emergent results were called by telephone at the time of interpretation on 10/16/2019 at 9:52 am to [place] , who verbally acknowledged these results. Electronically Signed   By: Lowella Grip III M.D.   On: 10/16/2019 09:52   US OB Transvaginal  Result Date: 10/16/2019 CLINICAL DATA:  Pelvic pain with nausea EXAM: OBSTETRIC <14 WK [place] AND TRANSVAGINAL OB US TECHNIQUE: Both transabdominal and transvaginal ultrasound examinations were performed for complete evaluation of the gestation as well as the maternal uterus, adnexal regions, and pelvic cul-de-sac. Transvaginal technique was performed to assess early pregnancy. COMPARISON:  None. FINDINGS: Intrauterine gestational sac: Not visualized Yolk sac: Visualized in left adnexa Embryo:  Visualized in left adnexa Cardiac Activity: Visualized in left adnexa Heart Rate: 177 bpm CRL:  4 mm   6 w   1 d Subchorionic hemorrhage:  None visualized. Maternal uterus/adnexae: No intrauterine lesion is evident. Cervical os is closed. There is a complex mass in the left adnexa measuring 2.6 x 3.9 x 2.5 cm with adjacent free fluid. Corpus luteum in right ovary containing debris. IMPRESSION: Live ectopic gestation in the left adnexal region with  estimated gestational age of [redacted] weeks. No  intrauterine gestation. Debris in right corpus luteum. Critical Value/emergent results were called by telephone at the time of interpretation on 10/16/2019 at 9:52 am to [facility] , who verbally acknowledged these results. Electronically Signed   By: Bretta Bang III M.D.   On: 10/16/2019 09:52    Procedures .Critical Care Performed by: Dietrich Pates, PA-C Authorized by: Dietrich Pates, PA-C   Critical care provider statement:    Critical care time (minutes):  35   Critical care time was exclusive of:  Separately billable procedures and treating other patients   Critical care was necessary to treat or prevent imminent or life-threatening deterioration of the following conditions:  Cardiac failure, CNS failure or compromise, shock and circulatory failure   Critical care was time spent personally by me on the following activities:  Development of treatment plan with patient or surrogate, discussions with consultants, evaluation of patient's response to treatment, examination of patient, ordering and performing treatments and interventions, ordering and review of laboratory studies, ordering and review of radiographic studies, re-evaluation of patient's condition, review of old charts and obtaining history from patient or surrogate   I assumed direction of critical care for this patient from another provider in my specialty: no     (including critical care time)  Medications Ordered in ED Medications  metroNIDAZOLE (FLAGYL) tablet 2,000 mg (has no administration in time range)  cefTRIAXone (ROCEPHIN) injection 250 mg (has no administration in time range)  azithromycin (ZITHROMAX) tablet 1,000 mg (has no administration in time range)  sodium chloride 0.9 % bolus 1,000 mL (1,000 mLs Intravenous New Bag/Given 10/16/19 0818)    ED Course  I have reviewed the triage vital signs and the nursing notes.  Pertinent labs & imaging results that were available during my care of the patient  were reviewed by me and considered in my medical decision making (see chart for details).  24 year old female presents to ED for lower abdominal pain, nausea, vomiting, body aches and chills.  Lab work shows positive hCG.  This is her third pregnancy.  She is having left lower quadrant tenderness to palpation so we will need to obtain pelvic exam and ultrasound to rule out ectopic pregnancy.  She denies history of ectopic pregnancy.  We will also obtain Covid testing.  She is not concerned about STDs and is declining treatment for gonorrhea and chlamydia until results. Pelvic exam revealed white vaginal discharge without cervical motion tenderness, adnexal tenderness or uterine tenderness.  Clinical Course as of Oct 15 1050  Fri Oct 16, 2019  0981 Radiologist read as left-sided ectopic pregnancy.   [HK]  1014 Dr. Vergie Living, OB/GYN to see patient.  Covid swab is pending.   [HK]  1015 Patient informed of her wet prep results.  Will treat with Flagyl, Rocephin and azithromycin.   [HK]  1015 Covid test is negative.   [HK]    Clinical Course User Index [HK] Dietrich Pates, PA-C   MDM Rules/Calculators/A&P                      Amanda Russell was evaluated in Emergency Department on 10/16/19 for the symptoms described in the history of present illness. He/she was evaluated in the context of the global COVID-19 pandemic, which necessitated consideration that the patient might be at risk for infection with the SARS-CoV-2 virus that causes COVID-19. Institutional protocols and algorithms that pertain to the evaluation of patients at risk for COVID-19  are in a state of rapid change based on information released by regulatory bodies including the CDC and federal and state organizations. These policies and algorithms were followed during the patient's care in the ED.  Final Clinical Impression(s) / ED Diagnoses Final diagnoses:  Left tubal pregnancy without intrauterine pregnancy    Rx / DC Orders ED  Discharge Orders    None       Dietrich Pates, PA-C 10/16/19 1052    Lorre Nick, MD 10/19/19 1528

## 2019-10-16 NOTE — Progress Notes (Signed)
PACU NURSING NOTE: Phase II - alert and oriented, mae x 4, vss, no complaints of n/v, pain at minimal. Able to tol PO fluids in Phase II, voided w/o difficulty, surgical sites WNL. DC instructions reviewed w/ pt. Explained importance of safety while taking PO pain med, importance of making follow up MD appt as per MD orders. Discussed S&S of infection also when to call MD, I.e. pain unrelieved. S&S of infection, uncontrolled N/V etc. Pt given copy of AVS and Post General Anesthesia Care sheet. Opportunity for questions provided. Pt escorted to exit via wheelchair

## 2019-10-16 NOTE — H&P (Signed)
Obstetrics & Gynecology H&P   Date of Admission: 10/16/2019   Requesting Provider: [facility]  Primary OBGYN: None Primary Care Provider: Armond Hang  Reason for Admission: left tubal pregnancy with heart beat  History of Present Illness: Amanda Russell is a 24 y.o. [id number] (No LMP recorded (lmp unknown).), with the above CC. PMHx is significant for STD.  Patient with two day long h/o HA, nausea, vomiting and mild cramping so came to ED. Patient dx with trich and left sided tubal pregnancy at 6wks with heartbeat, no free fluid and normal CBC. NPO since before midnight.     ROS: A 12-point review of systems was performed and negative, except as stated in the above HPI.  OBGYN History: As per HPI. OB History  Gravida Para Term Preterm AB Living  2 2 1 1   2   SAB TAB Ectopic Multiple Live Births          2    # Outcome Date GA Lbr Len/2nd Weight Sex Delivery Anes PTL Lv  2 Preterm 07/13/14 [redacted]w[redacted]d 11:31 / 00:12 1281 g M Vag-Spont None  LIV  1 Term 10/13/11 [redacted]w[redacted]d 21:02 / 02:19 3290 g M Vag-Spont EPI  LIV     Birth Comments: caput     Past Medical History: Past Medical History:  Diagnosis Date  . ADHD (attention deficit hyperactivity disorder), combined type 07/14/2012  . Chlamydia   . Suicidal ideation     Past Surgical History: Past Surgical History:  Procedure Laterality Date  . NO PAST SURGERIES      Family History:  Family History  Problem Relation Age of Onset  . Hypertension Other   . Cancer Neg Hx   . Diabetes Neg Hx     Social History:  Social History   Socioeconomic History  . Marital status: Single    Spouse name: Not on file  . Number of children: Not on file  . Years of education: Not on file  . Highest education level: Not on file  Occupational History  . 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 Smoking status: Former Smoker    Packs/day: 0.50    Years: 0.80    Pack years: 0.40    Types: Cigarettes    Quit  date: 01/07/2011    Years since quitting: 8.7  . Smokeless tobacco: Never Used  Substance and Sexual Activity  . Alcohol use: Yes    Comment: socially  . Drug use: Not Currently    Frequency: 7.0 times per week    Types: Marijuana    Comment: denies  . Sexual activity: Yes    Partners: Male    Birth control/protection: Pill, None    Comment: Patient states her last time taking BCP was last year  Other Topics Concern  . Not on file  Social History Narrative  . Not on file   Social Determinants of Health   Financial Resource Strain:   . Difficulty of Paying Living Expenses: Not on file  Food Insecurity:   . Worried About 03/09/2011 in the Last Year: Not on file  . Ran Out of Food in the Last Year: Not on file  Transportation Needs:   . Lack of Transportation (Medical): Not on file  . Lack of Transportation (Non-Medical): Not on file  Physical Activity:   . Days of Exercise per Week: Not on file  . Minutes of Exercise per Session: Not  on file  Stress:   . Feeling of Stress : Not on file  Social Connections:   . Frequency of Communication with Friends and Family: Not on file  . Frequency of Social Gatherings with Friends and Family: Not on file  . Attends Religious Services: Not on file  . Active Member of Clubs or Organizations: Not on file  . Attends Banker Meetings: Not on file  . Marital Status: Not on file  Intimate Partner Violence:   . Fear of Current or Ex-Partner: Not on file  . Emotionally Abused: Not on file  . Physically Abused: Not on file  . Sexually Abused: Not on file    Allergy: Allergies  Allergen Reactions  . Pollen Extract Itching and Cough    Current Outpatient Medications: (Not in a hospital admission)    Hospital Medications: Current Facility-Administered Medications  Medication Dose Route Frequency Provider Last Rate Last Admin  . azithromycin (ZITHROMAX) tablet 1,000 mg  1,000 mg Oral Once Khatri, Hina, PA-C       . cefTRIAXone (ROCEPHIN) injection 250 mg  250 mg Intramuscular Once Khatri, Hina, PA-C      . metroNIDAZOLE (FLAGYL) tablet 2,000 mg  2,000 mg Oral Once Khatri, Hina, PA-C       Current Outpatient Medications  Medication Sig Dispense Refill  . acetaminophen (TYLENOL) 500 MG tablet Take 500-1,000 mg by mouth every 6 (six) hours as needed (for headaches or pain).    . metroNIDAZOLE (FLAGYL) 500 MG tablet Take 1 tablet (500 mg total) by mouth 2 (two) times daily. (Patient not taking: Reported on 10/16/2019) 14 tablet 0  . omeprazole (PRILOSEC) 20 MG capsule Take 1 capsule (20 mg total) by mouth daily. (Patient not taking: Reported on 10/16/2019) 15 capsule 0  . triamcinolone cream (KENALOG) 0.1 % Apply 1 application topically 2 (two) times daily. (Patient not taking: Reported on 10/16/2019) 30 g 0     Physical Exam:   Current Vital Signs 24h Vital Sign Ranges  T 98.5 F (36.9 C) Temp  Avg: 98.5 F (36.9 C)  Min: 98.5 F (36.9 C)  Max: 98.5 F (36.9 C)  BP (!) 93/55 BP  Min: 93/55  Max: 103/83  HR (!) 57 Pulse  Avg: 72  Min: 57  Max: 87  RR 19 Resp  Avg: 19.5  Min: 19  Max: 20  SaO2 100 % Room Air SpO2  Avg: 100 %  Min: 100 %  Max: 100 %       24 Hour I/O Current Shift I/O  Time Ins Outs No intake/output data recorded. No intake/output data recorded.   Patient Vitals for the past 24 hrs:  BP Temp Temp src Pulse Resp SpO2  10/16/19 0820 (!) 93/55 -- -- (!) 57 19 100 %  10/15/19 2236 103/83 98.5 F (36.9 C) Oral 87 20 100 %    There is no height or weight on file to calculate BMI. General appearance: Well nourished, well developed female in no acute distress.  Cardiovascular: S1, S2 normal, no murmur, rub or gallop, regular rate and rhythm Respiratory:  Clear to auscultation bilateral. Normal respiratory effort Abdomen: positive bowel sounds and no masses, hernias; diffusely non tender to palpation, non distended Neuro/Psych:  Normal mood and affect.  Skin:  Warm and dry.   Extremities: no clubbing, cyanosis, or edema.    Laboratory: Beta HCG: [id number] Pending: type and screen, covid, gc/ct, ucx Negative: lipase  Recent Labs  Lab 10/16/19 0132  WBC 12.5*  HGB 12.2  HCT 35.7*  PLT 232   Recent Labs  Lab 10/16/19 0132  NA 137  K 3.6  CL 107  CO2 22  BUN 11  CREATININE 0.71  CALCIUM 9.5  PROT 8.3*  BILITOT 1.3*  ALKPHOS 52  ALT 17  AST 20  GLUCOSE 115*    Imaging:  CLINICAL DATA:  Pelvic pain with nausea  EXAM: OBSTETRIC <14 WK [place] AND TRANSVAGINAL OB US  TECHNIQUE: Both transabdominal and transvaginal ultrasound examinations were performed for complete evaluation of the gestation as well as the maternal uterus, adnexal regions, and pelvic cul-de-sac. Transvaginal technique was performed to assess early pregnancy.  COMPARISON:  None.  FINDINGS: Intrauterine gestational sac: Not visualized  Yolk sac: Visualized in left adnexa  Embryo:  Visualized in left adnexa  Cardiac Activity: Visualized in left adnexa  Heart Rate: 177 bpm  CRL:  4 mm   6 w   1 d  Subchorionic hemorrhage:  None visualized.  Maternal uterus/adnexae: No intrauterine lesion is evident. Cervical os is closed. There is a complex mass in the left adnexa measuring 2.6 x 3.9 x 2.5 cm with adjacent free fluid. Corpus luteum in right ovary containing debris.  IMPRESSION: Live ectopic gestation in the left adnexal region with estimated gestational age of [redacted] weeks. No intrauterine gestation.  Debris in right corpus luteum.  Critical Value/emergent results were called by telephone at the time of interpretation on 10/16/2019 at 9:52 am to [place] , who verbally acknowledged these results.   Electronically Signed   By: Lowella Grip III M.D.   On: 10/16/2019 09:52  Assessment: Ms. Gauger is a 24 y.o. [phone] with stable left sided ectopic Plan: Given +heartbeat I told her I recommend surgery with laparoscopy and removal of  left tube. Pt is amenable to plan. Can proceed to OR when labs are back and OR is ready.   Durene Romans MD Attending Center for [facility] (Faculty Practice) [phone]

## 2019-10-16 NOTE — Transfer of Care (Signed)
Immediate Anesthesia Transfer of Care Note  Patient: Amanda Russell  Procedure(s) Performed: LAPAROSCOPIC LEFT SALPINGECTOMY (Left Abdomen)  Patient Location: PACU  Anesthesia Type:General  Level of Consciousness: drowsy, patient cooperative and responds to stimulation  Airway & Oxygen Therapy: Patient Spontanous Breathing and Patient connected to face mask oxygen  Post-op Assessment: Report given to RN and Post -op Vital signs reviewed and stable  Post vital signs: Reviewed and stable  Last Vitals:  Vitals Value Taken Time  BP 107/89 10/16/19 1516  Temp    Pulse 94 10/16/19 1519  Resp 12 10/16/19 1519  SpO2 100 % 10/16/19 1519  Vitals shown include unvalidated device data.  Last Pain:  Vitals:   10/16/19 1329  TempSrc:   PainSc: 0-No pain         Complications: No apparent anesthesia complications

## 2019-10-16 NOTE — Anesthesia Procedure Notes (Signed)
Procedure Name: Intubation Date/Time: 10/16/2019 2:14 PM Performed by: Eben Burow, CRNA Pre-anesthesia Checklist: Patient identified, Emergency Drugs available, Suction available, Patient being monitored and Timeout performed Patient Re-evaluated:Patient Re-evaluated prior to induction Oxygen Delivery Method: Circle system utilized Preoxygenation: Pre-oxygenation with 100% oxygen Induction Type: IV induction and Rapid sequence Laryngoscope Size: Mac and 4 Grade View: Grade I Tube type: Oral Tube size: 7.0 mm Number of attempts: 1 Airway Equipment and Method: Stylet Placement Confirmation: ETT inserted through vocal cords under direct vision,  positive ETCO2 and breath sounds checked- equal and bilateral Secured at: 22 cm Tube secured with: Tape Dental Injury: Teeth and Oropharynx as per pre-operative assessment

## 2019-10-17 LAB — URINE CULTURE

## 2019-10-19 LAB — GC/CHLAMYDIA PROBE AMP (~~LOC~~) NOT AT ARMC
Chlamydia: POSITIVE — AB
Neisseria Gonorrhea: NEGATIVE

## 2019-10-19 LAB — SURGICAL PATHOLOGY

## 2019-10-20 ENCOUNTER — Encounter: Payer: Self-pay | Admitting: Obstetrics and Gynecology

## 2019-10-20 DIAGNOSIS — A749 Chlamydial infection, unspecified: Secondary | ICD-10-CM | POA: Insufficient documentation

## 2019-11-02 ENCOUNTER — Ambulatory Visit: Payer: Self-pay | Admitting: Obstetrics and Gynecology

## 2019-11-02 ENCOUNTER — Encounter: Payer: Self-pay | Admitting: Obstetrics and Gynecology

## 2019-11-02 NOTE — Progress Notes (Signed)
Patient did not keep her GYN post op appointment for 11/02/2019.  Cornelia Copa MD Attending Center for [facility])

## 2020-02-09 ENCOUNTER — Encounter (HOSPITAL_COMMUNITY): Payer: Self-pay | Admitting: Emergency Medicine

## 2020-02-09 ENCOUNTER — Emergency Department (HOSPITAL_COMMUNITY)
Admission: EM | Admit: 2020-02-09 | Discharge: 2020-02-09 | Disposition: A | Discharge status: Home / Self Care | Payer: Medicaid Other | Attending: Emergency Medicine | Admitting: Emergency Medicine

## 2020-02-09 ENCOUNTER — Other Ambulatory Visit: Payer: Self-pay

## 2020-02-09 DIAGNOSIS — O99891 Other specified diseases and conditions complicating pregnancy: Secondary | ICD-10-CM | POA: Insufficient documentation

## 2020-02-09 DIAGNOSIS — Z3A08 8 weeks gestation of pregnancy: Secondary | ICD-10-CM | POA: Insufficient documentation

## 2020-02-09 DIAGNOSIS — Z5321 Procedure and treatment not carried out due to patient leaving prior to being seen by health care provider: Secondary | ICD-10-CM | POA: Insufficient documentation

## 2020-02-09 DIAGNOSIS — M545 Low back pain: Secondary | ICD-10-CM | POA: Insufficient documentation

## 2020-02-09 DIAGNOSIS — M542 Cervicalgia: Secondary | ICD-10-CM | POA: Diagnosis not present
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
## 2020-02-09 NOTE — ED Notes (Signed)
Pt called from triage with no answer and pt not seen in the lobby 

## 2020-02-10 ENCOUNTER — Ambulatory Visit (HOSPITAL_COMMUNITY)
Admission: EM | Admit: 2020-02-10 | Discharge: 2020-02-10 | Disposition: A | Discharge status: Home / Self Care | Payer: Self-pay | Attending: Family Medicine | Admitting: Family Medicine

## 2020-02-10 ENCOUNTER — Other Ambulatory Visit: Payer: Self-pay

## 2020-02-10 ENCOUNTER — Encounter (HOSPITAL_COMMUNITY): Payer: Self-pay

## 2020-02-10 DIAGNOSIS — M542 Cervicalgia: Secondary | ICD-10-CM

## 2020-02-10 DIAGNOSIS — M545 Low back pain, unspecified: Secondary | ICD-10-CM

## 2020-02-10 LAB — POC URINE PREG, ED: Preg Test, Ur: POSITIVE — AB

## 2020-02-10 MED ORDER — CYCLOBENZAPRINE HCL 10 MG PO TABS: 10.0000 mg | ORAL_TABLET | Freq: Two times a day (BID) | ORAL | 0 refills | Status: DC | PRN
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
## 2020-02-10 NOTE — Discharge Instructions (Signed)
Please try heat  Please try flexeril. It can make you sleepy . Please start it at night to begin with  Please try the exercises  Please follow up if your symptoms fail to improve.

## 2020-02-17 ENCOUNTER — Inpatient Hospital Stay (HOSPITAL_COMMUNITY)
Admission: AD | Admit: 2020-02-17 | Discharge: 2020-02-18 | Disposition: A | Discharge status: Home / Self Care | Payer: Medicaid Other | Attending: Obstetrics and Gynecology | Admitting: Obstetrics and Gynecology

## 2020-02-17 ENCOUNTER — Inpatient Hospital Stay (HOSPITAL_COMMUNITY): Payer: Medicaid Other

## 2020-02-17 ENCOUNTER — Encounter (HOSPITAL_COMMUNITY): Payer: Self-pay | Admitting: Obstetrics and Gynecology

## 2020-02-17 ENCOUNTER — Other Ambulatory Visit: Payer: Self-pay

## 2020-02-17 DIAGNOSIS — Z9079 Acquired absence of other genital organ(s): Secondary | ICD-10-CM | POA: Insufficient documentation

## 2020-02-17 DIAGNOSIS — O208 Other hemorrhage in early pregnancy: Secondary | ICD-10-CM | POA: Diagnosis not present

## 2020-02-17 DIAGNOSIS — O26899 Other specified pregnancy related conditions, unspecified trimester: Secondary | ICD-10-CM

## 2020-02-17 DIAGNOSIS — O468X1 Other antepartum hemorrhage, first trimester: Secondary | ICD-10-CM

## 2020-02-17 DIAGNOSIS — Z3A10 10 weeks gestation of pregnancy: Secondary | ICD-10-CM | POA: Diagnosis not present

## 2020-02-17 DIAGNOSIS — O26891 Other specified pregnancy related conditions, first trimester: Secondary | ICD-10-CM | POA: Diagnosis not present

## 2020-02-17 DIAGNOSIS — R102 Pelvic and perineal pain: Secondary | ICD-10-CM | POA: Diagnosis not present

## 2020-02-17 DIAGNOSIS — Z87891 Personal history of nicotine dependence: Secondary | ICD-10-CM | POA: Diagnosis not present

## 2020-02-17 DIAGNOSIS — O209 Hemorrhage in early pregnancy, unspecified: Secondary | ICD-10-CM

## 2020-02-17 DIAGNOSIS — O0931 Supervision of pregnancy with insufficient antenatal care, first trimester: Secondary | ICD-10-CM | POA: Diagnosis not present

## 2020-02-17 DIAGNOSIS — O09291 Supervision of pregnancy with other poor reproductive or obstetric history, first trimester: Secondary | ICD-10-CM | POA: Insufficient documentation

## 2020-02-17 DIAGNOSIS — Z8759 Personal history of other complications of pregnancy, childbirth and the puerperium: Secondary | ICD-10-CM | POA: Diagnosis not present

## 2020-02-17 LAB — CBC
HCT: 32 % — ABNORMAL LOW (ref 36.0–46.0)
Hemoglobin: 11.4 g/dL — ABNORMAL LOW (ref 12.0–15.0)
MCH: 30.4 pg (ref 26.0–34.0)
MCHC: 35.6 g/dL (ref 30.0–36.0)
MCV: 85.3 fL (ref 80.0–100.0)
Platelets: 222 10*3/uL (ref 150–400)
RBC: 3.75 MIL/uL — ABNORMAL LOW (ref 3.87–5.11)
RDW: 12.3 % (ref 11.5–15.5)
WBC: 8.4 10*3/uL (ref 4.0–10.5)
nRBC: 0 % (ref 0.0–0.2)

## 2020-02-17 LAB — WET PREP, GENITAL
Trich, Wet Prep: NONE SEEN
Yeast Wet Prep HPF POC: NONE SEEN
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
## 2020-02-18 ENCOUNTER — Encounter (HOSPITAL_COMMUNITY): Payer: Self-pay | Admitting: Obstetrics and Gynecology

## 2020-02-18 LAB — HCG, QUANTITATIVE, PREGNANCY: hCG, Beta Chain, Quant, S: 100657 m[IU]/mL — ABNORMAL HIGH (ref <5)

## 2020-02-18 LAB — GC/CHLAMYDIA PROBE AMP (~~LOC~~) NOT AT ARMC
Chlamydia: POSITIVE — AB
Comment: NEGATIVE
Comment: NORMAL
Neisseria Gonorrhea: NEGATIVE

## 2020-02-18 LAB — HIV ANTIBODY (ROUTINE TESTING W REFLEX): HIV Screen 4th Generation wRfx: NONREACTIVE

## 2020-02-18 NOTE — Discharge Instructions (Signed)
Subchorionic Hematoma ° °A subchorionic hematoma is a gathering of blood between the outer wall of the embryo (chorion) and the inner wall of the womb (uterus). °This condition can cause vaginal bleeding. If they cause little or no vaginal bleeding, early small hematomas usually shrink on their own and do not affect your baby or pregnancy. When bleeding starts later in pregnancy, or if the hematoma is larger or occurs in older pregnant women, the condition may be more serious. Larger hematomas may get bigger, which increases the chances of miscarriage. This condition also increases the risk of: °· Premature separation of the placenta from the uterus. °· Premature (preterm) labor. °· Stillbirth. °What are the causes? °The exact cause of this condition is not known. It occurs when blood is trapped between the placenta and the uterine wall because the placenta has separated from the original site of implantation. °What increases the risk? °You are more likely to develop this condition if: °· You were treated with fertility medicines. °· You conceived through in vitro fertilization (IVF). °What are the signs or symptoms? °Symptoms of this condition include: °· Vaginal spotting or bleeding. °· Contractions of the uterus. These cause abdominal pain. °Sometimes you may have no symptoms and the bleeding may only be seen when ultrasound images are taken (transvaginal ultrasound). °How is this diagnosed? °This condition is diagnosed based on a physical exam. This includes a pelvic exam. You may also have other tests, including: °· Blood tests. °· Urine tests. °· Ultrasound of the abdomen. °How is this treated? °Treatment for this condition can vary. Treatment may include: °· Watchful waiting. You will be monitored closely for any changes in bleeding. During this stage: °? The hematoma may be reabsorbed by the body. °? The hematoma may separate the fluid-filled space containing the embryo (gestational sac) from the wall of the  womb (endometrium). °· Medicines. °· Activity restriction. This may be needed until the bleeding stops. °Follow these instructions at home: °· Stay on bed rest if told to do so by your health care provider. °· Do not lift anything that is heavier than 10 lbs. (4.5 kg) or as told by your health care provider. °· Do not use any products that contain nicotine or tobacco, such as cigarettes and e-cigarettes. If you need help quitting, ask your health care provider. °· Track and write down the number of pads you use each day and how soaked (saturated) they are. °· Do not use tampons. °· Keep all follow-up visits as told by your health care provider. This is important. Your health care provider may ask you to have follow-up blood tests or ultrasound tests or both. °Contact a health care provider if: °· You have any vaginal bleeding. °· You have a fever. °Get help right away if: °· You have severe cramps in your stomach, back, abdomen, or pelvis. °· You pass large clots or tissue. Save any tissue for your health care provider to look at. °· You have more vaginal bleeding, and you faint or become lightheaded or weak. °Summary °· A subchorionic hematoma is a gathering of blood between the outer wall of the placenta and the uterus. °· This condition can cause vaginal bleeding. °· Sometimes you may have no symptoms and the bleeding may only be seen when ultrasound images are taken. °· Treatment may include watchful waiting, medicines, or activity restriction. °This information is not intended to replace advice given to you by your health care provider. Make sure you discuss any questions you   have with your health care provider. Document Revised: 08/30/2017 Document Reviewed: 11/13/2016 Elsevier Patient Education  2020 [employer]  [facility] Providers    [facility] [employer] [facility]       Phone: [phone]  [facility] [employer] at [place]   Phone: [phone]  [facility] [employer] at [place]  Phone: [phone]  [facility] at [facility]  Phone: [phone]  [facility] for [facility] at [place]  Phone: [phone]  [facility] at [facility]   Phone: [phone]  [facility]       Phone: [phone]  [facility]    Phone: [phone]   [facility]    Phone: [phone]  [facility]    Phone: [phone]  [facility]    Phone: [phone]  [facility]-Family Planning       Phone: [phone]   [facility]-M[facility]  Phone: [phone]  [facility]    Phone: [phone]  [facility]   Phone: [phone]  [facility]      Phone: [phone]  [facility]    Phone: [phone]

## 2020-02-19 ENCOUNTER — Other Ambulatory Visit (INDEPENDENT_AMBULATORY_CARE_PROVIDER_SITE_OTHER): Payer: Self-pay | Admitting: Advanced Practice Midwife

## 2020-02-19 ENCOUNTER — Other Ambulatory Visit: Payer: Self-pay | Admitting: Medical

## 2020-02-19 ENCOUNTER — Encounter: Payer: Self-pay | Admitting: Advanced Practice Midwife

## 2020-02-19 DIAGNOSIS — A749 Chlamydial infection, unspecified: Secondary | ICD-10-CM

## 2020-02-19 DIAGNOSIS — O98819 Other maternal infectious and parasitic diseases complicating pregnancy, unspecified trimester: Secondary | ICD-10-CM | POA: Insufficient documentation

## 2020-02-19 DIAGNOSIS — O98811 Other maternal infectious and parasitic diseases complicating pregnancy, first trimester: Secondary | ICD-10-CM

## 2020-02-19 MED ORDER — AZITHROMYCIN 250 MG PO TABS: 1000.0000 mg | ORAL_TABLET | Freq: Once | ORAL | 0 refills | Status: AC

## 2020-02-19 MED ORDER — AZITHROMYCIN 500 MG PO TABS
1000.0000 mg | ORAL_TABLET | Freq: Once | ORAL | 1 refills | Status: DC
Start: 2020-02-19 — End: 2020-02-19

## 2020-02-19 NOTE — Progress Notes (Signed)
Rx sent for treatment of chlamydia

## 2020-03-09 ENCOUNTER — Inpatient Hospital Stay (HOSPITAL_COMMUNITY)
Admission: AD | Admit: 2020-03-09 | Discharge: 2020-03-10 | Disposition: A | Discharge status: Home / Self Care | Payer: Medicaid Other | Attending: Obstetrics and Gynecology | Admitting: Obstetrics and Gynecology

## 2020-03-09 ENCOUNTER — Encounter (HOSPITAL_COMMUNITY): Payer: Self-pay | Admitting: Obstetrics and Gynecology

## 2020-03-09 DIAGNOSIS — O98311 Other infections with a predominantly sexual mode of transmission complicating pregnancy, first trimester: Secondary | ICD-10-CM | POA: Insufficient documentation

## 2020-03-09 DIAGNOSIS — A5901 Trichomonal vulvovaginitis: Secondary | ICD-10-CM | POA: Insufficient documentation

## 2020-03-09 DIAGNOSIS — A599 Trichomoniasis, unspecified: Secondary | ICD-10-CM | POA: Diagnosis present

## 2020-03-09 DIAGNOSIS — Z3A13 13 weeks gestation of pregnancy: Secondary | ICD-10-CM | POA: Insufficient documentation

## 2020-03-09 DIAGNOSIS — O208 Other hemorrhage in early pregnancy: Secondary | ICD-10-CM | POA: Insufficient documentation

## 2020-03-09 DIAGNOSIS — O209 Hemorrhage in early pregnancy, unspecified: Secondary | ICD-10-CM

## 2020-03-09 DIAGNOSIS — Z87891 Personal history of nicotine dependence: Secondary | ICD-10-CM | POA: Insufficient documentation

## 2020-03-09 DIAGNOSIS — O468X1 Other antepartum hemorrhage, first trimester: Secondary | ICD-10-CM

## 2020-03-09 NOTE — MAU Note (Signed)
Spotting off and on for 3 wks. Some lower back pain. Occ lower abd pain but mostly lower back pain Throws up on occ. Came into Triage eating pizza from [employer].

## 2020-03-10 DIAGNOSIS — Z3A13 13 weeks gestation of pregnancy: Secondary | ICD-10-CM | POA: Diagnosis not present

## 2020-03-10 DIAGNOSIS — A5901 Trichomonal vulvovaginitis: Secondary | ICD-10-CM

## 2020-03-10 DIAGNOSIS — O208 Other hemorrhage in early pregnancy: Secondary | ICD-10-CM

## 2020-03-10 DIAGNOSIS — Z87891 Personal history of nicotine dependence: Secondary | ICD-10-CM | POA: Diagnosis not present

## 2020-03-10 DIAGNOSIS — A599 Trichomoniasis, unspecified: Secondary | ICD-10-CM | POA: Diagnosis present

## 2020-03-10 DIAGNOSIS — O98311 Other infections with a predominantly sexual mode of transmission complicating pregnancy, first trimester: Secondary | ICD-10-CM | POA: Diagnosis not present

## 2020-03-10 DIAGNOSIS — N939 Abnormal uterine and vaginal bleeding, unspecified: Secondary | ICD-10-CM | POA: Diagnosis present

## 2020-03-10 LAB — WET PREP, GENITAL
Sperm: NONE SEEN
Yeast Wet Prep HPF POC: NONE SEEN

## 2020-03-10 LAB — URINALYSIS, ROUTINE W REFLEX MICROSCOPIC
Bacteria, UA: NONE SEEN
Bilirubin Urine: NEGATIVE
Glucose, UA: NEGATIVE mg/dL
Hgb urine dipstick: NEGATIVE
Ketones, ur: NEGATIVE mg/dL
Nitrite: NEGATIVE
Protein, ur: NEGATIVE mg/dL
Specific Gravity, Urine: 1.017 (ref 1.005–1.030)
pH: 6 (ref 5.0–8.0)

## 2020-03-10 LAB — GC/CHLAMYDIA PROBE AMP (~~LOC~~) NOT AT ARMC
Chlamydia: NEGATIVE
Comment: NEGATIVE
Comment: NORMAL
Neisseria Gonorrhea: NEGATIVE

## 2020-03-10 NOTE — MAU Provider Note (Signed)
Chief Complaint: Vaginal Bleeding   First Provider Initiated Contact with Patient 03/10/20 0007        SUBJECTIVE HPI: Amanda Russell is a 24 y.o. [id number] at [redacted]w[redacted]d by LMP who presents to maternity admissions reporting spotting for 3 weeks.  Has some lower abdominal pain at times.  . She denies vaginal bleeding, vaginal itching/burning, urinary symptoms, h/a, dizziness, or fever/chills.    Seen by me on 02/17/20 for bleeding.  Live IUP seen as well as small subchorionic hemorrhage. Was also treated for chlamydia.  When asked if she has had relations with partner since then, she hesitates and then says no.   Vaginal Bleeding The patient's primary symptoms include vaginal bleeding. The patient's pertinent negatives include no genital itching, genital lesions, genital odor or pelvic pain. This is a recurrent problem. The current episode started 1 to 4 weeks ago. The problem occurs intermittently. The problem has been waxing and waning. The pain is mild. She is pregnant. Associated symptoms include abdominal pain and nausea. Pertinent negatives include no chills, constipation, diarrhea, fever or frequency. The vaginal discharge was bloody. The vaginal bleeding is spotting. She has not been passing clots. She has not been passing tissue. Nothing aggravates the symptoms. She has tried nothing for the symptoms. She is sexually active. It is unknown whether or not her partner has an STD.   RN Note: Spotting off and on for 3 wks. Some lower back pain. Occ lower abd pain but mostly lower back pain Throws up on occ. Came into Triage eating pizza from [employer].   Past Medical History:  Diagnosis Date  . ADHD (attention deficit hyperactivity disorder), combined type 07/14/2012  . Chlamydia   . Preterm premature rupture of membranes (PPROM) with unknown onset of labor 07/13/2014  . Suicidal ideation    Past Surgical History:  Procedure Laterality Date  . LAPAROSCOPY Left 10/16/2019   Procedure: LAPAROSCOPIC  LEFT SALPINGECTOMY;  Surgeon: Brownlee Bing, MD;  Location: [facility];  Service: Gynecology;  Laterality: Left;  . NO PAST SURGERIES     Social History   Socioeconomic History  . Marital status: Single    Spouse name: Not on file  . Number of children: Not on file  . Years of education: Not on file  . Highest education level: Not on file  Occupational History  . 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 Smoking status: Former Smoker    Packs/day: 0.50    Years: 0.80    Pack years: 0.40    Types: Cigarettes    Quit date: 01/07/2011    Years since quitting: 9.1  . Smokeless tobacco: Never Used  Substance and Sexual Activity  . Alcohol use: Not Currently    Comment: socially  . Drug use: Not Currently    Frequency: 7.0 times per week    Types: Marijuana    Comment: denies pt states it has been years 02/18/20  . Sexual activity: Yes    Partners: Male    Birth control/protection: None    Comment: Patient states her last time taking BCP was last year  Other Topics Concern  . Not on file  Social History Narrative  . Not on file   Social Determinants of Health   Financial Resource Strain:   . Difficulty of Paying Living Expenses:   Food Insecurity:   . Worried About Programme researcher, broadcasting/film/video in the Last Year:   . [employer]  in the Last Year:   Transportation Needs:   . Film/video editor (Medical):   Marland Kitchen Lack of Transportation (Non-Medical):   Physical Activity:   . Days of Exercise per Week:   . Minutes of Exercise per Session:   Stress:   . Feeling of Stress :   Social Connections:   . Frequency of Communication with Friends and Family:   . Frequency of Social Gatherings with Friends and Family:   . Attends Religious Services:   . Active Member of Clubs or Organizations:   . Attends Archivist Meetings:   Marland Kitchen Marital Status:   Intimate Partner Violence:   . Fear of Current or Ex-Partner:   . Emotionally Abused:   Marland Kitchen  Physically Abused:   . Sexually Abused:    No current facility-administered medications on file prior to encounter.   Current Outpatient Medications on File Prior to Encounter  Medication Sig Dispense Refill  . Prenatal Vit-Fe Fumarate-FA (PRENATAL MULTIVITAMIN) TABS tablet Take 1 tablet by mouth daily at 12 noon.    . cyclobenzaprine (FLEXERIL) 10 MG tablet Take 1 tablet (10 mg total) by mouth 2 (two) times daily as needed for muscle spasms. 20 tablet 0   Allergies  Allergen Reactions  . Pollen Extract Itching and Cough    I have reviewed patient's Past Medical Hx, Surgical Hx, Family Hx, Social Hx, medications and allergies.   ROS:  Review of Systems  Constitutional: Negative for chills and fever.  Gastrointestinal: Positive for abdominal pain and nausea. Negative for constipation and diarrhea.  Genitourinary: Positive for vaginal bleeding. Negative for frequency and pelvic pain.   Review of Systems  Other systems negative   Physical Exam  Physical Exam Patient Vitals for the past 24 hrs:  BP Temp Pulse Resp Height Weight  03/09/20 2351 (!) 101/52 -- 94 -- -- --  03/09/20 2350 -- 98.6 F (37 C) -- 16 -- --  03/09/20 2347 -- -- -- -- 5\' 5"  (1.651 m) 64.9 kg   Constitutional: Well-developed, well-nourished female in no acute distress.  Cardiovascular: normal rate Respiratory: normal effort GI: Abd soft, non-tender. Pos BS x 4 MS: Extremities nontender, no edema, normal ROM Neurologic: Alert and oriented x 4.  GU: Neg CVAT.  PELVIC EXAM: Cervix pink, visually closed, without lesion, scant pink-white creamy discharge, vaginal walls and external genitalia normal  FHT 160 by doppler  LAB RESULTS Results for orders placed or performed during the hospital encounter of 03/09/20 (from the past 24 hour(s))  Urinalysis, Routine w reflex microscopic     Status: Abnormal   Collection Time: 03/10/20 12:02 AM  Result Value Ref Range   Color, Urine YELLOW YELLOW   APPearance  CLEAR CLEAR   Specific Gravity, Urine 1.017 1.005 - 1.030   pH 6.0 5.0 - 8.0   Glucose, UA NEGATIVE NEGATIVE mg/dL   Hgb urine dipstick NEGATIVE NEGATIVE   Bilirubin Urine NEGATIVE NEGATIVE   Ketones, ur NEGATIVE NEGATIVE mg/dL   Protein, ur NEGATIVE NEGATIVE mg/dL   Nitrite NEGATIVE NEGATIVE   Leukocytes,Ua TRACE (A) NEGATIVE   RBC / HPF 0-5 0 - 5 RBC/hpf   WBC, UA 0-5 0 - 5 WBC/hpf   Bacteria, UA NONE SEEN NONE SEEN   Squamous Epithelial / LPF 0-5 0 - 5   Mucus PRESENT   Wet prep, genital     Status: Abnormal   Collection Time: 03/10/20 12:24 AM   Specimen: Urine, Clean Catch  Result Value Ref Range   Yeast  Wet Prep HPF POC NONE SEEN NONE SEEN   Trich, Wet Prep PRESENT (A) NONE SEEN   Clue Cells Wet Prep HPF POC PRESENT (A) NONE SEEN   WBC, Wet Prep HPF POC MANY (A) NONE SEEN   Sperm NONE SEEN      --/--/B POS, B POS Performed at [facility], [address], [place], [place] [address]  [phone])  IMAGING US OB Comp Less 14 Wks  Result Date: 02/18/2020 CLINICAL DATA:  Initial evaluation for acute vaginal bleeding, early pregnancy. EXAM: OBSTETRIC <14 WK ULTRASOUND TECHNIQUE: Transabdominal ultrasound was performed for evaluation of the gestation as well as the maternal uterus and adnexal regions. COMPARISON:  None available. FINDINGS: Intrauterine gestational sac: Single Yolk sac:  Present Embryo:  Present Cardiac Activity: Present Heart Rate: 171 bpm CRL: 40.6 mm   10 w 6 d                  [place] EDC: 09/09/2020 Subchorionic hemorrhage: Small subchorionic hemorrhage measures 1.2 x 2.0 x 1.0 cm. No associated mass effect. Maternal uterus/adnexae: Ovaries are within normal limits bilaterally. Small corpus luteal cyst noted on the right. No free fluid or adnexal mass. IMPRESSION: 1. Single viable intrauterine pregnancy, estimated gestational age [redacted] weeks and 6 days by crown-rump length, with ultrasound EDC of 09/09/2020. 2. Small subchorionic hemorrhage as above  without associated mass effect. 3. No other acute maternal uterine or adnexal abnormality. Electronically Signed   By: Rise Mu M.D.   On: 02/18/2020 00:42    MAU Management/MDM: Ordered wet prep and repeat GC/Chlamydia Results show + trichomonas.  Discussed this can cause cervicitis and bleeding.  [facility] previously identified can also cause spotting Rx given for Flagyl to treat this.  Encouraged her to abstain x 2 weeks and have partner go to HD for treatment  ASSESSMENT Single IUP at [redacted]w[redacted]d Spotting in pregnancy Trichomonal vaginitis Subchorionic hemorrhage  PLAN Discharge home Rx Flagyl 2gm po x1 Have partner get treated Abstain from IC x 2 wks Pt stable at time of discharge. Encouraged to return here or to other Urgent Care/ED if she develops worsening of symptoms, increase in pain, fever, or other concerning symptoms.    Wynelle Bourgeois CNM, MSN Certified Nurse-Midwife 03/10/2020  12:07 AM

## 2020-03-10 NOTE — Progress Notes (Signed)
Wynelle Bourgeois CNM in earlier to discuss d/c plan with pt. Written and verbal d/c instructions given and understanding voiced. Pt d/c during downtime and signed paper d/c sheet

## 2020-03-10 NOTE — Discharge Instructions (Signed)
Trichomoniasis Trichomoniasis is an STI (sexually transmitted infection) that can affect both women and men. In women, the outer area of the female genitalia (vulva) and the vagina are affected. In men, mainly the penis is affected, but the prostate and other reproductive organs can also be involved.  This condition can be treated with medicine. It often has no symptoms (is asymptomatic), especially in men. If not treated, trichomoniasis can last for months or years. What are the causes? This condition is caused by a parasite called Trichomonas vaginalis. Trichomoniasis most often spreads from person to person (is contagious) through sexual contact. What increases the risk? The following factors may make you more likely to develop this condition:  Having unprotected sex.  Having sex with a partner who has trichomoniasis.  Having multiple sexual partners.  Having had previous trichomoniasis infections or other STIs. What are the signs or symptoms? In women, symptoms of trichomoniasis include:  Abnormal vaginal discharge that is clear, white, gray, or yellow-green and foamy and has an unusual "fishy" odor.  Itching and irritation of the vagina and vulva.  Burning or pain during urination or sex.  Redness and swelling of the genitals. In men, symptoms of trichomoniasis include:  Penile discharge that may be foamy or contain pus.  Pain in the penis. This may happen only when urinating.  Itching or irritation inside the penis.  Burning after urination or ejaculation. How is this diagnosed? In women, this condition may be found during a routine Pap test or physical exam. It may be found in men during a routine physical exam. Your health care provider may do tests to help diagnose this infection, such as:  Urine tests (men and women).  The following in women: ? Testing the pH of the vagina. ? A vaginal swab test that checks for the Trichomonas vaginalis parasite. ? Testing vaginal  secretions. Your health care provider may test you for other STIs, including HIV (human immunodeficiency virus). How is this treated? This condition is treated with medicine taken by mouth (orally), such as metronidazole or tinidazole, to fight the infection. Your sexual partner(s) also need to be tested and treated.  If you are a woman and you plan to become pregnant or think you may be pregnant, tell your health care provider right away. Some medicines that are used to treat the infection should not be taken during pregnancy. Your health care provider may recommend over-the-counter medicines or creams to help relieve itching or irritation. You may be tested for infection again 3 months after treatment. Follow these instructions at home:  Take and use over-the-counter and prescription medicines, including creams, only as told by your health care provider.  Take your antibiotic medicine as told by your health care provider. Do not stop taking the antibiotic even if you start to feel better.  Do not have sex until 7-10 days after you finish your medicine, or until your health care provider approves. Ask your health care provider when you may start to have sex again.  (Women) Do not douche or wear tampons while you have the infection.  Discuss your infection with your sexual partner(s). Make sure that your partner gets tested and treated, if necessary.  Keep all follow-up visits as told by your health care provider. This is important. How is this prevented?   Use condoms every time you have sex. Using condoms correctly and consistently can help protect against STIs.  Avoid having multiple sexual partners.  Talk with your sexual partner about any   symptoms that either of you may have, as well as any history of STIs.  Get tested for STIs and STDs (sexually transmitted diseases) before you have sex. Ask your partner to do the same.  Do not have sexual contact if you have symptoms of  trichomoniasis or another STI. Contact a health care provider if:  You still have symptoms after you finish your medicine.  You develop pain in your abdomen.  You have pain when you urinate.  You have bleeding after sex.  You develop a rash.  You feel nauseous or you vomit.  You plan to become pregnant or think you may be pregnant. Summary  Trichomoniasis is an STI (sexually transmitted infection) that can affect both women and men.  This condition often has no symptoms (is asymptomatic), especially in men.  Without treatment, this condition can last for months or years.  You should not have sex until 7-10 days after you finish your medicine, or until your health care provider approves. Ask your health care provider when you may start to have sex again.  Discuss your infection with your sexual partner(s). Make sure that your partner gets tested and treated, if necessary. This information is not intended to replace advice given to you by your health care provider. Make sure you discuss any questions you have with your health care provider. Document Revised: 07/01/2018 Document Reviewed: 07/01/2018 Elsevier Patient Education  [place].   Vaginal Bleeding During Pregnancy, First Trimester  A small amount of bleeding from the vagina (spotting) is relatively common during early pregnancy. It usually stops on its own. Various things may cause bleeding or spotting during early pregnancy. Some bleeding may be related to the pregnancy, and some may not. In many cases, the bleeding is normal and is not a problem. However, bleeding can also be a sign of something serious. Be sure to tell your health care provider about any vaginal bleeding right away. Some possible causes of vaginal bleeding during the first trimester include:  Infection or inflammation of the cervix.  Growths (polyps) on the cervix.  Miscarriage or threatened miscarriage.  Pregnancy tissue developing  outside of the uterus (ectopic pregnancy).  A mass of tissue developing in the uterus due to an egg being fertilized incorrectly (molar pregnancy). Follow these instructions at home: Activity  Follow instructions from your health care provider about limiting your activity. Ask what activities are safe for you.  If needed, make plans for someone to help with your regular activities.  Do not have sex or orgasms until your health care provider says that this is safe. General instructions  Take over-the-counter and prescription medicines only as told by your health care provider.  Pay attention to any changes in your symptoms.  Do not use tampons or douche.  Write down how many pads you use each day, how often you change pads, and how soaked (saturated) they are.  If you pass any tissue from your vagina, save the tissue so you can show it to your health care provider.  Keep all follow-up visits as told by your health care provider. This is important. Contact a health care provider if:  You have vaginal bleeding during any part of your pregnancy.  You have cramps or labor pains.  You have a fever. Get help right away if:  You have severe cramps in your back or abdomen.  You pass large clots or a large amount of tissue from your vagina.  Your bleeding increases.  You  feel light-headed or weak, or you faint.  You have chills.  You are leaking fluid or have a gush of fluid from your vagina. Summary  A small amount of bleeding (spotting) from the vagina is relatively common during early pregnancy.  Various things may cause bleeding or spotting in early pregnancy.  Be sure to tell your health care provider about any vaginal bleeding right away. This information is not intended to replace advice given to you by your health care provider. Make sure you discuss any questions you have with your health care provider. Document Revised: 01/06/2019 Document Reviewed:  12/20/2016 Elsevier Patient Education  2020 [employer].

## 2020-03-17 LAB — OB RESULTS CONSOLE HEPATITIS B SURFACE ANTIGEN: Hepatitis B Surface Ag: NEGATIVE

## 2020-03-28 DIAGNOSIS — Z3490 Encounter for supervision of normal pregnancy, unspecified, unspecified trimester: Secondary | ICD-10-CM | POA: Insufficient documentation

## 2020-03-28 HISTORY — DX: Encounter for supervision of normal pregnancy, unspecified, unspecified trimester: Z34.90

## 2020-04-29 LAB — OB RESULTS CONSOLE HIV ANTIBODY (ROUTINE TESTING): HIV: NONREACTIVE

## 2020-04-29 LAB — OB RESULTS CONSOLE RUBELLA ANTIBODY, IGM: Rubella: IMMUNE

## 2020-06-10 LAB — OB RESULTS CONSOLE RPR: RPR: NONREACTIVE

## 2020-08-17 LAB — OB RESULTS CONSOLE GC/CHLAMYDIA
Chlamydia: NEGATIVE
Gonorrhea: NEGATIVE

## 2020-09-02 ENCOUNTER — Telehealth (HOSPITAL_COMMUNITY): Payer: Self-pay | Admitting: *Deleted

## 2020-09-02 NOTE — Telephone Encounter (Signed)
Preadmission screen  

## 2020-09-04 ENCOUNTER — Inpatient Hospital Stay (HOSPITAL_COMMUNITY): Payer: Medicaid Other | Admitting: Anesthesiology

## 2020-09-04 ENCOUNTER — Inpatient Hospital Stay (HOSPITAL_COMMUNITY)
Admission: AD | Admit: 2020-09-04 | Discharge: 2020-09-06 | DRG: 807 | Disposition: A | Discharge status: Home / Self Care | Payer: Medicaid Other | Attending: Obstetrics and Gynecology | Admitting: Obstetrics and Gynecology

## 2020-09-04 ENCOUNTER — Other Ambulatory Visit: Payer: Self-pay

## 2020-09-04 ENCOUNTER — Encounter (HOSPITAL_COMMUNITY): Payer: Self-pay | Admitting: Obstetrics and Gynecology

## 2020-09-04 DIAGNOSIS — Z3A39 39 weeks gestation of pregnancy: Secondary | ICD-10-CM | POA: Diagnosis not present

## 2020-09-04 DIAGNOSIS — Z20822 Contact with and (suspected) exposure to covid-19: Secondary | ICD-10-CM | POA: Diagnosis present

## 2020-09-04 DIAGNOSIS — F1290 Cannabis use, unspecified, uncomplicated: Secondary | ICD-10-CM | POA: Diagnosis present

## 2020-09-04 DIAGNOSIS — O26893 Other specified pregnancy related conditions, third trimester: Secondary | ICD-10-CM | POA: Diagnosis present

## 2020-09-04 DIAGNOSIS — O99324 Drug use complicating childbirth: Secondary | ICD-10-CM | POA: Diagnosis present

## 2020-09-04 DIAGNOSIS — O99824 Streptococcus B carrier state complicating childbirth: Secondary | ICD-10-CM | POA: Diagnosis present

## 2020-09-04 LAB — CBC
HCT: 28.9 % — ABNORMAL LOW (ref 36.0–46.0)
Hemoglobin: 9.8 g/dL — ABNORMAL LOW (ref 12.0–15.0)
MCH: 28.4 pg (ref 26.0–34.0)
MCHC: 33.9 g/dL (ref 30.0–36.0)
MCV: 83.8 fL (ref 80.0–100.0)
Platelets: 170 10*3/uL (ref 150–400)
RBC: 3.45 MIL/uL — ABNORMAL LOW (ref 3.87–5.11)
RDW: 12.7 % (ref 11.5–15.5)
WBC: 11.2 10*3/uL — ABNORMAL HIGH (ref 4.0–10.5)
nRBC: 0 % (ref 0.0–0.2)

## 2020-09-04 LAB — RESP PANEL BY RT-PCR (FLU A&B, COVID) ARPGX2
Influenza A by PCR: NEGATIVE
Influenza B by PCR: NEGATIVE
SARS Coronavirus 2 by RT PCR: NEGATIVE

## 2020-09-04 LAB — TYPE AND SCREEN
ABO/RH(D): B POS
Antibody Screen: NEGATIVE

## 2020-09-04 LAB — OB RESULTS CONSOLE GBS
GBS: NEGATIVE
GBS: POSITIVE

## 2020-09-04 MED ORDER — OXYCODONE HCL 5 MG PO TABS
5.0000 mg | ORAL_TABLET | ORAL | Status: DC | PRN
  Administered 2020-09-05: 5 mg via ORAL
  Filled 2020-09-04: qty 1

## 2020-09-04 MED ORDER — TETANUS-DIPHTH-ACELL PERTUSSIS 5-2.5-18.5 LF-MCG/0.5 IM SUSY: 0.5000 mL | PREFILLED_SYRINGE | Freq: Once | INTRAMUSCULAR | Status: DC

## 2020-09-04 MED ORDER — SOD CITRATE-CITRIC ACID 500-334 MG/5ML PO SOLN: 30.0000 mL | ORAL | Status: DC | PRN

## 2020-09-04 MED ORDER — LACTATED RINGERS IV SOLN: INTRAVENOUS | Status: DC

## 2020-09-04 MED ORDER — SODIUM CHLORIDE 0.9 % IV SOLN
2.0000 g | Freq: Once | INTRAVENOUS | Status: AC
  Administered 2020-09-04: 2 g via INTRAVENOUS
  Filled 2020-09-04: qty 2000

## 2020-09-04 MED ORDER — ZOLPIDEM TARTRATE 5 MG PO TABS: 5.0000 mg | ORAL_TABLET | Freq: Every evening | ORAL | Status: DC | PRN

## 2020-09-04 MED ORDER — SENNOSIDES-DOCUSATE SODIUM 8.6-50 MG PO TABS
2.0000 | ORAL_TABLET | ORAL | Status: DC
  Administered 2020-09-04 – 2020-09-05 (×2): 2 via ORAL
  Filled 2020-09-04 (×2): qty 2

## 2020-09-04 MED ORDER — FENTANYL CITRATE (PF) 100 MCG/2ML IJ SOLN
50.0000 ug | INTRAMUSCULAR | Status: DC | PRN
  Administered 2020-09-04: 100 ug via INTRAVENOUS

## 2020-09-04 MED ORDER — LACTATED RINGERS IV SOLN: 500.0000 mL | INTRAVENOUS | Status: DC | PRN

## 2020-09-04 MED ORDER — OXYCODONE-ACETAMINOPHEN 5-325 MG PO TABS: 1.0000 | ORAL_TABLET | ORAL | Status: DC | PRN

## 2020-09-04 MED ORDER — ONDANSETRON HCL 4 MG/2ML IJ SOLN: 4.0000 mg | Freq: Four times a day (QID) | INTRAMUSCULAR | Status: DC | PRN

## 2020-09-04 MED ORDER — ONDANSETRON HCL 4 MG PO TABS: 4.0000 mg | ORAL_TABLET | ORAL | Status: DC | PRN

## 2020-09-04 MED ORDER — LACTATED RINGERS IV SOLN
500.0000 mL | Freq: Once | INTRAVENOUS | Status: AC
  Administered 2020-09-04: 500 mL via INTRAVENOUS

## 2020-09-04 MED ORDER — OXYTOCIN BOLUS FROM INFUSION: 333.0000 mL | Freq: Once | INTRAVENOUS | Status: DC

## 2020-09-04 MED ORDER — IBUPROFEN 600 MG PO TABS
600.0000 mg | ORAL_TABLET | Freq: Four times a day (QID) | ORAL | Status: DC
  Administered 2020-09-04 – 2020-09-06 (×8): 600 mg via ORAL
  Filled 2020-09-04 (×8): qty 1

## 2020-09-04 MED ORDER — METHYLERGONOVINE MALEATE 0.2 MG PO TABS: 0.2000 mg | ORAL_TABLET | ORAL | Status: DC | PRN

## 2020-09-04 MED ORDER — WITCH HAZEL-GLYCERIN EX PADS: 1.0000 "application " | MEDICATED_PAD | CUTANEOUS | Status: DC | PRN

## 2020-09-04 MED ORDER — EPHEDRINE 5 MG/ML INJ: 10.0000 mg | INTRAVENOUS | Status: DC | PRN

## 2020-09-04 MED ORDER — DIPHENHYDRAMINE HCL 50 MG/ML IJ SOLN: 12.5000 mg | INTRAMUSCULAR | Status: DC | PRN

## 2020-09-04 MED ORDER — ACETAMINOPHEN 325 MG PO TABS: 650.0000 mg | ORAL_TABLET | ORAL | Status: DC | PRN

## 2020-09-04 MED ORDER — FLEET ENEMA 7-19 GM/118ML RE ENEM: 1.0000 | ENEMA | RECTAL | Status: DC | PRN

## 2020-09-04 MED ORDER — SODIUM CHLORIDE (PF) 0.9 % IJ SOLN
INTRAMUSCULAR | Status: DC | PRN
  Administered 2020-09-04: 12 mL/h via EPIDURAL

## 2020-09-04 MED ORDER — FENTANYL-BUPIVACAINE-NACL 0.5-0.125-0.9 MG/250ML-% EP SOLN
12.0000 mL/h | EPIDURAL | Status: DC | PRN
  Filled 2020-09-04: qty 250

## 2020-09-04 MED ORDER — PHENYLEPHRINE 40 MCG/ML (10ML) SYRINGE FOR IV PUSH (FOR BLOOD PRESSURE SUPPORT)
80.0000 ug | PREFILLED_SYRINGE | INTRAVENOUS | Status: DC | PRN
  Filled 2020-09-04: qty 10

## 2020-09-04 MED ORDER — OXYCODONE-ACETAMINOPHEN 5-325 MG PO TABS: 2.0000 | ORAL_TABLET | ORAL | Status: DC | PRN

## 2020-09-04 MED ORDER — FENTANYL CITRATE (PF) 100 MCG/2ML IJ SOLN
INTRAMUSCULAR | Status: AC
  Filled 2020-09-04: qty 2

## 2020-09-04 MED ORDER — LIDOCAINE HCL (PF) 1 % IJ SOLN: 30.0000 mL | INTRAMUSCULAR | Status: DC | PRN

## 2020-09-04 MED ORDER — SIMETHICONE 80 MG PO CHEW: 80.0000 mg | CHEWABLE_TABLET | ORAL | Status: DC | PRN

## 2020-09-04 MED ORDER — ACETAMINOPHEN 325 MG PO TABS
650.0000 mg | ORAL_TABLET | ORAL | Status: DC | PRN
  Administered 2020-09-04: 650 mg via ORAL
  Filled 2020-09-04: qty 2

## 2020-09-04 MED ORDER — METHYLERGONOVINE MALEATE 0.2 MG/ML IJ SOLN: 0.2000 mg | INTRAMUSCULAR | Status: DC | PRN

## 2020-09-04 MED ORDER — DIPHENHYDRAMINE HCL 25 MG PO CAPS
25.0000 mg | ORAL_CAPSULE | Freq: Four times a day (QID) | ORAL | Status: DC | PRN
  Administered 2020-09-04: 25 mg via ORAL
  Filled 2020-09-04: qty 1

## 2020-09-04 MED ORDER — LIDOCAINE HCL (PF) 1 % IJ SOLN
INTRAMUSCULAR | Status: DC | PRN
  Administered 2020-09-04: 5 mL via EPIDURAL
  Administered 2020-09-04: 3 mL via EPIDURAL
  Administered 2020-09-04: 2 mL via EPIDURAL

## 2020-09-04 MED ORDER — OXYTOCIN BOLUS FROM INFUSION
333.0000 mL | Freq: Once | INTRAVENOUS | Status: AC
  Administered 2020-09-04: 333 mL via INTRAVENOUS

## 2020-09-04 MED ORDER — ONDANSETRON HCL 4 MG/2ML IJ SOLN: 4.0000 mg | INTRAMUSCULAR | Status: DC | PRN

## 2020-09-04 MED ORDER — PRENATAL MULTIVITAMIN CH
1.0000 | ORAL_TABLET | Freq: Every day | ORAL | Status: DC
  Administered 2020-09-05 – 2020-09-06 (×2): 1 via ORAL
  Filled 2020-09-04 (×2): qty 1

## 2020-09-04 MED ORDER — PHENYLEPHRINE 40 MCG/ML (10ML) SYRINGE FOR IV PUSH (FOR BLOOD PRESSURE SUPPORT): 80.0000 ug | PREFILLED_SYRINGE | INTRAVENOUS | Status: DC | PRN

## 2020-09-04 MED ORDER — OXYCODONE HCL 5 MG PO TABS
10.0000 mg | ORAL_TABLET | ORAL | Status: DC | PRN
  Administered 2020-09-05 – 2020-09-06 (×3): 10 mg via ORAL
  Filled 2020-09-04 (×3): qty 2

## 2020-09-04 MED ORDER — OXYTOCIN-SODIUM CHLORIDE 30-0.9 UT/500ML-% IV SOLN
2.5000 [IU]/h | INTRAVENOUS | Status: DC
  Filled 2020-09-04: qty 500

## 2020-09-04 MED ORDER — BENZOCAINE-MENTHOL 20-0.5 % EX AERO: 1.0000 "application " | INHALATION_SPRAY | CUTANEOUS | Status: DC | PRN

## 2020-09-04 MED ORDER — OXYTOCIN-SODIUM CHLORIDE 30-0.9 UT/500ML-% IV SOLN: 2.5000 [IU]/h | INTRAVENOUS | Status: DC

## 2020-09-04 MED ORDER — DIBUCAINE (PERIANAL) 1 % EX OINT: 1.0000 "application " | TOPICAL_OINTMENT | CUTANEOUS | Status: DC | PRN

## 2020-09-04 MED ORDER — COCONUT OIL OIL: 1.0000 "application " | TOPICAL_OIL | Status: DC | PRN

## 2020-09-04 MED ORDER — ACETAMINOPHEN 325 MG PO TABS
650.0000 mg | ORAL_TABLET | ORAL | Status: DC | PRN
  Administered 2020-09-05 (×2): 650 mg via ORAL
  Filled 2020-09-04 (×3): qty 2

## 2020-09-04 NOTE — Anesthesia Preprocedure Evaluation (Signed)
Anesthesia Evaluation  Patient identified by MRN, date of birth, ID band Patient awake    Reviewed: Allergy & Precautions, Patient's Chart, lab work & pertinent test results  Airway Mallampati: II  TM Distance: >3 FB     Dental   Pulmonary Patient abstained from smoking., former smoker,    Pulmonary exam normal        Cardiovascular negative cardio ROS Normal cardiovascular exam     Neuro/Psych PSYCHIATRIC DISORDERS negative neurological ROS     GI/Hepatic negative GI ROS, Neg liver ROS,   Endo/Other  negative endocrine ROS  Renal/GU negative Renal ROS     Musculoskeletal   Abdominal   Peds  Hematology  (+) anemia ,   Anesthesia Other Findings   Reproductive/Obstetrics (+) Pregnancy                             Lab Results  Component Value Date   WBC 11.2 (H) 09/04/2020   HGB 9.8 (L) 09/04/2020   HCT 28.9 (L) 09/04/2020   MCV 83.8 09/04/2020   PLT 170 09/04/2020   Lab Results  Component Value Date   CREATININE 0.71 10/16/2019   BUN 11 10/16/2019   NA 137 10/16/2019   K 3.6 10/16/2019   CL 107 10/16/2019   CO2 22 10/16/2019    Anesthesia Physical Anesthesia Plan  ASA: II  Anesthesia Plan: Epidural   Post-op Pain Management:    Induction:   PONV Risk Score and Plan: Treatment may vary due to age or medical condition  Airway Management Planned: Natural Airway  Additional Equipment:   Intra-op Plan:   Post-operative Plan:   Informed Consent: I have reviewed the patients History and Physical, chart, labs and discussed the procedure including the risks, benefits and alternatives for the proposed anesthesia with the patient or authorized representative who has indicated his/her understanding and acceptance.       Plan Discussed with:   Anesthesia Plan Comments:         Anesthesia Quick Evaluation

## 2020-09-04 NOTE — Anesthesia Procedure Notes (Signed)
Epidural Patient location during procedure: OB Start time: 09/04/2020 3:47 PM End time: 09/04/2020 3:54 PM  Staffing Anesthesiologist: Marcene Duos, MD Performed: anesthesiologist   Preanesthetic Checklist Completed: patient identified, IV checked, site marked, risks and benefits discussed, surgical consent, monitors and equipment checked, pre-op evaluation and timeout performed  Epidural Patient position: sitting Prep: DuraPrep and site prepped and draped Patient monitoring: continuous pulse ox and blood pressure Approach: midline Location: L4-L5 Injection technique: LOR air  Needle:  Needle type: Tuohy  Needle gauge: 17 G Needle length: 9 cm and 9 Needle insertion depth: 6 cm Catheter type: closed end flexible Catheter size: 19 Gauge Catheter at skin depth: 11 cm Test dose: negative  Assessment Events: blood not aspirated, injection not painful, no injection resistance, no paresthesia and negative IV test

## 2020-09-04 NOTE — H&P (Signed)
Amanda Russell is a 24 y.o. female presenting for labor  24 year old gravida 4 para 1-1-1-2 at 39+2 weeks presents to maternity admissions complaining of painful contractions and was found to be in advanced labor.  Upon admission she was 8 cm dilated painfully contracting.  Her pregnancy has been complicated by group B strep carrier status with a positive urine culture in the first trimester.  She also had chlamydia and trichomonas infections in the first trimester.  She has a prior pregnancy affected by preterm premature rupture of membranes at 32 weeks.  She was recommended to use vaginal progesterone suppositories for her history of preterm delivery by maternal-fetal medicine.   OB History    Gravida  4   Para  2   Term  1   Preterm  1   AB  1   Living  2     SAB      TAB      Ectopic  1   Multiple      Live Births  2          Past Medical History:  Diagnosis Date  . ADHD (attention deficit hyperactivity disorder), combined type 07/14/2012  . Chlamydia   . Preterm premature rupture of membranes (PPROM) with unknown onset of labor 07/13/2014  . Suicidal ideation    Past Surgical History:  Procedure Laterality Date  . LAPAROSCOPY Left 10/16/2019   Procedure: LAPAROSCOPIC LEFT SALPINGECTOMY;  Surgeon: [place] Bing, MD;  Location: [facility];  Service: Gynecology;  Laterality: Left;  . NO PAST SURGERIES     Family History: family history includes Hypertension in an other family member. Social History:  reports that she quit smoking about 9 years ago. Her smoking use included cigarettes. She has a 0.40 pack-year smoking history. She has never used smokeless tobacco. She reports previous alcohol use. She reports previous drug use. Frequency: 7.00 times per week. Drug: Marijuana.     Maternal Diabetes: No Genetic Screening: Normal Maternal Ultrasounds/Referrals: Normal Fetal Ultrasounds or other Referrals:  None Maternal Substance Abuse:  Yes:  Type: Smoker,  Marijuana Significant Maternal Medications:  None Significant Maternal Lab Results:  Group B Strep positive Other Comments:  None  Review of Systems History   unknown if currently breastfeeding. Exam Physical Exam  Prenatal labs: ABO, Rh: --/--/B POS, B POS Performed at [facility], [address], [place], [place] [address]  [phone]) Antibody: NEG (01/15 1021) Rubella:  Imm RPR:   NR HBsAg:   Neg HIV: Non Reactive (05/19 2324)  GBS:   Pos   Assessment/Plan: 1) multiparous patient in advanced labor.  Will attempt to get ampicillin antibiotics on board prior to delivery. 2) patient may have a epidural if it can be placed in time.  IV fentanyl is available. 3) anticipate spontaneous vaginal delivery   Waynard Reeds 09/04/2020, 1:52 PM

## 2020-09-04 NOTE — Progress Notes (Signed)
Pt denies any hx of psychological disorders,but in the chart , there are episodes of visits to [facility], [facility], for [facility] evaluation.

## 2020-09-04 NOTE — MAU Note (Signed)
Pt brought directly back to room 120, pt complaining of urge to push from the lobby. States some leaking of fluid. Sharen Counter CNM to bedside. SVE pt is 8cm/0 station with BBOW. FHT 160's.  RN called Dr Tenny Craw and obtained verbal orders for admission. Pt to room 217 via bed with Misty Stanley CNM in attendance.

## 2020-09-05 LAB — RPR: RPR Ser Ql: NONREACTIVE

## 2020-09-05 LAB — CBC
HCT: 25.3 % — ABNORMAL LOW (ref 36.0–46.0)
Hemoglobin: 8.6 g/dL — ABNORMAL LOW (ref 12.0–15.0)
MCH: 28.7 pg (ref 26.0–34.0)
MCHC: 34 g/dL (ref 30.0–36.0)
MCV: 84.3 fL (ref 80.0–100.0)
Platelets: 166 10*3/uL (ref 150–400)
RBC: 3 MIL/uL — ABNORMAL LOW (ref 3.87–5.11)
RDW: 12.8 % (ref 11.5–15.5)
WBC: 14.5 10*3/uL — ABNORMAL HIGH (ref 4.0–10.5)
nRBC: 0 % (ref 0.0–0.2)

## 2020-09-05 MED ORDER — INFLUENZA VAC SPLIT QUAD 0.5 ML IM SUSY: 0.5000 mL | PREFILLED_SYRINGE | INTRAMUSCULAR | Status: DC

## 2020-09-05 NOTE — Clinical Social Work Maternal (Signed)
CLINICAL SOCIAL WORK MATERNAL/CHILD NOTE  Patient Details  Name: Amanda Russell MRN: [id number] Date of Birth: 10-11-1995  Date:  09/05/2020  Clinical Social Worker Initiating Note:  Darra Lis, MSW, [place] Date/Time: Initiated:  09/05/20/0945     Child's Name:  Riki Sheer   Biological Parents:  Mother   Need for Interpreter:  None   Reason for Referral:  Behavioral Health Concerns   Address:  [place] [address]    Phone number:  [phone] (home)     Additional phone number:   Household Members/Support Persons (HM/SP):   Household Member/Support Person 1, Household Member/Support Person 2   HM/SP Name Relationship DOB or Age  HM/SP -1 Kerby Moors 10/13/2011  HM/SP -2 Colon Branch 07/13/2014  HM/SP -3        HM/SP -4        HM/SP -5        HM/SP -6        HM/SP -7        HM/SP -8          Natural Supports (not living in the home):  Immediate Family   Professional Supports: None   Employment: Full-time   Type of Work: [employer] 6   Education:  [place] arranged:    Museum/gallery curator Resources:  [employer]   Other Resources:  [employer], Physicist, medical    Cultural/Religious Considerations Which May Impact Care:    Strengths:  Ability to meet basic needs , Engineer, materials, Home prepared for child    Psychotropic Medications:         Pediatrician:    [facility]olicitor area  Pediatrician List:   [place] [facility]      Pediatrician Fax Number:    Risk Factors/Current Problems:  Mental Health Concerns    Cognitive State:  Alert    Mood/Affect:  Calm , Interested    CSW Assessment: CSW consulted for THC use, ADHD, Bipolar, Depression-SI, Psychosis, Anxiety, & ODD. CSW met with MOB to complete assessment and to offer support. CSW observed MOB in bed holding newborn and FOB on couch. CSW  introduced self and role. CSW asked MOB to speak alone to respect privacy. FOB was agreeable and exited the room. CSW informed MOB of the reason for consult. MOB expressed understanding and immediately stated she has no mental health diagnosis. CSW asked MOB about previous hospitalizations and noted psychosis. MOB stated it is inaccurate and reiterated she has never been hospitalized. CSW asked MOB if she has ever has SI in the past, MOB stated no. MOB went on to state she is not on any medications and has never been on medications. MOB stated the only time she has been to therapy is in 2013 with her mother. MOB denied any current SI, HI or DV. MOB appeared to become tearful during assessment and attributed it to cramping.   CSW asked MOB of any substance history. MOB reported she did not use any substances during or prior to pregnancy. MOB reported her last THC use being more than a year ago. MOB had no documented substance use after initial prenatal visit and no positive drug screens after initial prenatal visit. Baby's UDS is negative. CSW asked MOB if she has any CPS history, MOB stated no which contradicts what is noted in  the chart. Previous records note DSS involvement. CSW contacted CPS for clarification.  CSW provided education regarding the baby blues period vs. perinatal mood disorders, discussed treatment and gave resources for mental health follow up if concerns arise.  CSW recommends self-evaluation during the postpartum time period using the New Mom Checklist from Postpartum Progress and encouraged MOB to contact a medical professional if symptoms are noted at any time. MOB reported she has never experienced PPD with her previous children and was receptive to the information. CSW provided review of Sudden Infant Death Syndrome (SIDS) precautions. MOB reported newborn will sleep in a bassinet. MOB stated she has all of the essential items for newborn, including a car seat. MOB identified [place] for follow-up care and denies any transportation barriers. MOB declined referrals to community resources.  CSW will continue to monitor CDS and make a CPS if warranted. Barriers to discharge, pending confirmation of no CPS involvement.  CSW Plan/Description:  Perinatal Mood and Anxiety Disorder (PMADs) Education, Sudden Infant Death Syndrome (SIDS) Education, CSW Will Continue to Monitor Umbilical Cord Tissue Drug Screen Results and Make Report if Warranted, [place], [place] 09/05/2020, 10:44 AM

## 2020-09-05 NOTE — Anesthesia Postprocedure Evaluation (Signed)
Anesthesia Post Note  Patient: Amanda Russell  Procedure(s) Performed: AN AD HOC LABOR EPIDURAL     Patient location during evaluation: Mother Baby Anesthesia Type: Epidural Level of consciousness: awake and alert Pain management: pain level controlled Vital Signs Assessment: post-procedure vital signs reviewed and stable Respiratory status: spontaneous breathing, nonlabored ventilation and respiratory function stable Cardiovascular status: stable Postop Assessment: no headache, no backache, no apparent nausea or vomiting, patient able to bend at knees, adequate PO intake and able to ambulate Anesthetic complications: no   No complications documented.  Last Vitals:  Vitals:   09/05/20 0830 09/05/20 1530  BP: 111/68 113/81  Pulse: 70 86  Resp: 19 17  Temp: 36.9 C 36.8 C  SpO2: 100% 100%    Last Pain:  Vitals:   09/05/20 1530  TempSrc: Oral  PainSc:    Pain Goal: Patients Stated Pain Goal: 2 (09/05/20 0549)                 Blythe Stanford

## 2020-09-05 NOTE — Progress Notes (Signed)
Patient is doing well.  She is ambulating, voiding, tolerating PO.  Pain control is good.  Lochia is appropriate  Vitals:   09/04/20 2250 09/05/20 0305 09/05/20 0549 09/05/20 0830  BP: (!) 103/52 100/61 97/61 111/68  Pulse: 70 79 81 70  Resp: 18 18 18 19   Temp: 98.3 F (36.8 C) 98.5 F (36.9 C) 98.2 F (36.8 C) 98.4 F (36.9 C)  TempSrc: Oral Oral Oral Oral  SpO2: 100% 100% 100% 100%    NAD Fundus firm Ext: no edema  Lab Results  Component Value Date   WBC 14.5 (H) 09/05/2020   HGB 8.6 (L) 09/05/2020   HCT 25.3 (L) 09/05/2020   MCV 84.3 09/05/2020   PLT 166 09/05/2020    --/--/B POS (12/05 1359)/RImmune  A/P 24 y.o. 04-28-1982 PPD# s/p TSVD. Routine care.   History of anxiety / depression (hospitalized at [facility] for PPD /SI after G1).  Not on any meds currently.  SW consult pending Desires newborn circumcision.  Awaiting nursery clearance  [facility]

## 2020-09-06 MED ORDER — IBUPROFEN 600 MG PO TABS: 600.0000 mg | ORAL_TABLET | Freq: Four times a day (QID) | ORAL | 1 refills | Status: DC | PRN

## 2020-09-06 MED ORDER — FERROUS SULFATE 325 (65 FE) MG PO TABS
325.0000 mg | ORAL_TABLET | Freq: Every day | ORAL | 3 refills | Status: DC
Start: 2020-09-07 — End: 2021-08-03

## 2020-09-06 MED ORDER — FERROUS SULFATE 325 (65 FE) MG PO TABS
325.0000 mg | ORAL_TABLET | Freq: Every day | ORAL | Status: DC
  Administered 2020-09-06: 325 mg via ORAL
  Filled 2020-09-06: qty 1

## 2020-09-06 MED ORDER — ACETAMINOPHEN 325 MG PO TABS
650.0000 mg | ORAL_TABLET | ORAL | 1 refills | Status: DC | PRN
Start: 2020-09-06 — End: 2021-10-11

## 2020-09-06 NOTE — Social Work (Addendum)
CSW spoke with [place] and was informed MOB has an open foster care case for her two other children.    CSW made a new report for newborn.   CSW contacted foster care SW and left a voicemail. Currently awaiting a call back.  CSW met with MOB and observed FOB present in the room. Upon entry, MOB stated she was told she is unable to leave until CPS makes contact. CSW confirmed the information and agreed to keep MOB updated on any new information.   Barriers to discharge. Active foster care case.   Darra Lis, MSW, [place] [employer] [phone]

## 2020-09-06 NOTE — Progress Notes (Signed)
Post Partum Day 2 Subjective: Amanda Russell is doing well this morning without complains. She is ambulating, voiding, tolerating PO. Minimal lochia. She is eager for discharge home.    Objective: Patient Vitals for the past 24 hrs:  BP Temp Temp src Pulse Resp SpO2  09/06/20 0637 99/65 97.8 F (36.6 C) Oral 65 18 -  09/05/20 2156 120/61 98.1 F (36.7 C) Oral 72 18 -  09/05/20 1530 113/81 98.3 F (36.8 C) Oral 86 17 100 %    Physical Exam:  General: alert, cooperative and no distress Lochia: appropriate Uterine Fundus: firm DVT Evaluation: No evidence of DVT seen on physical exam.  Recent Labs    09/04/20 1359 09/05/20 0540  WBC 11.2* 14.5*  HGB 9.8* 8.6*  HCT 28.9* 25.3*  PLT 170 166    No results for input(s): NA, K, CL, CO2CT, BUN, CREATININE, GLUCOSE, BILITOT, ALT, AST, ALKPHOS, PROT, ALBUMIN in the last 72 hours.  No results for input(s): CALCIUM, MG, PHOS in the last 72 hours.  No results for input(s): PROTIME, APTT, INR in the last 72 hours.  No results for input(s): PROTIME, APTT, INR, FIBRINOGEN in the last 72 hours. Assessment/Plan:  Amanda Russell 24 y.o. [id number] PPD#2 sp SVD 1. PPC: continue routine postpartum care 2. Anemia: asymptomatic, continue daily iron 3. Desires neonatal circumcision, R/B/A of procedure discussed at length. Pt understands that neonatal circumcision is not considered medically necessary and is elective. The risks include, but are not limited to bleeding, infection, damage to the penis, development of scar tissue, and having to have it redone at a later date. Pt understands theses risks and wishes to proceed.  4. Social work consult obtained yesterday: CPS confirmed her 2 other children are in foster care and opened CPS case for this child. CPS planning to see patient within 24 hours.  5. Dispo: stable for discharge, awaiting result of CPS consult today. If cleared, will d/c home with baby. If not, will discuss with patient boarding vs.  D/c home tomorrow  Rh pos, rubella immune, tdap given prenatally   LOS: 2 days   Charlett Nose 09/06/2020, 11:20 AM

## 2020-09-06 NOTE — Social Work (Addendum)
10:15a CSW informed by [place] CPS that a CPS SW will be making contact with MOB within 24 hours to come up with a discharge plan. CSW provided update to MOB and MD.   11:35a CSW escorted CPS SW K. Luck to MOB room for assessment.   12:45p CSW informed by CPS SW K. Luck that an immediate Child & Family Team Meeting is needed to determine discharge plan for newborn. Disposition pending CFT meeting.  3:15p CSW attended CFT meeting with SW Supervisor G. Spinks, CPS SW K. Luck, Foster Care SW M. Harris, SW Supervisor S. Edwards & Facilitator R. Oboh.   CPS still determining discharge plan for baby. Mob & FOB are able to room in with baby until a CPS plan is made. CSW updated MD and RN.  Barriers to discharge, awaiting CPS disposition.  Chandlor Noecker, MSW, LCSWA Clinical Social Work [facility] [phone] 

## 2020-09-06 NOTE — Discharge Summary (Signed)
Postpartum Discharge Summary    Patient Name: Amanda Russell DOB: 02/08/1996 MRN: [id number]  Date of admission: 09/04/2020 Delivery date:09/04/2020  Delivering provider: Vanessa Kick  Date of discharge: 09/06/2020  Admitting diagnosis: Normal labor and delivery [O80] Normal labor [O80, Z37.9] Spontaneous vaginal delivery [O80] Intrauterine pregnancy: [redacted]w[redacted]d    Secondary diagnosis:  Active Problems:   Normal labor   Normal labor and delivery   Spontaneous vaginal delivery  Additional problems: Psychosocial issues    Discharge diagnosis: Term Pregnancy Delivered                                              Post partum procedures:none Augmentation: N/A Complications: None  Hospital course: Onset of Labor With Vaginal Delivery      24y.o. yo [id number] 330w2das admitted in Active Labor on 09/04/2020. Patient had an uncomplicated labor course as follows:  Membrane Rupture Time/Date: 3:12 PM ,09/04/2020   Delivery Method:Vaginal, Spontaneous  Episiotomy: None  Lacerations:  None  Patient had an uncomplicated postpartum course.  She is ambulating, tolerating a regular diet, passing flatus, and urinating well. Social work consulted due to behavioral health concerns and discovered patient's other 2 children are in foster care. CPS was contacted. At time of this note, CPS has not yet confirmed disposition of baby but okay with mom rooming in.  Patient is discharged in stable condition on 09/06/20.  Newborn Data: Birth date:09/04/2020  Birth time:7:41 PM  Gender:Female  Living status:Living  Apgars:8 ,9  Weight:3025 g   Magnesium Sulfate received: No BMZ received: No Rhophylac:N/A MMR:N/A T-DaP:Given prenatally Flu: N/A Transfusion:No  Physical exam  Vitals:   09/05/20 0830 09/05/20 1530 09/05/20 2156 09/06/20 0637  BP: 111/68 113/81 120/61 99/65  Pulse: 70 86 72 65  Resp: 19 17 18 18   Temp: 98.4 F (36.9 C) 98.3 F (36.8 C) 98.1 F (36.7 C) 97.8 F (36.6 C)   TempSrc: Oral Oral Oral Oral  SpO2: 100% 100%     General: alert, cooperative and no distress Lochia: appropriate Uterine Fundus: firm Incision: N/A DVT Evaluation: No evidence of DVT seen on physical exam. Labs: Lab Results  Component Value Date   WBC 14.5 (H) 09/05/2020   HGB 8.6 (L) 09/05/2020   HCT 25.3 (L) 09/05/2020   MCV 84.3 09/05/2020   PLT 166 09/05/2020   CMP Latest Ref Rng & Units 10/16/2019  Glucose 70 - 99 mg/dL 115(H)  BUN 6 - 20 mg/dL 11  Creatinine 0.44 - 1.00 mg/dL 0.71  Sodium 135 - 145 mmol/L 137  Potassium 3.5 - 5.1 mmol/L 3.6  Chloride 98 - 111 mmol/L 107  CO2 22 - 32 mmol/L 22  Calcium 8.9 - 10.3 mg/dL 9.5  Total Protein 6.5 - 8.1 g/dL 8.3(H)  Total Bilirubin 0.3 - 1.2 mg/dL 1.3(H)  Alkaline Phos 38 - 126 U/L 52  AST 15 - 41 U/L 20  ALT 0 - 44 U/L 17   Edinburgh Score: Edinburgh Postnatal Depression Scale Screening Tool 09/05/2020  I have been able to laugh and see the funny side of things. 0  I have looked forward with enjoyment to things. 0  I have blamed myself unnecessarily when things went wrong. 0  I have been anxious or worried for no good reason. 0  I have felt scared or panicky for no good reason. 0  Things have been getting on top of me. 0  I have been so unhappy that I have had difficulty sleeping. 0  I have felt sad or miserable. 1  I have been so unhappy that I have been crying. 0  The thought of harming myself has occurred to me. 0  Edinburgh Postnatal Depression Scale Total 1      After visit meds:  Allergies as of 09/06/2020      Reactions   Pollen Extract Itching, Cough      Medication List    STOP taking these medications   cyclobenzaprine 10 MG tablet Commonly known as: FLEXERIL   prenatal multivitamin Tabs tablet     TAKE these medications   acetaminophen 325 MG tablet Commonly known as: Tylenol Take 2 tablets (650 mg total) by mouth every 4 (four) hours as needed (for pain scale < 4).   ferrous sulfate 325  (65 FE) MG tablet Take 1 tablet (325 mg total) by mouth daily with breakfast. Start taking on: September 07, 2020   ibuprofen 600 MG tablet Commonly known as: ADVIL Take 1 tablet (600 mg total) by mouth every 6 (six) hours as needed for cramping.        Discharge home in stable condition Infant Feeding: Bottle Infant Disposition:pending CPS Discharge instruction: per After Visit Summary and Postpartum booklet. Activity: Advance as tolerated. Pelvic rest for 6 weeks.  Diet: routine diet Anticipated Birth Control: Unsure Postpartum Appointment:4 weeks Additional Postpartum F/U: none Future Appointments:No future appointments. Follow up Visit:  Follow-up Information    Vanessa Kick, MD Follow up in 4 week(s).   Specialty: Obstetrics and Gynecology Contact information: [place] [address] [phone]                   09/06/2020 Rowland Lathe, MD

## 2020-09-07 ENCOUNTER — Other Ambulatory Visit (HOSPITAL_COMMUNITY): Payer: Medicaid Other

## 2020-09-09 ENCOUNTER — Inpatient Hospital Stay (HOSPITAL_COMMUNITY)
Admission: AD | Admit: 2020-09-09 | Payer: Medicaid Other | Source: Home / Self Care | Admitting: Obstetrics & Gynecology

## 2020-09-09 ENCOUNTER — Inpatient Hospital Stay (HOSPITAL_COMMUNITY): Payer: Medicaid Other

## 2020-10-01 DIAGNOSIS — I10 Essential (primary) hypertension: Secondary | ICD-10-CM

## 2020-10-01 HISTORY — DX: Essential (primary) hypertension: I10

## 2020-10-01 NOTE — L&D Delivery Note (Signed)
LABOR COURSE CNM in MAU. Emergency call received from [place]. Patient in lobby, in wheelchair, endorsing labor and intense rectal pressure. Assisted to MAU 1S29. Checked by CNM, 7-8cm, no detectable amniotic sac. Pt reports leaking of fluid since 0500 this morning. CNM and Reather Converse, RN accompanied patient to L&D 2S15. Patient met by L&D staff. She delivered a very short time later.   Delivery Note Head delivered OA. No nuchal cord present. Shoulder and body delivered in usual fashion. At  75 a viable female was delivered via Vaginal, Spontaneous (Presentation: direct ).  Infant with spontaneous cry, placed on mother's abdomen, dried and stimulated. Cord clamped x 2 after 90 second delay, and cut by patient. Cord blood drawn. Placenta delivered spontaneously with gentle cord traction. Appears intact. Fundus firm with massage and Pitocin. Labia, perineum, vagina, and cervix inspected.   APGAR:8, 9; weight: 2080g .   Cord: 3VC     Anesthesia: None  Episiotomy: None Lacerations: None Est. Blood Loss (mL): 50  Mom to postpartum (OBSC).  Baby to NICU.  Mallie Snooks, CNM 09/09/21 3:41 PM

## 2021-03-14 ENCOUNTER — Other Ambulatory Visit: Payer: Self-pay

## 2021-03-14 ENCOUNTER — Emergency Department (HOSPITAL_COMMUNITY)
Admission: EM | Admit: 2021-03-14 | Discharge: 2021-03-15 | Disposition: A | Discharge status: Home / Self Care | Payer: Medicaid Other | Attending: Emergency Medicine | Admitting: Emergency Medicine

## 2021-03-14 ENCOUNTER — Encounter (HOSPITAL_COMMUNITY): Payer: Self-pay

## 2021-03-14 DIAGNOSIS — Z3201 Encounter for pregnancy test, result positive: Secondary | ICD-10-CM

## 2021-03-14 DIAGNOSIS — Z87891 Personal history of nicotine dependence: Secondary | ICD-10-CM | POA: Diagnosis not present

## 2021-03-14 DIAGNOSIS — O468X1 Other antepartum hemorrhage, first trimester: Secondary | ICD-10-CM

## 2021-03-14 DIAGNOSIS — A599 Trichomoniasis, unspecified: Secondary | ICD-10-CM

## 2021-03-14 DIAGNOSIS — Z3A08 8 weeks gestation of pregnancy: Secondary | ICD-10-CM | POA: Diagnosis not present

## 2021-03-14 DIAGNOSIS — O23591 Infection of other part of genital tract in pregnancy, first trimester: Secondary | ICD-10-CM | POA: Diagnosis not present

## 2021-03-14 DIAGNOSIS — O4691 Antepartum hemorrhage, unspecified, first trimester: Secondary | ICD-10-CM | POA: Insufficient documentation

## 2021-03-14 LAB — CBC WITH DIFFERENTIAL/PLATELET
Abs Immature Granulocytes: 0.03 10*3/uL (ref 0.00–0.07)
Basophils Absolute: 0 10*3/uL (ref 0.0–0.1)
Basophils Relative: 0 %
Eosinophils Absolute: 0.1 10*3/uL (ref 0.0–0.5)
Eosinophils Relative: 1 %
HCT: 30.7 % — ABNORMAL LOW (ref 36.0–46.0)
Hemoglobin: 10 g/dL — ABNORMAL LOW (ref 12.0–15.0)
Immature Granulocytes: 0 %
Lymphocytes Relative: 28 %
Lymphs Abs: 2.5 10*3/uL (ref 0.7–4.0)
MCH: 27 pg (ref 26.0–34.0)
MCHC: 32.6 g/dL (ref 30.0–36.0)
MCV: 83 fL (ref 80.0–100.0)
Monocytes Absolute: 0.6 10*3/uL (ref 0.1–1.0)
Monocytes Relative: 7 %
Neutro Abs: 5.4 10*3/uL (ref 1.7–7.7)
Neutrophils Relative %: 64 %
Platelets: 248 10*3/uL (ref 150–400)
RBC: 3.7 MIL/uL — ABNORMAL LOW (ref 3.87–5.11)
RDW: 14.9 % (ref 11.5–15.5)
WBC: 8.7 10*3/uL (ref 4.0–10.5)
nRBC: 0 % (ref 0.0–0.2)

## 2021-03-14 LAB — COMPREHENSIVE METABOLIC PANEL
ALT: 14 U/L (ref 0–44)
AST: 16 U/L (ref 15–41)
Albumin: 4.2 g/dL (ref 3.5–5.0)
Alkaline Phosphatase: 63 U/L (ref 38–126)
Anion gap: 11 (ref 5–15)
BUN: 5 mg/dL — ABNORMAL LOW (ref 6–20)
CO2: 22 mmol/L (ref 22–32)
Calcium: 9.2 mg/dL (ref 8.9–10.3)
Chloride: 102 mmol/L (ref 98–111)
Creatinine, Ser: 0.73 mg/dL (ref 0.44–1.00)
GFR, Estimated: >60 mL/min (ref >60)
Glucose, Bld: 79 mg/dL (ref 70–99)
Potassium: 3.6 mmol/L (ref 3.5–5.1)
Sodium: 135 mmol/L (ref 135–145)
Total Bilirubin: 0.4 mg/dL (ref 0.3–1.2)
Total Protein: 7.7 g/dL (ref 6.5–8.1)

## 2021-03-14 LAB — LIPASE, BLOOD: Lipase: 28 U/L (ref 11–51)

## 2021-03-14 NOTE — ED Provider Notes (Signed)
Emergency Medicine Provider Triage Evaluation Note  Amanda Russell , a 25 y.o. female  was evaluated in triage.  Pt complains of feeling unwell for the past 2 to 3 days, generalized abdominal discomfort with nausea, vomiting, loss of appetite.  Denies dysuria or frequency, reports decreased urinary output.  Denies constipation or diarrhea.  Patient states last menstrual cycle was lighter than usual, lasting only 2 days, occurred 3 weeks ago, does not believe she is pregnant..  Review of Systems  Positive: Abdominal pain, vomiting, loss of appetite Negative: Changes in bowel or bladder habits  Physical Exam  There were no vitals taken for this visit. Gen:   Awake, no distress   Resp:  Normal effort  MSK:   Moves extremities without difficulty  Other:  Abdomen is soft and nontender.  Medical Decision Making  Medically screening exam initiated at 8:58 PM.  Appropriate orders placed.  TEMIMA KUTSCH was informed that the remainder of the evaluation will be completed by another provider, this initial triage assessment does not replace that evaluation, and the importance of remaining in the ED until their evaluation is complete.     Jeannie Fend, PA-C 03/14/21 2102    Lorre Nick, MD 03/16/21 1331

## 2021-03-14 NOTE — ED Provider Notes (Signed)
[facility]-EMERGENCY DEPT Provider Note   CSN: [id number] Arrival date & time: 03/14/21  2037     History Chief Complaint  Patient presents with   Emesis    Amanda Russell is a 25 y.o. female with a hx of bipolar disorder, ADHD, & prior STI who presents to the ED with complaints of abdominal pain over the past 1 week. Patient states pain is to the lower abdomen, intermittent, no alleviating/aggravating factors, has had associated nausea with variable amounts of emesis per day, usually 1-2 episodes, and associated intermittent vaginal bleeding/spotting. Last BM yesterday and was normal. Denies fever, chills, chest pain, dyspnea, dysuria, or vaginal discharge. LMP 3 weeks ago. Denies atypical discharge.   HPI     Past Medical History:  Diagnosis Date   ADHD (attention deficit hyperactivity disorder), combined type 07/14/2012   Chlamydia    Preterm premature rupture of membranes (PPROM) with unknown onset of labor 07/13/2014   Suicidal ideation     Patient Active Problem List   Diagnosis Date Noted   Normal labor 09/04/2020   Normal labor and delivery 09/04/2020   Spontaneous vaginal delivery 09/04/2020   Trichomoniasis 03/10/2020   Chlamydia infection affecting pregnancy 02/19/2020   Chlamydia 10/20/2019   Left tubal pregnancy 10/16/2019   Acute psychosis (HCC) 03/15/2019   Marijuana abuse 08/03/2018   Tobacco dependence 08/03/2018   Bipolar disorder, current episode manic severe with psychotic features (HCC) 08/02/2018   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) 06/10/2017   Suicidal ideation 07/14/2012   Depression 07/14/2012   ADHD (attention deficit hyperactivity disorder), combined type 07/14/2012   Oppositional defiant disorder 07/14/2012   Parent-child relational problem 07/14/2012    Past Surgical History:  Procedure Laterality Date   LAPAROSCOPY Left 10/16/2019   Procedure: LAPAROSCOPIC LEFT SALPINGECTOMY;  Surgeon:  [place] Bing, MD;  Location: [facility];  Service: Gynecology;  Laterality: Left;   NO PAST SURGERIES       OB History     Gravida  4   Para  3   Term  2   Preterm  1   AB  1   Living  3      SAB      IAB      Ectopic  1   Multiple  0   Live Births  3           Family History  Problem Relation Age of Onset   Hypertension Other    Cancer Neg Hx    Diabetes Neg Hx     Social History   Tobacco Use   Smoking status: Former    Packs/day: 0.50    Years: 0.80    Pack years: 0.40    Types: Cigarettes    Quit date: 01/07/2011    Years since quitting: 10.1   Smokeless tobacco: Never  Vaping Use   Vaping Use: Never used  Substance Use Topics   Alcohol use: Not Currently    Comment: socially   Drug use: Not Currently    Frequency: 7.0 times per week    Types: Marijuana    Comment: denies pt states it has been years 02/18/20    Home Medications Prior to Admission medications   Medication Sig Start Date End Date Taking? Authorizing Provider  acetaminophen (TYLENOL) 325 MG tablet Take 2 tablets (650 mg total) by mouth every 4 (four) hours as needed (for pain scale < 4). 09/06/20   Charlett Nose, MD  ferrous  sulfate 325 (65 FE) MG tablet Take 1 tablet (325 mg total) by mouth daily with breakfast. 09/07/20   Charlett NoseMarinone, Michelle E, MD  ibuprofen (ADVIL) 600 MG tablet Take 1 tablet (600 mg total) by mouth every 6 (six) hours as needed for cramping. 09/06/20   Charlett NoseMarinone, Michelle E, MD    Allergies    Pollen extract  Review of Systems   Review of Systems  Constitutional:  Negative for chills and fever.  Respiratory:  Negative for shortness of breath.   Cardiovascular:  Negative for chest pain.  Gastrointestinal:  Positive for abdominal pain, nausea and vomiting. Negative for blood in stool, constipation and diarrhea.  Genitourinary:  Positive for vaginal bleeding. Negative for dysuria and vaginal discharge.  Neurological:  Negative for syncope.  All other  systems reviewed and are negative.  Physical Exam Updated Vital Signs BP 134/65   Pulse 67   Temp 97.7 F (36.5 C) (Oral)   Resp 17   Ht 5\' 5"  (1.651 m)   Wt 59 kg   LMP 03/04/2021 (Within Days)   SpO2 96%   BMI 21.63 kg/m   Physical Exam Vitals and nursing note reviewed.  Constitutional:      General: She is not in acute distress.    Appearance: She is well-developed. She is not toxic-appearing.  HENT:     Head: Normocephalic and atraumatic.  Eyes:     General:        Right eye: No discharge.        Left eye: No discharge.     Conjunctiva/sclera: Conjunctivae normal.  Cardiovascular:     Rate and Rhythm: Normal rate and regular rhythm.  Pulmonary:     Effort: Pulmonary effort is normal. No respiratory distress.     Breath sounds: Normal breath sounds. No wheezing, rhonchi or rales.  Abdominal:     General: There is no distension.     Palpations: Abdomen is soft.     Tenderness: There is no abdominal tenderness.  Genitourinary:    Comments: Chaperone present. No bleeding, mild discharge present. NO CMT or adnexal tenderness.  Musculoskeletal:     Cervical back: Neck supple.  Skin:    General: Skin is warm and dry.     Findings: No rash.  Neurological:     Mental Status: She is alert.     Comments: Clear speech.   Psychiatric:        Behavior: Behavior normal.    ED Results / Procedures / Treatments   Labs (all labs ordered are listed, but only abnormal results are displayed) Labs Reviewed  WET PREP, GENITAL - Abnormal; Notable for the following components:      Result Value   Trich, Wet Prep PRESENT (*)    Clue Cells Wet Prep HPF POC PRESENT (*)    WBC, Wet Prep HPF POC PRESENT (*)    All other components within normal limits  CBC WITH DIFFERENTIAL/PLATELET - Abnormal; Notable for the following components:   RBC 3.70 (*)    Hemoglobin 10.0 (*)    HCT 30.7 (*)    All other components within normal limits  COMPREHENSIVE METABOLIC PANEL - Abnormal;  Notable for the following components:   BUN 5 (*)    All other components within normal limits  URINALYSIS, ROUTINE W REFLEX MICROSCOPIC - Abnormal; Notable for the following components:   APPearance HAZY (*)    Ketones, ur 5 (*)    Protein, ur 30 (*)    Leukocytes,Ua SMALL (*)  Bacteria, UA RARE (*)    All other components within normal limits  HCG, QUANTITATIVE, PREGNANCY - Abnormal; Notable for the following components:   hCG, Beta Chain, Quant, S 06,237 (*)    All other components within normal limits  URINE CULTURE  LIPASE, BLOOD  ABO/RH  GC/CHLAMYDIA PROBE AMP ([place]) NOT AT [facility]    EKG None  Radiology US OB Comp < 14 Wks  Result Date: 03/15/2021 CLINICAL DATA:  Pelvic pain for 3 days. Quantitative beta HCG is 92,387. LMP was 03/04/2021. EXAM: OBSTETRIC <14 WK [place] AND TRANSVAGINAL OB US TECHNIQUE: Both transabdominal and transvaginal ultrasound examinations were performed for complete evaluation of the gestation as well as the maternal uterus, adnexal regions, and pelvic cul-de-sac. Transvaginal technique was performed to assess early pregnancy. COMPARISON:  None. FINDINGS: Intrauterine gestational sac: A single intrauterine gestational sac is present. Yolk sac:  Yolk sac is present. Embryo:  Fetal pole is identified. Cardiac Activity: Fetal cardiac activity is observed. Heart Rate: 153 bpm CRL: 16.8 mm   8 w   1 d                  [place] EDC: 10/24/2021 Subchorionic hemorrhage: A large subchorionic hemorrhage is demonstrated, measuring 6 x 2.9 x 4.7 cm. Maternal uterus/adnexae: Uterus is anteverted. No myometrial mass lesions identified. Both ovaries are visualized and appear normal. No abnormal adnexal masses. Small amount of free fluid in the pelvis. IMPRESSION: 1. Single intrauterine pregnancy is identified. Estimated gestational age by crown-rump length is 8 weeks 1 day. 2. A large subchorionic hemorrhage is present. Electronically Signed   By: Burman Nieves M.D.   On:  03/15/2021 02:02   US OB Transvaginal  Result Date: 03/15/2021 CLINICAL DATA:  Pelvic pain for 3 days. Quantitative beta HCG is 92,387. LMP was 03/04/2021. EXAM: OBSTETRIC <14 WK [place] AND TRANSVAGINAL OB US TECHNIQUE: Both transabdominal and transvaginal ultrasound examinations were performed for complete evaluation of the gestation as well as the maternal uterus, adnexal regions, and pelvic cul-de-sac. Transvaginal technique was performed to assess early pregnancy. COMPARISON:  None. FINDINGS: Intrauterine gestational sac: A single intrauterine gestational sac is present. Yolk sac:  Yolk sac is present. Embryo:  Fetal pole is identified. Cardiac Activity: Fetal cardiac activity is observed. Heart Rate: 153 bpm CRL: 16.8 mm   8 w   1 d                  [place] EDC: 10/24/2021 Subchorionic hemorrhage: A large subchorionic hemorrhage is demonstrated, measuring 6 x 2.9 x 4.7 cm. Maternal uterus/adnexae: Uterus is anteverted. No myometrial mass lesions identified. Both ovaries are visualized and appear normal. No abnormal adnexal masses. Small amount of free fluid in the pelvis. IMPRESSION: 1. Single intrauterine pregnancy is identified. Estimated gestational age by crown-rump length is 8 weeks 1 day. 2. A large subchorionic hemorrhage is present. Electronically Signed   By: Burman Nieves M.D.   On: 03/15/2021 02:02    Procedures Procedures   Medications Ordered in ED Medications - No data to display  ED Course  I have reviewed the triage vital signs and the nursing notes.  Pertinent labs & imaging results that were available during my care of the patient were reviewed by me and considered in my medical decision making (see chart for details).    MDM Rules/Calculators/A&P                         Patient  presents to the ED with complaints of abdominal pain. Nontoxic, vitals without significant abnormality.  Abdomen nontender without peritoneal signs.  Additional history obtained:  Additional history  obtained from chart review & nursing note review.   Lab Tests:  I reviewed and interpreted labs, which included:  CBC: Mild anemia improved from prior.  No leukocytosis. CMP: Unremarkable Lipase: Within normal limits Pregnancy test, positive, quantitative hCG 41,324.  Patient with positive pregnancy test, pelvic exam performed, discharge present, no bleeding, no cervical motion tenderness or adnexal tenderness.  We will proceed with ABO Rh work-up as well as ultrasound.  Fluids and Tylenol ordered.  Wet prep: Trichomonas present.  Imaging Studies ordered:  I ordered imaging studies which included OB US, I independently reviewed, formal radiology impression shows:  1. Single intrauterine pregnancy is identified. Estimated gestational age by crown-rump length is 8 weeks 1 day. 2. A large subchorionic hemorrhage is present  Urinalysis with rare bacteria, not grossly infected, sent for culture given patient is pregnant.  Patient is feeling improved after fluids in the emergency department, she is tolerating p.o.  IUP, no ectopic, subchorionic hemorrhage is noted.  ABO positive..  We discussed her trichomonas result, given she is in the first trimester pregnancy initial plan was to defer to OB/GYN in terms of treatment of this, patient states that she would like treatment for it.  We discussed risk/benefits specifically in pregnancy, she states that she is unlikely to keep this pregnancy and would like to proceed with treatment, she confirmed understanding of the risk, will prescribe one-time dose of Flagyl, it appears this is what she received from OB/GYN when she was pregnant with trichomonas last time.  We discussed the need to inform all sexual partners and abstain until 1 week following completion of treatment.  We will provide Diclegis for her nausea and vomiting.  [facility] for continued management/care.   I discussed results, treatment plan, need for follow-up,  and return precautions with the patient. Provided opportunity for questions, patient confirmed understanding and is in agreement with plan.   Findings and plan of care discussed with supervising physician Dr. Blinda Leatherwood who is in agreement.   Portions of this note were generated with Scientist, clinical (histocompatibility and immunogenetics). Dictation errors may occur despite best attempts at proofreading.  Final Clinical Impression(s) / ED Diagnoses Final diagnoses:  Positive pregnancy test  Subchorionic hemorrhage of placenta in first trimester, single or unspecified fetus  Trichomonas infection    Rx / DC Orders ED Discharge Orders          Ordered    metroNIDAZOLE (FLAGYL) 500 MG tablet   Once        03/15/21 0529    Doxylamine-Pyridoxine 10-10 MG TBEC        03/15/21 0534             Chason Mciver, Pleas Koch, PA-C 03/15/21 4010    Gilda Crease, MD 03/15/21 [phone]

## 2021-03-14 NOTE — ED Triage Notes (Signed)
Pt c/o generalized abd discomfort and n/v x 3-4 days. Denies fevers or urinary symptoms

## 2021-03-15 ENCOUNTER — Encounter (HOSPITAL_COMMUNITY): Payer: Self-pay | Admitting: Student

## 2021-03-15 ENCOUNTER — Emergency Department (HOSPITAL_COMMUNITY): Payer: Medicaid Other

## 2021-03-15 LAB — WET PREP, GENITAL
Sperm: NONE SEEN
Yeast Wet Prep HPF POC: NONE SEEN

## 2021-03-15 LAB — GC/CHLAMYDIA PROBE AMP (~~LOC~~) NOT AT ARMC
Chlamydia: NEGATIVE
Comment: NEGATIVE
Comment: NORMAL
Neisseria Gonorrhea: NEGATIVE

## 2021-03-15 LAB — URINALYSIS, ROUTINE W REFLEX MICROSCOPIC
Bilirubin Urine: NEGATIVE
Glucose, UA: NEGATIVE mg/dL
Hgb urine dipstick: NEGATIVE
Ketones, ur: 5 mg/dL — AB
Nitrite: NEGATIVE
Protein, ur: 30 mg/dL — AB
Specific Gravity, Urine: 1.029 (ref 1.005–1.030)
pH: 5 (ref 5.0–8.0)

## 2021-03-15 LAB — HCG, QUANTITATIVE, PREGNANCY: hCG, Beta Chain, Quant, S: 92387 m[IU]/mL — ABNORMAL HIGH (ref <5)

## 2021-03-15 LAB — ABO/RH: ABO/RH(D): B POS

## 2021-03-15 MED ORDER — METRONIDAZOLE 500 MG PO TABS: 2000.0000 mg | ORAL_TABLET | Freq: Once | ORAL | 0 refills | Status: AC

## 2021-03-15 MED ORDER — SODIUM CHLORIDE 0.9 % IV BOLUS
1000.0000 mL | Freq: Once | INTRAVENOUS | Status: AC
  Administered 2021-03-15: 02:00:00 1000 mL via INTRAVENOUS

## 2021-03-15 MED ORDER — ACETAMINOPHEN 325 MG PO TABS
650.0000 mg | ORAL_TABLET | Freq: Once | ORAL | Status: AC
  Administered 2021-03-15: 02:00:00 650 mg via ORAL
  Filled 2021-03-15: qty 2

## 2021-03-15 MED ORDER — DOXYLAMINE-PYRIDOXINE 10-10 MG PO TBEC: DELAYED_RELEASE_TABLET | ORAL | 0 refills | Status: DC

## 2021-03-15 NOTE — ED Notes (Signed)
Ultrasound at bedside

## 2021-03-15 NOTE — Discharge Instructions (Addendum)
You were seen in the emergency department for abdominal pain.  Your pregnancy test was positive.  Your ultrasound did show that you have a normal pregnancy, however you do have a subchorionic hemorrhage.  Your wet prep showed findings of trichomonas, this is a sexually transmitted infection, you will need to inform all sexual partners.  We are sending you home with Flagyl to take 4 tablets as a one-time dose.  Do not have intercourse until least 1 week following completion of treatment.  We are also sending you home with Diclegis to take as needed for nausea and vomiting.  We have prescribed you new medication(s) today. Discuss the medications prescribed today with your pharmacist as they can have adverse effects and interactions with your other medicines including over the counter and prescribed medications. Seek medical evaluation if you start to experience new or abnormal symptoms after taking one of these medicines, seek care immediately if you start to experience difficulty breathing, feeling of your throat closing, facial swelling, or rash as these could be indications of a more serious allergic reaction   We would like you to follow-up with OB/GYN within 3 days.  Return to the ER for new or worsening symptoms including but not limited to fever, inability to keep fluids down, new or worsening pain, heavy vaginal bleeding, dizziness, passing out, or any other concerns.

## 2021-03-15 NOTE — ED Notes (Signed)
Pt discharged from this ED in stable condition at this time. All discharge instructions and follow up care reviewed with pt with no further questions at this time. Pt ambulatory with steady gait, clear speech.  

## 2021-03-16 LAB — URINE CULTURE: Culture: 1000 — AB

## 2021-03-17 NOTE — Progress Notes (Signed)
ED Antimicrobial Stewardship Positive Culture Follow Up   Amanda Russell is an 25 y.o. female who presented to [facility] on 03/14/2021 with a chief complaint of abdominal discomfort and nausea/vomiting   Chief Complaint  Patient presents with   Emesis    Recent Results (from the past 720 hour(s))  Wet prep, genital     Status: Abnormal   Collection Time: 03/15/21 12:39 AM   Specimen: Cervix  Result Value Ref Range Status   Yeast Wet Prep HPF POC NONE SEEN NONE SEEN Final   Trich, Wet Prep PRESENT (A) NONE SEEN Final   Clue Cells Wet Prep HPF POC PRESENT (A) NONE SEEN Final   WBC, Wet Prep HPF POC PRESENT (A) NONE SEEN Final   Sperm NONE SEEN  Final    Comment: Performed at [facility], [address], [place], [place] [address]  Urine culture     Status: Abnormal   Collection Time: 03/15/21  3:00 AM   Specimen: Urine, Clean Catch  Result Value Ref Range Status   Specimen Description   Final    URINE, CLEAN CATCH Performed at [facility], [address] [place], [place] [address]    Special Requests   Final    NONE Performed at [facility], [address], [place], [place] [address]    Culture (A)  Final    1,000 COLONIES/mL STREPTOCOCCUS AGALACTIAE TESTING AGAINST S. AGALACTIAE NOT ROUTINELY PERFORMED DUE TO PREDICTABILITY OF AMP/PEN/VAN SUSCEPTIBILITY. Performed at [facility], [address], [place], [place] [address]    Report Status 03/16/2021 FINAL  Final   25 YOF reported to the ED with complaints of lower abdominal discomfort and nausea/vomiting x 3 days. She also reported associated vaginal bleeding/spotting but denied any fevers or urinary symptoms. At the ED visit tested positive for pregnancy and wet prep showed presence of trichomoniasis. Pt was treated for trichomoniasis with Flagyl 2 g x 1 dose. Per urine culture, no treatment necessary at this time due to low colony count (1,000  colonies/mL)  Plan: No treatment necessary   ED Provider: Claudette Stapler, PA-C   Amanda Russell, Student Pharmacist 03/17/2021, 8:49 AM

## 2021-03-26 ENCOUNTER — Encounter (HOSPITAL_COMMUNITY): Payer: Self-pay

## 2021-03-26 ENCOUNTER — Inpatient Hospital Stay (HOSPITAL_COMMUNITY)
Admission: AD | Admit: 2021-03-26 | Discharge: 2021-03-26 | Disposition: A | Discharge status: Home / Self Care | Payer: Medicaid Other | Attending: Obstetrics & Gynecology | Admitting: Obstetrics & Gynecology

## 2021-03-26 DIAGNOSIS — O219 Vomiting of pregnancy, unspecified: Secondary | ICD-10-CM | POA: Diagnosis present

## 2021-03-26 DIAGNOSIS — R109 Unspecified abdominal pain: Secondary | ICD-10-CM | POA: Insufficient documentation

## 2021-03-26 DIAGNOSIS — Z87891 Personal history of nicotine dependence: Secondary | ICD-10-CM | POA: Diagnosis not present

## 2021-03-26 DIAGNOSIS — Z3A09 9 weeks gestation of pregnancy: Secondary | ICD-10-CM | POA: Insufficient documentation

## 2021-03-26 LAB — URINALYSIS, ROUTINE W REFLEX MICROSCOPIC
Bilirubin Urine: NEGATIVE
Glucose, UA: NEGATIVE mg/dL
Hgb urine dipstick: NEGATIVE
Ketones, ur: 20 mg/dL — AB
Nitrite: NEGATIVE
Protein, ur: 100 mg/dL — AB
Specific Gravity, Urine: 1.027 (ref 1.005–1.030)
pH: 9 — ABNORMAL HIGH (ref 5.0–8.0)

## 2021-03-26 LAB — RAPID URINE DRUG SCREEN, HOSP PERFORMED
Amphetamines: NOT DETECTED
Barbiturates: NOT DETECTED
Benzodiazepines: NOT DETECTED
Cocaine: NOT DETECTED
Opiates: NOT DETECTED
Tetrahydrocannabinol: POSITIVE — AB

## 2021-03-26 LAB — CBC
HCT: 33.4 % — ABNORMAL LOW (ref 36.0–46.0)
Hemoglobin: 11.5 g/dL — ABNORMAL LOW (ref 12.0–15.0)
MCH: 27.5 pg (ref 26.0–34.0)
MCHC: 34.4 g/dL (ref 30.0–36.0)
MCV: 79.9 fL — ABNORMAL LOW (ref 80.0–100.0)
Platelets: 223 10*3/uL (ref 150–400)
RBC: 4.18 MIL/uL (ref 3.87–5.11)
RDW: 14.2 % (ref 11.5–15.5)
WBC: 8.6 10*3/uL (ref 4.0–10.5)
nRBC: 0 % (ref 0.0–0.2)

## 2021-03-26 LAB — BASIC METABOLIC PANEL
Anion gap: 10 (ref 5–15)
BUN: 6 mg/dL (ref 6–20)
CO2: 20 mmol/L — ABNORMAL LOW (ref 22–32)
Calcium: 9.5 mg/dL (ref 8.9–10.3)
Chloride: 103 mmol/L (ref 98–111)
Creatinine, Ser: 0.74 mg/dL (ref 0.44–1.00)
GFR, Estimated: >60 mL/min (ref >60)
Glucose, Bld: 115 mg/dL — ABNORMAL HIGH (ref 70–99)
Potassium: 3.2 mmol/L — ABNORMAL LOW (ref 3.5–5.1)
Sodium: 133 mmol/L — ABNORMAL LOW (ref 135–145)

## 2021-03-26 MED ORDER — SCOPOLAMINE 1 MG/3DAYS TD PT72: 1.0000 | MEDICATED_PATCH | TRANSDERMAL | 0 refills | Status: AC

## 2021-03-26 MED ORDER — LACTATED RINGERS IV BOLUS
1000.0000 mL | Freq: Once | INTRAVENOUS | Status: AC
  Administered 2021-03-26: 1000 mL via INTRAVENOUS

## 2021-03-26 MED ORDER — METOCLOPRAMIDE HCL 5 MG/ML IJ SOLN
10.0000 mg | Freq: Once | INTRAMUSCULAR | Status: AC
  Administered 2021-03-26: 10 mg via INTRAVENOUS
  Filled 2021-03-26: qty 2

## 2021-03-26 MED ORDER — LACTATED RINGERS IV SOLN: INTRAVENOUS | Status: DC

## 2021-03-26 MED ORDER — HALOPERIDOL LACTATE 5 MG/ML IJ SOLN
5.0000 mg | Freq: Once | INTRAMUSCULAR | Status: AC
  Administered 2021-03-26: 5 mg via INTRAVENOUS
  Filled 2021-03-26: qty 1

## 2021-03-26 MED ORDER — PROMETHAZINE HCL 25 MG PO TABS: 25.0000 mg | ORAL_TABLET | Freq: Four times a day (QID) | ORAL | 0 refills | Status: DC | PRN

## 2021-03-26 MED ORDER — PANTOPRAZOLE SODIUM 40 MG PO TBEC: 40.0000 mg | DELAYED_RELEASE_TABLET | Freq: Every day | ORAL | 0 refills | Status: DC

## 2021-03-26 MED ORDER — SCOPOLAMINE 1 MG/3DAYS TD PT72
1.0000 | MEDICATED_PATCH | Freq: Once | TRANSDERMAL | Status: DC
  Administered 2021-03-26: 1.5 mg via TRANSDERMAL
  Filled 2021-03-26: qty 1

## 2021-03-26 MED ORDER — FAMOTIDINE IN NACL 20-0.9 MG/50ML-% IV SOLN
20.0000 mg | Freq: Once | INTRAVENOUS | Status: AC
  Administered 2021-03-26: 20 mg via INTRAVENOUS
  Filled 2021-03-26: qty 50

## 2021-03-26 NOTE — MAU Note (Signed)
Pt arrived via EMS with complaints of nausea and abdominal pain. Pt is crying hysterically. Pt rolling side to side stating she cant do anything. Reassurance and support provided.

## 2021-03-26 NOTE — MAU Note (Signed)
Pt reports continuous restlessness with abdominal pain. Denies uterine cramping. "Its a sickness"

## 2021-03-26 NOTE — MAU Note (Signed)
Pt vomited 100 ml yellow emesis.

## 2021-03-26 NOTE — MAU Provider Note (Addendum)
History     CSN: [id number]  Arrival date and time: 03/26/21 3662   Event Date/Time   First Provider Initiated Contact with Patient 03/26/21 [id number]      Chief Complaint  Patient presents with   Abdominal Pain   IDALIS HOELTING is a 25 y.o. [id number] at [redacted]w[redacted]d by early [place] who has not established .  She presents today for Nausea and Vomiting.  Paitent states she has been "sick for weeks" and that "it is just getting worser."  She reports she has been throwing up "all last night and all morning."    Of note, patient repeatedly states "I am sick... I can't do this."  Patient also expresses desire for termination.  Patient  moaning and thrashing in bed.     OB History     Gravida  5   Para  3   Term  2   Preterm  1   AB  1   Living  3      SAB      IAB      Ectopic  1   Multiple  0   Live Births  3           Past Medical History:  Diagnosis Date   ADHD (attention deficit hyperactivity disorder), combined type 07/14/2012   Chlamydia    Preterm premature rupture of membranes (PPROM) with unknown onset of labor 07/13/2014   Suicidal ideation     Past Surgical History:  Procedure Laterality Date   LAPAROSCOPY Left 10/16/2019   Procedure: LAPAROSCOPIC LEFT SALPINGECTOMY;  Surgeon: [place] Bing, MD;  Location: [facility];  Service: Gynecology;  Laterality: Left;   NO PAST SURGERIES      Family History  Problem Relation Age of Onset   Hypertension Other    Cancer Neg Hx    Diabetes Neg Hx     Social History   Tobacco Use   Smoking status: Former    Packs/day: 0.50    Years: 0.80    Pack years: 0.40    Types: Cigarettes    Quit date: 01/07/2011    Years since quitting: 10.2   Smokeless tobacco: Never  Vaping Use   Vaping Use: Never used  Substance Use Topics   Alcohol use: Not Currently    Comment: socially   Drug use: Not Currently    Frequency: 7.0 times per week    Types: Marijuana    Comment: denies pt states it has been years 02/18/20     Allergies:  Allergies  Allergen Reactions   Pollen Extract Itching and Cough    Medications Prior to Admission  Medication Sig Dispense Refill Last Dose   Doxylamine-Pyridoxine 10-10 MG TBEC Initial: Two tablets at bedtime on day 1 and 2; if symptoms persist, take 1 tablet in morning and 2 tablets at bedtime on day 3; if symptoms persist, may increase to 1 tablet in morning, 1 tablet mid-afternoon, and 2 tablets at bedtime on day 4 60 tablet 0 03/26/2021 at 0500   acetaminophen (TYLENOL) 325 MG tablet Take 2 tablets (650 mg total) by mouth every 4 (four) hours as needed (for pain scale < 4). 60 tablet 1    ferrous sulfate 325 (65 FE) MG tablet Take 1 tablet (325 mg total) by mouth daily with breakfast. 90 tablet 3    ibuprofen (ADVIL) 600 MG tablet Take 1 tablet (600 mg total) by mouth every 6 (six) hours as needed for cramping. 60 tablet 1  Review of Systems  Gastrointestinal:  Positive for nausea and vomiting.  Physical Exam   Blood pressure 109/62, pulse (!) 50, temperature (!) 97.5 F (36.4 C), temperature source Oral, resp. rate 12, last menstrual period 03/04/2021, SpO2 100 %, unknown if currently breastfeeding.  Physical Exam Constitutional:      General: She is not in acute distress.    Appearance: She is well-developed.  HENT:     Head: Normocephalic and atraumatic.  Eyes:     Conjunctiva/sclera: Conjunctivae normal.  Musculoskeletal:        General: Normal range of motion.     Cervical back: Normal range of motion.  Neurological:     Mental Status: She is alert.  Psychiatric:        Speech: Speech is tangential.        Behavior: Behavior is agitated.    MAU Course  Procedures Results for orders placed or performed during the hospital encounter of 03/26/21 (from the past 24 hour(s))  CBC     Status: Abnormal   Collection Time: 03/26/21  7:13 AM  Result Value Ref Range   WBC 8.6 4.0 - 10.5 K/uL   RBC 4.18 3.87 - 5.11 MIL/uL   Hemoglobin 11.5 (L) 12.0 -  15.0 g/dL   HCT 67.6 (L) 19.5 - 09.3 %   MCV 79.9 (L) 80.0 - 100.0 fL   MCH 27.5 26.0 - 34.0 pg   MCHC 34.4 30.0 - 36.0 g/dL   RDW 26.7 12.4 - 58.0 %   Platelets 223 150 - 400 K/uL   nRBC 0.0 0.0 - 0.2 %  Basic metabolic panel     Status: Abnormal   Collection Time: 03/26/21  7:13 AM  Result Value Ref Range   Sodium 133 (L) 135 - 145 mmol/L   Potassium 3.2 (L) 3.5 - 5.1 mmol/L   Chloride 103 98 - 111 mmol/L   CO2 20 (L) 22 - 32 mmol/L   Glucose, Bld 115 (H) 70 - 99 mg/dL   BUN 6 6 - 20 mg/dL   Creatinine, Ser 9.98 0.44 - 1.00 mg/dL   Calcium 9.5 8.9 - 33.8 mg/dL   GFR, Estimated >25 >05 mL/min   Anion gap 10 5 - 15  Urinalysis, Routine w reflex microscopic Urine, Clean Catch     Status: Abnormal   Collection Time: 03/26/21  7:13 AM  Result Value Ref Range   Color, Urine AMBER (A) YELLOW   APPearance CLOUDY (A) CLEAR   Specific Gravity, Urine 1.027 1.005 - 1.030   pH 9.0 (H) 5.0 - 8.0   Glucose, UA NEGATIVE NEGATIVE mg/dL   Hgb urine dipstick NEGATIVE NEGATIVE   Bilirubin Urine NEGATIVE NEGATIVE   Ketones, ur 20 (A) NEGATIVE mg/dL   Protein, ur 397 (A) NEGATIVE mg/dL   Nitrite NEGATIVE NEGATIVE   Leukocytes,Ua MODERATE (A) NEGATIVE   RBC / HPF 6-10 0 - 5 RBC/hpf   WBC, UA 0-5 0 - 5 WBC/hpf   Bacteria, UA RARE (A) NONE SEEN   Squamous Epithelial / LPF 21-50 0 - 5   Mucus PRESENT   Rapid urine drug screen (hospital performed)     Status: Abnormal   Collection Time: 03/26/21  7:13 AM  Result Value Ref Range   Opiates NONE DETECTED NONE DETECTED   Cocaine NONE DETECTED NONE DETECTED   Benzodiazepines NONE DETECTED NONE DETECTED   Amphetamines NONE DETECTED NONE DETECTED   Tetrahydrocannabinol POSITIVE (A) NONE DETECTED   Barbiturates NONE DETECTED NONE DETECTED  MDM Start IV LR Bolus  Antiemetic Labs: CBC, BMP, UA, UDS Assessment and Plan  25 year old, [id number]  SIUP at 9.4 weeks Nausea/Vomiting  -Reviewed POC with patient. -Start IV and give LR Bolus -Will  give Reglan for c/o nausea. -Nurse instructed to collect labs. -Will monitor and reassess.   Cherre Robins 03/26/2021, 6:55 AM    Patient reports continued symptoms after reglan. Positive for marijuana. Given Haldol, pepcid, & scop patch. Pt resting comfortably. Tolerated ice chips. Not observed vomiting.     1. Nausea and vomiting during pregnancy prior to [redacted] weeks gestation   2. [redacted] weeks gestation of pregnancy    -Rx phenergan, protonix, & scopolamine -d/c marijuana usage   Judeth Horn, NP

## 2021-03-26 NOTE — MAU Note (Signed)
Pt states she was told she was pregnant a week ago and was told she had trichomonas but she was unable to take the medication due to her nausea and vomiting.Pt uncooperative with answering intake questions.

## 2021-04-12 ENCOUNTER — Other Ambulatory Visit: Payer: Self-pay

## 2021-04-12 ENCOUNTER — Emergency Department (HOSPITAL_COMMUNITY)
Admission: EM | Admit: 2021-04-12 | Discharge: 2021-04-13 | Disposition: A | Discharge status: Home / Self Care | Payer: Medicaid Other | Attending: Emergency Medicine | Admitting: Emergency Medicine

## 2021-04-12 DIAGNOSIS — Z87891 Personal history of nicotine dependence: Secondary | ICD-10-CM | POA: Insufficient documentation

## 2021-04-12 DIAGNOSIS — Y900 Blood alcohol level of less than 20 mg/100 ml: Secondary | ICD-10-CM | POA: Insufficient documentation

## 2021-04-12 DIAGNOSIS — R109 Unspecified abdominal pain: Secondary | ICD-10-CM | POA: Diagnosis not present

## 2021-04-12 DIAGNOSIS — F10129 Alcohol abuse with intoxication, unspecified: Secondary | ICD-10-CM

## 2021-04-12 DIAGNOSIS — Z3A11 11 weeks gestation of pregnancy: Secondary | ICD-10-CM | POA: Insufficient documentation

## 2021-04-12 DIAGNOSIS — O99311 Alcohol use complicating pregnancy, first trimester: Secondary | ICD-10-CM | POA: Insufficient documentation

## 2021-04-12 DIAGNOSIS — Z3490 Encounter for supervision of normal pregnancy, unspecified, unspecified trimester: Secondary | ICD-10-CM

## 2021-04-13 ENCOUNTER — Encounter (HOSPITAL_COMMUNITY): Payer: Self-pay

## 2021-04-13 LAB — COMPREHENSIVE METABOLIC PANEL
ALT: 17 U/L (ref 0–44)
AST: 22 U/L (ref 15–41)
Albumin: 4.6 g/dL (ref 3.5–5.0)
Alkaline Phosphatase: 57 U/L (ref 38–126)
Anion gap: 13 (ref 5–15)
BUN: 5 mg/dL — ABNORMAL LOW (ref 6–20)
CO2: 20 mmol/L — ABNORMAL LOW (ref 22–32)
Calcium: 9.8 mg/dL (ref 8.9–10.3)
Chloride: 105 mmol/L (ref 98–111)
Creatinine, Ser: 0.61 mg/dL (ref 0.44–1.00)
GFR, Estimated: >60 mL/min (ref >60)
Glucose, Bld: 141 mg/dL — ABNORMAL HIGH (ref 70–99)
Potassium: 3 mmol/L — ABNORMAL LOW (ref 3.5–5.1)
Sodium: 138 mmol/L (ref 135–145)
Total Bilirubin: 0.7 mg/dL (ref 0.3–1.2)
Total Protein: 8.4 g/dL — ABNORMAL HIGH (ref 6.5–8.1)

## 2021-04-13 LAB — CBC
HCT: 32.7 % — ABNORMAL LOW (ref 36.0–46.0)
Hemoglobin: 11.1 g/dL — ABNORMAL LOW (ref 12.0–15.0)
MCH: 27.8 pg (ref 26.0–34.0)
MCHC: 33.9 g/dL (ref 30.0–36.0)
MCV: 81.8 fL (ref 80.0–100.0)
Platelets: 280 10*3/uL (ref 150–400)
RBC: 4 MIL/uL (ref 3.87–5.11)
RDW: 14.1 % (ref 11.5–15.5)
WBC: 11.3 10*3/uL — ABNORMAL HIGH (ref 4.0–10.5)
nRBC: 0 % (ref 0.0–0.2)

## 2021-04-13 LAB — ABO/RH: ABO/RH(D): B POS

## 2021-04-13 LAB — ETHANOL: Alcohol, Ethyl (B): <10 mg/dL (ref <10)

## 2021-04-13 MED ORDER — ACETAMINOPHEN 500 MG PO TABS
1000.0000 mg | ORAL_TABLET | Freq: Once | ORAL | Status: AC
  Administered 2021-04-13: 1000 mg via ORAL
  Filled 2021-04-13: qty 2

## 2021-04-13 MED ORDER — SODIUM CHLORIDE 0.9 % IV BOLUS
1000.0000 mL | Freq: Once | INTRAVENOUS | Status: AC
  Administered 2021-04-13: 1000 mL via INTRAVENOUS

## 2021-04-13 MED ORDER — DIPHENHYDRAMINE HCL 50 MG/ML IJ SOLN
25.0000 mg | Freq: Once | INTRAMUSCULAR | Status: AC
  Administered 2021-04-13: 25 mg via INTRAVENOUS
  Filled 2021-04-13: qty 1

## 2021-04-13 MED ORDER — ONDANSETRON HCL 4 MG/2ML IJ SOLN
4.0000 mg | Freq: Once | INTRAMUSCULAR | Status: AC
  Administered 2021-04-13: 4 mg via INTRAVENOUS
  Filled 2021-04-13: qty 2

## 2021-04-13 NOTE — ED Notes (Addendum)
Pt refuses vitals and is taking off BP cuff and pulse ox. Pt uncooperative

## 2021-04-13 NOTE — Discharge Instructions (Addendum)
You were evaluated in the Emergency Department and after careful evaluation, we did not find any emergent condition requiring admission or further testing in the hospital.  Your exam/testing today was overall reassuring.  Symptoms likely due to a hangover or alcohol poisoning.  Your testing today was reassuring, we looked at your pregnancy and ultrasound and the fetus looks normal, heartbeat detected.  Recommend close follow-up with OB/GYN for future management.  Please return to the Emergency Department if you experience any worsening of your condition.  Thank you for allowing [place] to be a part of your care.

## 2021-04-13 NOTE — ED Notes (Signed)
Pt has emesis bag in hand and is ambulating in room. Pt states she is weak and asked to get in the bed due to weakness

## 2021-04-13 NOTE — ED Notes (Addendum)
Pt in bed screaming and crying. States "I am sick". Pt states she has not eaten all day and knows she is pregnant

## 2021-04-13 NOTE — ED Notes (Signed)
Pt leaned over side of bed and vomited bile in the floor. Pt provided towels to clean up vomit.

## 2021-04-13 NOTE — ED Notes (Signed)
This RN assisted Dr. Pilar Plate with [place]. Pt provided snacks and gingerale

## 2021-04-13 NOTE — ED Triage Notes (Signed)
BIB EMS from outside of hotel. C/o abdominal pain due alleged pregnancy. Pt has been drinking and partying for the last 24 hours. Pt states she is dying

## 2021-04-13 NOTE — ED Provider Notes (Signed)
[facility] Emergency Department Provider Note MRN:  [id number]  Arrival date & time: 04/13/21     Chief Complaint   Abdominal Pain and Alcohol Intoxication   History of Present Illness   Amanda Russell is a 25 y.o. year-old female with no pertinent past medical history presenting to the ED with chief complaint of abdominal pain.  Patient explains that she was bleeding vaginally last week.  Thought she lost her pregnancy.  Amanda Russell was "drinking all types of liquor and smoking all types of marijuana" and all day today she has been feeling very unwell, abdominal discomfort, malaise, fatigue, nausea, vomiting.  Denies fever, no other complaints.  Symptoms are severe, constant, no exacerbating or alleviating factors.  Review of Systems  A complete 10 system review of systems was obtained and all systems are negative except as noted in the HPI and PMH.   Patient's Health History    Past Medical History:  Diagnosis Date   ADHD (attention deficit hyperactivity disorder), combined type 07/14/2012   Chlamydia    Preterm premature rupture of membranes (PPROM) with unknown onset of labor 07/13/2014   Suicidal ideation     Past Surgical History:  Procedure Laterality Date   LAPAROSCOPY Left 10/16/2019   Procedure: LAPAROSCOPIC LEFT SALPINGECTOMY;  Surgeon: [place] Bing, MD;  Location: [facility];  Service: Gynecology;  Laterality: Left;   NO PAST SURGERIES      Family History  Problem Relation Age of Onset   Hypertension Other    Cancer Neg Hx    Diabetes Neg Hx     Social History   Socioeconomic History   Marital status: Single    Spouse name: Not on file   Number of children: Not on file   Years of education: Not on file   Highest education level: Not on file  Occupational History   Occupation: Student    Comment: 10th grade at [employer]  Tobacco Use   Smoking status: Former    Packs/day: 0.50    Years: 0.80    Pack years: 0.40    Types:  Cigarettes    Quit date: 01/07/2011    Years since quitting: 10.2   Smokeless tobacco: Never  Vaping Use   Vaping Use: Never used  Substance and Sexual Activity   Alcohol use: Not Currently    Comment: socially   Drug use: Yes    Frequency: 7.0 times per week    Types: Marijuana    Comment: denies pt states it has been years 02/18/20   Sexual activity: Yes    Partners: Male    Birth control/protection: None    Comment: Patient states her last time taking BCP was last year  Other Topics Concern   Not on file  Social History Narrative   Not on file   Social Determinants of Health   Financial Resource Strain: Not on file  Food Insecurity: Not on file  Transportation Needs: Not on file  Physical Activity: Not on file  Stress: Not on file  Social Connections: Not on file  Intimate Partner Violence: Not on file     Physical Exam   Vitals:   04/13/21 0004 04/13/21 0130  BP: (!) 124/103 (!) 116/94  Pulse: 78 (!) 51  Resp: 20 20  Temp: 98 F (36.7 C)   SpO2: 97% 100%    CONSTITUTIONAL: Well-appearing, in moderate distress due to discomfort NEURO:  Alert and oriented x 3, no focal deficits EYES:  eyes equal and  reactive ENT/NECK:  no LAD, no JVD CARDIO: Regular rate, well-perfused, normal S1 and S2 PULM:  CTAB no wheezing or rhonchi GI/GU:  normal bowel sounds, non-distended, non-tender MSK/SPINE:  No gross deformities, no edema SKIN:  no rash, atraumatic PSYCH:  Appropriate speech and behavior  *Additional and/or pertinent findings included in MDM below  Diagnostic and Interventional Summary    EKG Interpretation  Date/Time:    Ventricular Rate:    PR Interval:    QRS Duration:   QT Interval:    QTC Calculation:   R Axis:     Text Interpretation:         Labs Reviewed  CBC - Abnormal; Notable for the following components:      Result Value   WBC 11.3 (*)    Hemoglobin 11.1 (*)    HCT 32.7 (*)    All other components within normal limits   COMPREHENSIVE METABOLIC PANEL - Abnormal; Notable for the following components:   Potassium 3.0 (*)    CO2 20 (*)    Glucose, Bld 141 (*)    BUN 5 (*)    Total Protein 8.4 (*)    All other components within normal limits  ETHANOL  ABO/RH    No orders to display    Medications  sodium chloride 0.9 % bolus 1,000 mL (1,000 mLs Intravenous New Bag/Given 04/13/21 0119)  ondansetron (ZOFRAN) injection 4 mg (4 mg Intravenous Given 04/13/21 0118)  diphenhydrAMINE (BENADRYL) injection 25 mg (25 mg Intravenous Given 04/13/21 0119)  acetaminophen (TYLENOL) tablet 1,000 mg (1,000 mg Oral Given 04/13/21 0157)     Procedures  /  Critical Care Ultrasound ED OB Pelvic  Date/Time: 04/13/2021 1:43 AM Performed by: Sabas Sous, MD Authorized by: Sabas Sous, MD   Procedure details:    Indications: pregnant with abdominal pain     Assess:  Fetal viability   Technique:  Transabdominal obstetric (HCG+) exam   Images: archived    Uterine findings:    Single gestation: identified     Gestational sac: identified     Yolk sac: identified     Fetal pole: identified     Fetal heart rate: identified       ED Course and Medical Decision Making  I have reviewed the triage vital signs, the nursing notes, and pertinent available records from the EMR.  Listed above are laboratory and imaging tests that I personally ordered, reviewed, and interpreted and then considered in my medical decision making (see below for details).  Estimated [redacted] weeks pregnant here with likely a hangover and dehydration, however given the vaginal bleeding last week will obtain labs to evaluate for possible miscarriage.  Will perform bedside ultrasound as well.  Providing symptomatic management.  Continues to vomit here in the emergency department, will provide IV fluids and reassess.     Ultrasound confirms a continued intrauterine pregnancy and fetal heart is noted sonographically.  Rate between 140 and 150.  Labs are  reassuring, patient is tolerating p.o., appropriate for discharge.  Elmer Sow. Pilar Plate, MD [facility] Emergency Medicine [facility] [email or web address]  Final Clinical Impressions(s) / ED Diagnoses     ICD-10-CM   1. Hangover effect, with unspecified complication (HCC)  F10.129     2. Early stage of pregnancy  Z34.90       ED Discharge Orders     None        Discharge Instructions Discussed with and Provided to Patient:  Discharge Instructions      You were evaluated in the Emergency Department and after careful evaluation, we did not find any emergent condition requiring admission or further testing in the hospital.  Your exam/testing today was overall reassuring.  Symptoms likely due to a hangover or alcohol poisoning.  Your testing today was reassuring, we looked at your pregnancy and ultrasound and the fetus looks normal, heartbeat detected.  Recommend close follow-up with OB/GYN for future management.  Please return to the Emergency Department if you experience any worsening of your condition.  Thank you for allowing [place] to be a part of your care.         Sabas Sous, MD 04/13/21 [phone]

## 2021-04-14 ENCOUNTER — Inpatient Hospital Stay (HOSPITAL_COMMUNITY)
Admission: AD | Admit: 2021-04-14 | Discharge: 2021-04-14 | Disposition: A | Discharge status: Home / Self Care | Payer: Medicaid Other | Attending: Obstetrics & Gynecology | Admitting: Obstetrics & Gynecology

## 2021-04-14 ENCOUNTER — Other Ambulatory Visit: Payer: Self-pay

## 2021-04-14 DIAGNOSIS — O210 Mild hyperemesis gravidarum: Secondary | ICD-10-CM | POA: Insufficient documentation

## 2021-04-14 DIAGNOSIS — Z3A12 12 weeks gestation of pregnancy: Secondary | ICD-10-CM | POA: Diagnosis not present

## 2021-04-14 DIAGNOSIS — Z87891 Personal history of nicotine dependence: Secondary | ICD-10-CM | POA: Insufficient documentation

## 2021-04-14 DIAGNOSIS — O26891 Other specified pregnancy related conditions, first trimester: Secondary | ICD-10-CM | POA: Diagnosis not present

## 2021-04-14 DIAGNOSIS — O99321 Drug use complicating pregnancy, first trimester: Secondary | ICD-10-CM | POA: Diagnosis not present

## 2021-04-14 DIAGNOSIS — R112 Nausea with vomiting, unspecified: Secondary | ICD-10-CM | POA: Diagnosis not present

## 2021-04-14 DIAGNOSIS — F12188 Cannabis abuse with other cannabis-induced disorder: Secondary | ICD-10-CM | POA: Diagnosis not present

## 2021-04-14 DIAGNOSIS — R5383 Other fatigue: Secondary | ICD-10-CM | POA: Diagnosis not present

## 2021-04-14 DIAGNOSIS — O09291 Supervision of pregnancy with other poor reproductive or obstetric history, first trimester: Secondary | ICD-10-CM | POA: Insufficient documentation

## 2021-04-14 LAB — URINALYSIS, MICROSCOPIC (REFLEX)

## 2021-04-14 LAB — URINALYSIS, ROUTINE W REFLEX MICROSCOPIC
Glucose, UA: NEGATIVE mg/dL
Ketones, ur: >80 mg/dL — AB
Leukocytes,Ua: NEGATIVE
Nitrite: NEGATIVE
Protein, ur: 100 mg/dL — AB
Specific Gravity, Urine: >1.03 — ABNORMAL HIGH (ref 1.005–1.030)
pH: 6 (ref 5.0–8.0)

## 2021-04-14 MED ORDER — SODIUM CHLORIDE 0.9 % IV SOLN
8.0000 mg | Freq: Once | INTRAVENOUS | Status: AC
  Administered 2021-04-14: 8 mg via INTRAVENOUS
  Filled 2021-04-14: qty 4

## 2021-04-14 MED ORDER — ONDANSETRON 8 MG PO TBDP: 8.0000 mg | ORAL_TABLET | Freq: Three times a day (TID) | ORAL | 0 refills | Status: DC | PRN

## 2021-04-14 MED ORDER — SODIUM CHLORIDE 0.9 % IV SOLN
12.5000 mg | Freq: Four times a day (QID) | INTRAVENOUS | Status: DC | PRN
  Administered 2021-04-14: 12.5 mg via INTRAVENOUS
  Filled 2021-04-14: qty 0.5

## 2021-04-14 MED ORDER — ACETAMINOPHEN 500 MG PO TABS
1000.0000 mg | ORAL_TABLET | Freq: Once | ORAL | Status: AC
  Administered 2021-04-14: 1000 mg via ORAL
  Filled 2021-04-14: qty 2

## 2021-04-14 MED ORDER — SCOPOLAMINE 1 MG/3DAYS TD PT72
1.0000 | MEDICATED_PATCH | TRANSDERMAL | Status: DC
  Administered 2021-04-14: 1.5 mg via TRANSDERMAL
  Filled 2021-04-14: qty 1

## 2021-04-14 MED ORDER — LACTATED RINGERS IV BOLUS
1000.0000 mL | Freq: Once | INTRAVENOUS | Status: AC
  Administered 2021-04-14: 1000 mL via INTRAVENOUS

## 2021-04-14 MED ORDER — FAMOTIDINE IN NACL 20-0.9 MG/50ML-% IV SOLN
20.0000 mg | Freq: Once | INTRAVENOUS | Status: AC
  Administered 2021-04-14: 20 mg via INTRAVENOUS
  Filled 2021-04-14: qty 50

## 2021-04-14 MED ORDER — HYDROXYZINE HCL 50 MG/ML IM SOLN
50.0000 mg | Freq: Once | INTRAMUSCULAR | Status: AC
  Administered 2021-04-14: 50 mg via INTRAMUSCULAR
  Filled 2021-04-14: qty 1

## 2021-04-14 NOTE — MAU Provider Note (Signed)
History     CSN: [id number]  Arrival date and time: 04/14/21 0831   Event Date/Time   First Provider Initiated Contact with Patient 04/14/21 [phone]      Chief Complaint  Patient presents with   Fatigue   Emesis   HPI  Ms.Amanda C Brownis a 25 y.o.female [id number] @ [redacted]w[redacted]d here in MAU with complaints of nausea and vomiting. Reports vomiting multiple times per day. She does smoke marijuana however has recently cut back. The Last time she smoked marijuana was 3 days ago.  She reports being in and out of the shower multiple times a day. Reports this is the only thing that helps after vomiting. Reports unable to get comfortable in the bed and fatigue. She is not taking anything for the nausea. No bleeding.   OB History     Gravida  5   Para  3   Term  2   Preterm  1   AB  1   Living  3      SAB      IAB      Ectopic  1   Multiple  0   Live Births  3           Past Medical History:  Diagnosis Date   ADHD (attention deficit hyperactivity disorder), combined type 07/14/2012   Chlamydia    Preterm premature rupture of membranes (PPROM) with unknown onset of labor 07/13/2014   Suicidal ideation     Past Surgical History:  Procedure Laterality Date   LAPAROSCOPY Left 10/16/2019   Procedure: LAPAROSCOPIC LEFT SALPINGECTOMY;  Surgeon: [place] Bing, MD;  Location: [facility];  Service: Gynecology;  Laterality: Left;   NO PAST SURGERIES      Family History  Problem Relation Age of Onset   Hypertension Other    Cancer Neg Hx    Diabetes Neg Hx     Social History   Tobacco Use   Smoking status: Former    Packs/day: 0.50    Years: 0.80    Pack years: 0.40    Types: Cigarettes    Quit date: 01/07/2011    Years since quitting: 10.2   Smokeless tobacco: Never  Vaping Use   Vaping Use: Never used  Substance Use Topics   Alcohol use: Not Currently    Comment: socially   Drug use: Yes    Frequency: 7.0 times per week    Types: Marijuana    Comment: denies  pt states it has been years 02/18/20    Allergies:  Allergies  Allergen Reactions   Pollen Extract Itching and Cough    Medications Prior to Admission  Medication Sig Dispense Refill Last Dose   acetaminophen (TYLENOL) 325 MG tablet Take 2 tablets (650 mg total) by mouth every 4 (four) hours as needed (for pain scale < 4). 60 tablet 1    Doxylamine-Pyridoxine 10-10 MG TBEC Initial: Two tablets at bedtime on day 1 and 2; if symptoms persist, take 1 tablet in morning and 2 tablets at bedtime on day 3; if symptoms persist, may increase to 1 tablet in morning, 1 tablet mid-afternoon, and 2 tablets at bedtime on day 4 60 tablet 0    ferrous sulfate 325 (65 FE) MG tablet Take 1 tablet (325 mg total) by mouth daily with breakfast. 90 tablet 3    pantoprazole (PROTONIX) 40 MG tablet Take 1 tablet (40 mg total) by mouth daily. 30 tablet 0    promethazine (PHENERGAN) 25 MG tablet Take  1 tablet (25 mg total) by mouth every 6 (six) hours as needed for nausea or vomiting. 30 tablet 0    Results for orders placed or performed during the hospital encounter of 04/14/21 (from the past 48 hour(s))  Urinalysis, Routine w reflex microscopic Urine, Clean Catch     Status: Abnormal   Collection Time: 04/14/21  9:13 AM  Result Value Ref Range   Color, Urine AMBER (A) YELLOW    Comment: BIOCHEMICALS MAY BE AFFECTED BY COLOR   APPearance HAZY (A) CLEAR   Specific Gravity, Urine >1.030 (H) 1.005 - 1.030   pH 6.0 5.0 - 8.0   Glucose, UA NEGATIVE NEGATIVE mg/dL   Hgb urine dipstick TRACE (A) NEGATIVE   Bilirubin Urine SMALL (A) NEGATIVE   Ketones, ur >80 (A) NEGATIVE mg/dL   Protein, ur 161 (A) NEGATIVE mg/dL   Nitrite NEGATIVE NEGATIVE   Leukocytes,Ua NEGATIVE NEGATIVE    Comment: Performed at [facility], [address], [place], [place] [address]  Urinalysis, Microscopic (reflex)     Status: Abnormal   Collection Time: 04/14/21  9:13 AM  Result Value Ref Range   RBC / HPF 6-10 0 - 5 RBC/hpf   WBC,  UA 0-5 0 - 5 WBC/hpf   Bacteria, UA RARE (A) NONE SEEN   Squamous Epithelial / LPF 0-5 0 - 5    Comment: MICROSCOPIC EXAM PERFORMED ON UNCONCENTRATED URINE LESS THAN 10 mL OF URINE SUBMITTED    Mucus PRESENT     Comment: Performed at [facility], [address], [place], [place] [address]     Review of Systems  Gastrointestinal:  Positive for abdominal pain, nausea and vomiting. Negative for constipation and diarrhea.  Genitourinary:  Negative for vaginal bleeding, vaginal discharge and vaginal pain.  Physical Exam   Blood pressure 125/72, pulse 65, temperature 97.9 F (36.6 C), temperature source Oral, resp. rate 18, height 5\' 5"  (1.651 m), weight 64.2 kg, last menstrual period 03/04/2021, SpO2 100 %, unknown if currently breastfeeding.  Physical Exam Vitals and nursing note reviewed.  Constitutional:      General: She is not in acute distress.    Appearance: Normal appearance. She is not ill-appearing, toxic-appearing or diaphoretic.  HENT:     Head: Normocephalic.  Abdominal:     Tenderness: There is generalized abdominal tenderness and tenderness in the suprapubic area. There is no guarding or rebound.  Musculoskeletal:        General: Normal range of motion.  Neurological:     Mental Status: She is alert and oriented to person, place, and time.  Psychiatric:        Mood and Affect: Mood is anxious. Affect is flat and tearful.        Judgment: Judgment is impulsive.   MAU Course  Procedures  Pt informed that the ultrasound is considered a limited OB ultrasound and is not intended to be a complete ultrasound exam.  Patient also informed that the ultrasound is not being completed with the intent of assessing for fetal or placental anomalies or any pelvic abnormalities.  Explained that the purpose of today's ultrasound is to assess for viability.  Patient acknowledges the purpose of the exam and the limitations of the study.   Active fetus noted.   MDM  Lr bolus x  2 Zofran 8 mg IV & Pepcid 20 mg  Patient tolerating oral fluids Patient started yelling out and moaning reporting worsening vomiting. We confirmed she has a ride  home. Phenergan 12.5 mg IV given & Vistaril 50 mg given IM.  No vomiting noted prior to DC home  Assessment and Plan   A:  1. Cannabis hyperemesis syndrome concurrent with and due to cannabis abuse (HCC)   2. [redacted] weeks gestation of pregnancy      P:  Discharge home in stable condition Stop smoking marijuana Rx: Zofran Return to MAU if symptoms worsen Small, frequent meals  Brandyn Thien, Harolyn Rutherford, NP 04/14/2021 2:22 PM

## 2021-04-14 NOTE — MAU Note (Signed)
Since 7:30  last night has been throwing up.  Doesn't know what is wrong with the baby.  Doesn't smoke, drink or do drugs. Is just so weak.  Asked her mom to bring her something, she doesn't know what is wrong. Having head and neck pain, pain in lower abd. Any thing she drinks she throws it back up. Took nausea pills that was prescribed, but it is not helping.  Can't sleep, her nerves are bad.  This can't be good for the baby.  (Keeps crying off and on).  "Feels like a hangover, but hasn't been drinking"

## 2021-04-25 ENCOUNTER — Ambulatory Visit: Payer: Self-pay | Admitting: *Deleted

## 2021-04-25 NOTE — Telephone Encounter (Signed)
  Pt reports pregnant, "About a month." States severe vaginal bleeding and abdominal cramping. States saturates more than 2 pads in an hour. Also states "Feels like something needs to come out but when I try it's just blood,"  No OB/GYN.Advised ED.States has driver. Reason for Disposition  SEVERE vaginal bleeding (e.g., soaking 2 pads or tampons per hour and present 2 or more hours; 1 menstrual cup every 2 hours)  Answer Assessment - Initial Assessment Questions 1. AMOUNT: "Describe the bleeding that you are having."    - SPOTTING: spotting, or pinkish / brownish mucous discharge; does not fill panty liner or pad    - MILD:  less than 1 pad / hour; less than patient's usual menstrual bleeding   - MODERATE: 1-2 pads / hour; 1 menstrual cup every 6 hours; small-medium blood clots (e.g., pea, grape, small coin)   - SEVERE: soaking 2 or more pads/hour for 2 or more hours; 1 menstrual cup every 2 hours; bleeding not contained by pads or continuous red blood from vagina; large blood clots (e.g., golf ball, large coin)      Severe 2. ONSET: "When did the bleeding begin?" "Is it continuing now?"     "A while" 3. MENSTRUAL PERIOD: "When was the last normal menstrual period?" "How is this different than your period?"    Tested pregnancy 1 month ago 4. REGULARITY: "How regular are your periods?"      5. ABDOMINAL PAIN: "Do you have any pain?" "How bad is the pain?"  (e.g., Scale 1-10; mild, moderate, or severe)   - MILD (1-3): doesn't interfere with normal activities, abdomen soft and not tender to touch    - MODERATE (4-7): interferes with normal activities or awakens from sleep, abdomen tender to touch    - SEVERE (8-10): excruciating pain, doubled over, unable to do any normal activities      Bottom of stomach, cramping, constant 6. PREGNANCY: "Could you be pregnant?" "Are you sexually active?" "Did you recently give birth?"     Positive pregnancy 7. BREASTFEEDING: "Are you breastfeeding?"      NA 8. HORMONES: "Are you taking any hormone medications, prescription or OTC?" (e.g., birth control pills, estrogen)     no 9. BLOOD THINNERS: "Do you take any blood thinners?" (e.g., Coumadin/warfarin, Pradaxa/dabigatran, aspirin)     no 10. CAUSE: "What do you think is causing the bleeding?" (e.g., recent gyn surgery, recent gyn procedure; known bleeding disorder, cervical cancer, polycystic ovarian disease, fibroids)         unsure 11. HEMODYNAMIC STATUS: "Are you weak or feeling lightheaded?" If Yes, ask: "Can you stand and walk normally?  At times      12. OTHER SYMPTOMS: "What other symptoms are you having with the bleeding?" (e.g., passed tissue, vaginal discharge, fever, menstrual-type cramps)       Cramps  Protocols used: Vaginal Bleeding - Abnormal-A-AH

## 2021-08-03 ENCOUNTER — Encounter (HOSPITAL_COMMUNITY): Payer: Self-pay | Admitting: Obstetrics & Gynecology

## 2021-08-03 ENCOUNTER — Inpatient Hospital Stay (HOSPITAL_COMMUNITY)
Admission: AD | Admit: 2021-08-03 | Discharge: 2021-08-03 | Disposition: A | Discharge status: Home / Self Care | Payer: Medicaid Other | Attending: Family Medicine | Admitting: Family Medicine

## 2021-08-03 DIAGNOSIS — O0933 Supervision of pregnancy with insufficient antenatal care, third trimester: Secondary | ICD-10-CM | POA: Diagnosis not present

## 2021-08-03 DIAGNOSIS — D649 Anemia, unspecified: Secondary | ICD-10-CM

## 2021-08-03 DIAGNOSIS — O99013 Anemia complicating pregnancy, third trimester: Secondary | ICD-10-CM

## 2021-08-03 DIAGNOSIS — O23593 Infection of other part of genital tract in pregnancy, third trimester: Secondary | ICD-10-CM | POA: Insufficient documentation

## 2021-08-03 DIAGNOSIS — O98313 Other infections with a predominantly sexual mode of transmission complicating pregnancy, third trimester: Secondary | ICD-10-CM | POA: Diagnosis not present

## 2021-08-03 DIAGNOSIS — Z3A28 28 weeks gestation of pregnancy: Secondary | ICD-10-CM

## 2021-08-03 DIAGNOSIS — O09293 Supervision of pregnancy with other poor reproductive or obstetric history, third trimester: Secondary | ICD-10-CM | POA: Diagnosis not present

## 2021-08-03 DIAGNOSIS — A5901 Trichomonal vulvovaginitis: Secondary | ICD-10-CM | POA: Diagnosis not present

## 2021-08-03 DIAGNOSIS — O26893 Other specified pregnancy related conditions, third trimester: Secondary | ICD-10-CM | POA: Diagnosis present

## 2021-08-03 DIAGNOSIS — R109 Unspecified abdominal pain: Secondary | ICD-10-CM | POA: Diagnosis not present

## 2021-08-03 DIAGNOSIS — A599 Trichomoniasis, unspecified: Secondary | ICD-10-CM

## 2021-08-03 LAB — CBC
HCT: 26.5 % — ABNORMAL LOW (ref 36.0–46.0)
Hemoglobin: 9.1 g/dL — ABNORMAL LOW (ref 12.0–15.0)
MCH: 27.9 pg (ref 26.0–34.0)
MCHC: 34.3 g/dL (ref 30.0–36.0)
MCV: 81.3 fL (ref 80.0–100.0)
Platelets: 149 10*3/uL — ABNORMAL LOW (ref 150–400)
RBC: 3.26 MIL/uL — ABNORMAL LOW (ref 3.87–5.11)
RDW: 13.6 % (ref 11.5–15.5)
WBC: 9.9 10*3/uL (ref 4.0–10.5)
nRBC: 0 % (ref 0.0–0.2)

## 2021-08-03 LAB — URINALYSIS, ROUTINE W REFLEX MICROSCOPIC
Bacteria, UA: NONE SEEN
Bilirubin Urine: NEGATIVE
Glucose, UA: NEGATIVE mg/dL
Hgb urine dipstick: NEGATIVE
Ketones, ur: NEGATIVE mg/dL
Nitrite: NEGATIVE
Protein, ur: NEGATIVE mg/dL
Specific Gravity, Urine: 1.008 (ref 1.005–1.030)
pH: 7 (ref 5.0–8.0)

## 2021-08-03 LAB — HIV ANTIBODY (ROUTINE TESTING W REFLEX): HIV Screen 4th Generation wRfx: NONREACTIVE

## 2021-08-03 LAB — TYPE AND SCREEN
ABO/RH(D): B POS
Antibody Screen: NEGATIVE

## 2021-08-03 LAB — HEMOGLOBIN A1C
Hgb A1c MFr Bld: 5.2 % (ref 4.8–5.6)
Mean Plasma Glucose: 102.54 mg/dL

## 2021-08-03 LAB — WET PREP, GENITAL
Clue Cells Wet Prep HPF POC: NONE SEEN
Sperm: NONE SEEN
WBC, Wet Prep HPF POC: NONE SEEN
Yeast Wet Prep HPF POC: NONE SEEN

## 2021-08-03 LAB — OB RESULTS CONSOLE RUBELLA ANTIBODY, IGM: Rubella: NON-IMMUNE/NOT IMMUNE

## 2021-08-03 MED ORDER — FERROUS SULFATE 325 (65 FE) MG PO TABS: 325.0000 mg | ORAL_TABLET | Freq: Every day | ORAL | 3 refills | Status: DC

## 2021-08-03 MED ORDER — METRONIDAZOLE 500 MG PO TABS
2000.0000 mg | ORAL_TABLET | Freq: Once | ORAL | Status: AC
  Administered 2021-08-03: 2000 mg via ORAL
  Filled 2021-08-03: qty 4

## 2021-08-03 MED ORDER — CYCLOBENZAPRINE HCL 5 MG PO TABS
5.0000 mg | ORAL_TABLET | Freq: Once | ORAL | Status: AC
  Administered 2021-08-03: 5 mg via ORAL
  Filled 2021-08-03: qty 1

## 2021-08-03 MED ORDER — ACETAMINOPHEN 500 MG PO TABS
1000.0000 mg | ORAL_TABLET | Freq: Once | ORAL | Status: AC
  Administered 2021-08-03: 1000 mg via ORAL
  Filled 2021-08-03: qty 2

## 2021-08-03 NOTE — MAU Note (Signed)
Patient reports to MAU c/o lower abdominal pain of 7 and HA. Denies vaginal bleeding, informs of on-going white vaginal discharge. Pt reports limited PNC. Endorses + FM. Pt place on FHM and VSS.

## 2021-08-03 NOTE — MAU Provider Note (Signed)
History     [id number]  Arrival date and time: 08/03/21 1525    Chief Complaint  Patient presents with   Abdominal Pain     HPI Amanda Russell is a 25 y.o. at [redacted]w[redacted]d by early [place] with PMHx notable for L ectopic pregnancy in 2021, extensive psych history, who presents for L sided abdominal pain.    Patient reports around two weeks of L sided abdominal pain Comes and goes Lasts for about 30 minutes Usually improves if she gets up and does something like take a shower No other symptoms No fever, vaginal discharge, burning or pain with urination Does not feel like contractions No vaginal bleeding or loss of fluid Normal fetal movement Very worried it might be something to do with her ectopic pregnancy  Patient reports she has not established prenatal care She has been very busy with work and is not planning on keeping the baby after delivery   --/--/B POS (11/03 1718)  OB History     Gravida  5   Para  3   Term  2   Preterm  1   AB  1   Living  3      SAB      IAB      Ectopic  1   Multiple  0   Live Births  3           Past Medical History:  Diagnosis Date   ADHD (attention deficit hyperactivity disorder), combined type 07/14/2012   Chlamydia    Preterm premature rupture of membranes (PPROM) with unknown onset of labor 07/13/2014   Suicidal ideation     Past Surgical History:  Procedure Laterality Date   LAPAROSCOPY Left 10/16/2019   Procedure: LAPAROSCOPIC LEFT SALPINGECTOMY;  Surgeon:  Bing, MD;  Location: [facility];  Service: Gynecology;  Laterality: Left;   NO PAST SURGERIES      Family History  Problem Relation Age of Onset   Hypertension Other    Cancer Neg Hx    Diabetes Neg Hx     Social History   Socioeconomic History   Marital status: Single    Spouse name: Not on file   Number of children: Not on file   Years of education: Not on file   Highest education level: Not on file  Occupational History    Occupation: Student    Comment: 10th grade at [employer]  Tobacco Use   Smoking status: Former    Packs/day: 0.50    Years: 0.80    Pack years: 0.40    Types: Cigarettes    Quit date: 01/07/2011    Years since quitting: 10.5   Smokeless tobacco: Never  Vaping Use   Vaping Use: Never used  Substance and Sexual Activity   Alcohol use: Not Currently    Comment: socially   Drug use: Not Currently    Frequency: 7.0 times per week    Types: Marijuana    Comment: denies pt states it has been years 02/18/20   Sexual activity: Yes    Partners: Male    Birth control/protection: None    Comment: Patient states her last time taking BCP was last year  Other Topics Concern   Not on file  Social History Narrative   Not on file   Social Determinants of Health   Financial Resource Strain: Not on file  Food Insecurity: Not on file  Transportation Needs: Not on file  Physical Activity: Not on file  Stress: Not on file  Social Connections: Not on file  Intimate Partner Violence: Not on file    Allergies  Allergen Reactions   Pollen Extract Itching and Cough    No current facility-administered medications on file prior to encounter.   Current Outpatient Medications on File Prior to Encounter  Medication Sig Dispense Refill   acetaminophen (TYLENOL) 325 MG tablet Take 2 tablets (650 mg total) by mouth every 4 (four) hours as needed (for pain scale < 4). 60 tablet 1   Doxylamine-Pyridoxine 10-10 MG TBEC Initial: Two tablets at bedtime on day 1 and 2; if symptoms persist, take 1 tablet in morning and 2 tablets at bedtime on day 3; if symptoms persist, may increase to 1 tablet in morning, 1 tablet mid-afternoon, and 2 tablets at bedtime on day 4 60 tablet 0   ondansetron (ZOFRAN ODT) 8 MG disintegrating tablet Take 1 tablet (8 mg total) by mouth every 8 (eight) hours as needed for nausea or vomiting. 20 tablet 0   pantoprazole (PROTONIX) 40 MG tablet Take 1 tablet (40 mg total) by mouth daily.  30 tablet 0   promethazine (PHENERGAN) 25 MG tablet Take 1 tablet (25 mg total) by mouth every 6 (six) hours as needed for nausea or vomiting. 30 tablet 0     ROS Pertinent positives and negative per HPI, all others reviewed and negative  Physical Exam   LMP 03/04/2021 (Within Days)   No data found.  Physical Exam Vitals reviewed.  Constitutional:      General: She is not in acute distress.    Appearance: She is well-developed. She is not diaphoretic.  Eyes:     General: No scleral icterus. Pulmonary:     Effort: Pulmonary effort is normal. No respiratory distress.  Abdominal:     General: There is no distension.     Palpations: Abdomen is soft.     Tenderness: There is no abdominal tenderness. There is no guarding or rebound.  Skin:    General: Skin is warm and dry.  Neurological:     Mental Status: She is alert.     Coordination: Coordination normal.     Cervical Exam    Bedside Ultrasound Not done  My interpretation: n/a  FHT Baseline 135, moderate variability, +accels, no decels Toco: quiet Cat: I  Labs Results for orders placed or performed during the hospital encounter of 08/03/21 (from the past 24 hour(s))  Urinalysis, Routine w reflex microscopic Urine, Clean Catch     Status: Abnormal   Collection Time: 08/03/21  3:57 PM  Result Value Ref Range   Color, Urine STRAW (A) YELLOW   APPearance CLEAR CLEAR   Specific Gravity, Urine 1.008 1.005 - 1.030   pH 7.0 5.0 - 8.0   Glucose, UA NEGATIVE NEGATIVE mg/dL   Hgb urine dipstick NEGATIVE NEGATIVE   Bilirubin Urine NEGATIVE NEGATIVE   Ketones, ur NEGATIVE NEGATIVE mg/dL   Protein, ur NEGATIVE NEGATIVE mg/dL   Nitrite NEGATIVE NEGATIVE   Leukocytes,Ua SMALL (A) NEGATIVE   RBC / HPF 0-5 0 - 5 RBC/hpf   WBC, UA 0-5 0 - 5 WBC/hpf   Bacteria, UA NONE SEEN NONE SEEN   Squamous Epithelial / LPF 0-5 0 - 5   Mucus PRESENT   Wet prep, genital     Status: Abnormal   Collection Time: 08/03/21  5:09 PM    Specimen: PATH Cytology Cervicovaginal Ancillary Only  Result Value Ref Range   Yeast Wet Prep HPF POC NONE  SEEN NONE SEEN   Trich, Wet Prep PRESENT (A) NONE SEEN   Clue Cells Wet Prep HPF POC NONE SEEN NONE SEEN   WBC, Wet Prep HPF POC NONE SEEN NONE SEEN   Sperm NONE SEEN   CBC     Status: Abnormal   Collection Time: 08/03/21  5:18 PM  Result Value Ref Range   WBC 9.9 4.0 - 10.5 K/uL   RBC 3.26 (L) 3.87 - 5.11 MIL/uL   Hemoglobin 9.1 (L) 12.0 - 15.0 g/dL   HCT 53.7 (L) 48.2 - 70.7 %   MCV 81.3 80.0 - 100.0 fL   MCH 27.9 26.0 - 34.0 pg   MCHC 34.3 30.0 - 36.0 g/dL   RDW 86.7 54.4 - 92.0 %   Platelets 149 (L) 150 - 400 K/uL   nRBC 0.0 0.0 - 0.2 %  Type and screen [facility]     Status: None   Collection Time: 08/03/21  5:18 PM  Result Value Ref Range   ABO/RH(D) B POS    Antibody Screen NEG    Sample Expiration      08/06/2021,2359 Performed at [facility], [address] [place], [place] [address]   Hemoglobin A1c     Status: None   Collection Time: 08/03/21  5:18 PM  Result Value Ref Range   Hgb A1c MFr Bld 5.2 4.8 - 5.6 %   Mean Plasma Glucose 102.54 mg/dL    Imaging No results found.  MAU Course  Procedures Lab Orders         Wet prep, genital         Culture, OB Urine         Urinalysis, Routine w reflex microscopic Urine, Clean Catch         CBC         HIV Antibody (routine testing w rflx)         Hepatitis B surface antibody         HCV Ab Reflex to Quant PCR         RPR         Rubella screen         Hemoglobin A1c    Meds ordered this encounter  Medications   acetaminophen (TYLENOL) tablet 1,000 mg   cyclobenzaprine (FLEXERIL) tablet 5 mg   metroNIDAZOLE (FLAGYL) tablet 2,000 mg   ferrous sulfate 325 (65 FE) MG tablet    Sig: Take 1 tablet (325 mg total) by mouth daily with breakfast.    Dispense:  90 tablet    Refill:  3   Imaging Orders  No imaging studies ordered today    MDM moderate  Assessment and Plan   #Abdominal pain in pregnancy, third trimester #History of ectopic pregnancy s/p L salpingectomy Consistent with advancing gestational age combined with possible stretching of scar tissue from her ectopic surgery. Unlikely to be round ligament based on history. Reassured patient no alarm signs, given tylenol and flexeril with good effect.   #Trichomonas Present on wet prep, given scant prenatal care treated with 2g flagyl in MAU prior to discharge, TOC at next visit.  #Pregnancy, 28 weeks #Anemia of pregnancy No prenatal care to date, basic OB panel obtained, offered and patient accepts referral to one of our clinics to establish care, message sent to [facility]. Also significant anemia with hgb 9.1, rx for iron sent.   #FWB FHT Cat I NST: Reactive  #Dispo Discharged to home in stable condition.  Venora Maples, MD/MPH 08/03/21 6:24 PM  Allergies as of 08/03/2021       Reactions   Pollen Extract Itching, Cough        Medication List     TAKE these medications    acetaminophen 325 MG tablet Commonly known as: Tylenol Take 2 tablets (650 mg total) by mouth every 4 (four) hours as needed (for pain scale < 4).   Doxylamine-Pyridoxine 10-10 MG Tbec Initial: Two tablets at bedtime on day 1 and 2; if symptoms persist, take 1 tablet in morning and 2 tablets at bedtime on day 3; if symptoms persist, may increase to 1 tablet in morning, 1 tablet mid-afternoon, and 2 tablets at bedtime on day 4   ferrous sulfate 325 (65 FE) MG tablet Take 1 tablet (325 mg total) by mouth daily with breakfast.   ondansetron 8 MG disintegrating tablet Commonly known as: Zofran ODT Take 1 tablet (8 mg total) by mouth every 8 (eight) hours as needed for nausea or vomiting.   pantoprazole 40 MG tablet Commonly known as: Protonix Take 1 tablet (40 mg total) by mouth daily.   promethazine 25 MG tablet Commonly known as: PHENERGAN Take 1 tablet (25 mg total) by mouth every 6 (six) hours as  needed for nausea or vomiting.

## 2021-08-04 LAB — SYPHILIS: RPR W/REFLEX TO RPR TITER AND TREPONEMAL ANTIBODIES, TRADITIONAL SCREENING AND DIAGNOSIS ALGORITHM: RPR Ser Ql: NONREACTIVE

## 2021-08-04 LAB — GC/CHLAMYDIA PROBE AMP (~~LOC~~) NOT AT ARMC
Chlamydia: NEGATIVE
Comment: NEGATIVE
Comment: NORMAL
Neisseria Gonorrhea: NEGATIVE

## 2021-08-04 LAB — HCV AB W REFLEX TO QUANT PCR: HCV Ab: <0.1 s/co ratio (ref 0.0–0.9)

## 2021-08-04 LAB — CULTURE, OB URINE

## 2021-08-04 LAB — HEPATITIS B SURFACE ANTIBODY, QUANTITATIVE: Hep B S AB Quant (Post): 8.2 m[IU]/mL — ABNORMAL LOW (ref >9.9)

## 2021-08-04 LAB — HCV INTERPRETATION

## 2021-08-04 LAB — RUBELLA SCREEN: Rubella: 5.61 index (ref >0.99)

## 2021-08-28 IMAGING — US US OB TRANSVAGINAL
1 series · 13 of 28 positions shown · non-contrast
Comparison: None.

CLINICAL DATA: Pelvic pain with nausea

EXAM:
OBSTETRIC <14 WK US AND TRANSVAGINAL OB US
TECHNIQUE: Both transabdominal and transvaginal ultrasound examinations were
performed for complete evaluation of the gestation as well as the
maternal uterus, adnexal regions, and pelvic cul-de-sac.
Transvaginal technique was performed to assess early pregnancy.

[Series 1: us ob transvaginal · 13 of 118 slices shown]
[im 5/118]
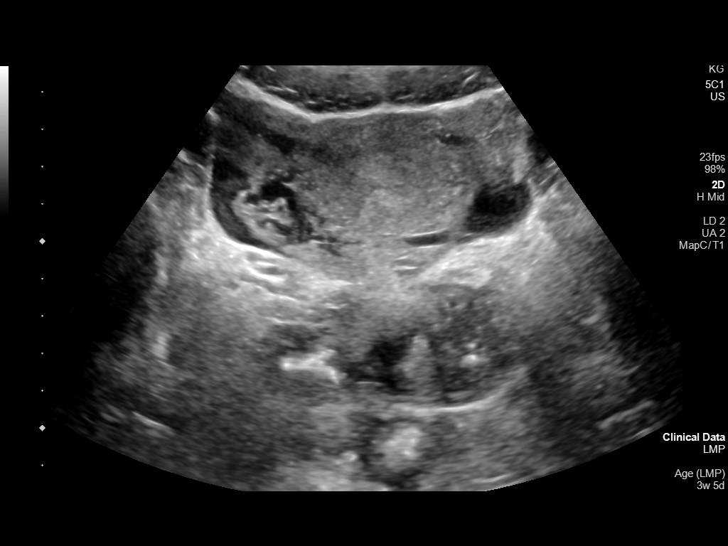
[im 14/118]
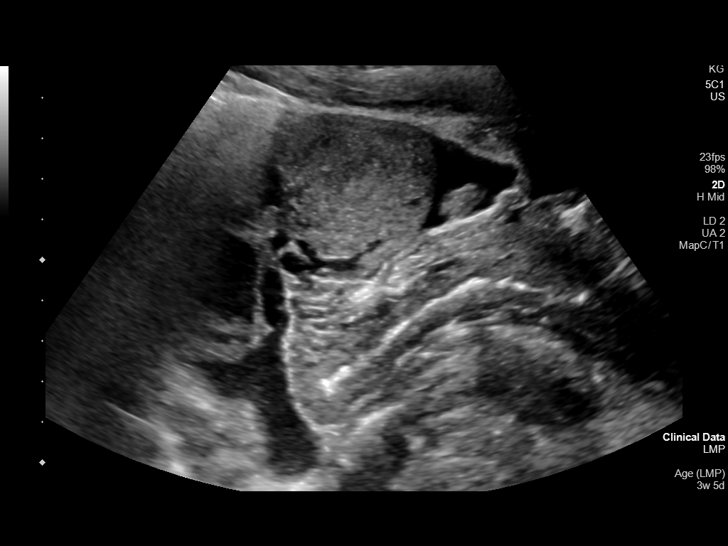
[im 22/118]
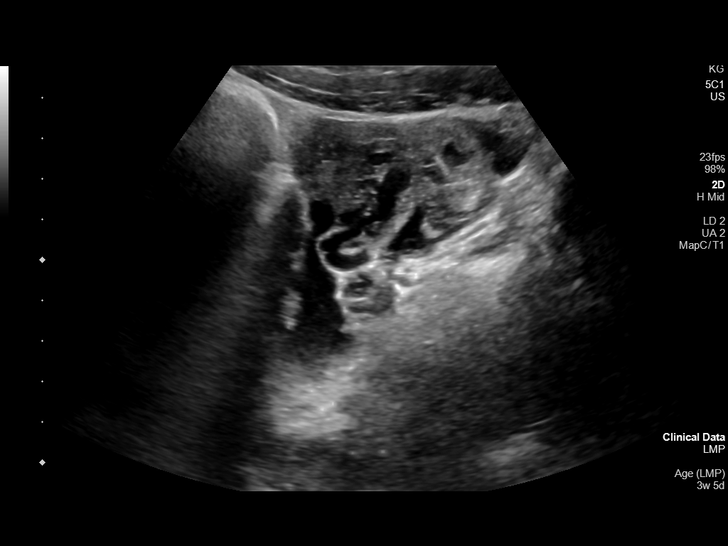
[im 31/118]
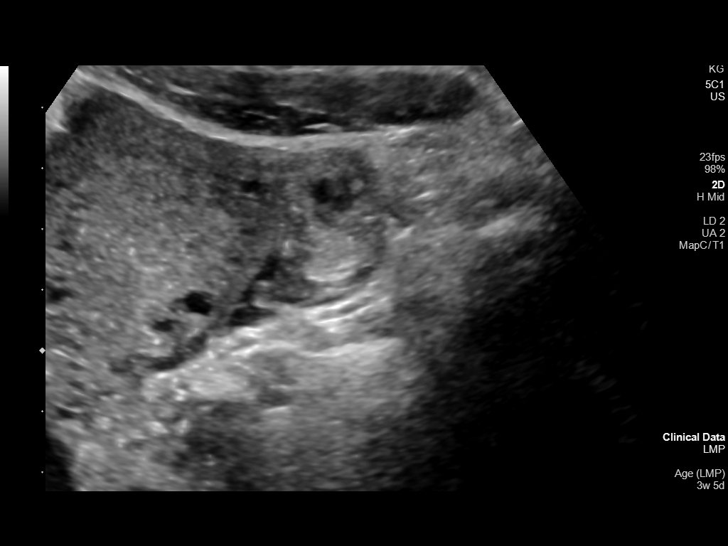
[im 40/118]
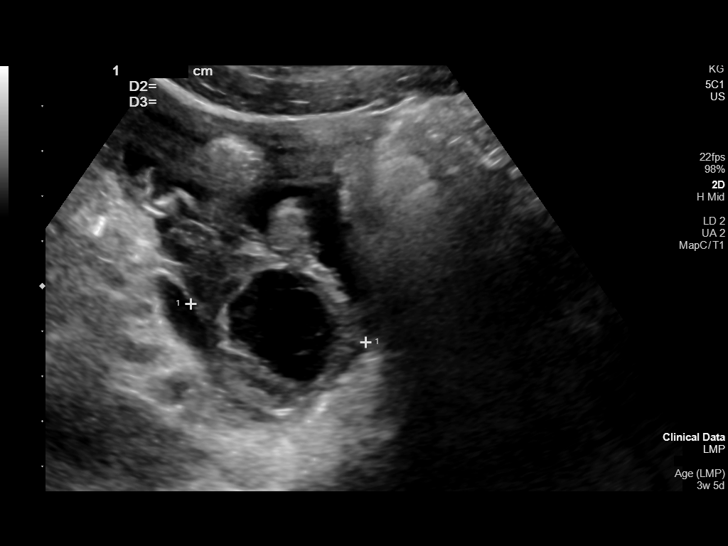
[im 48/118]
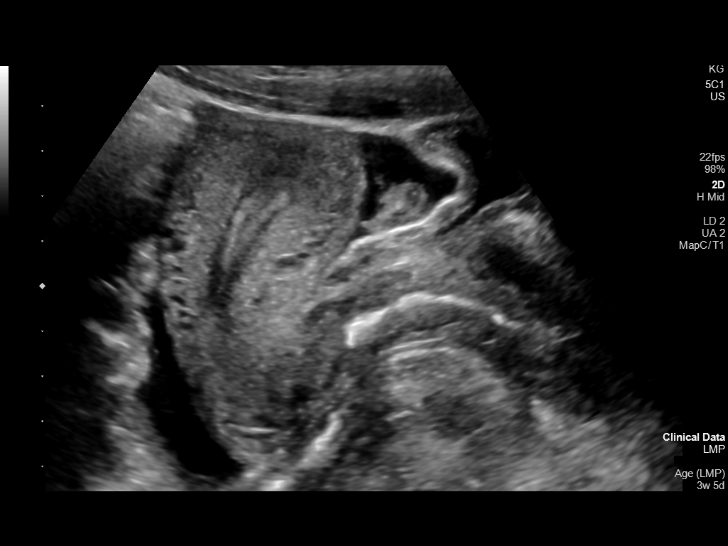
[im 61/118]
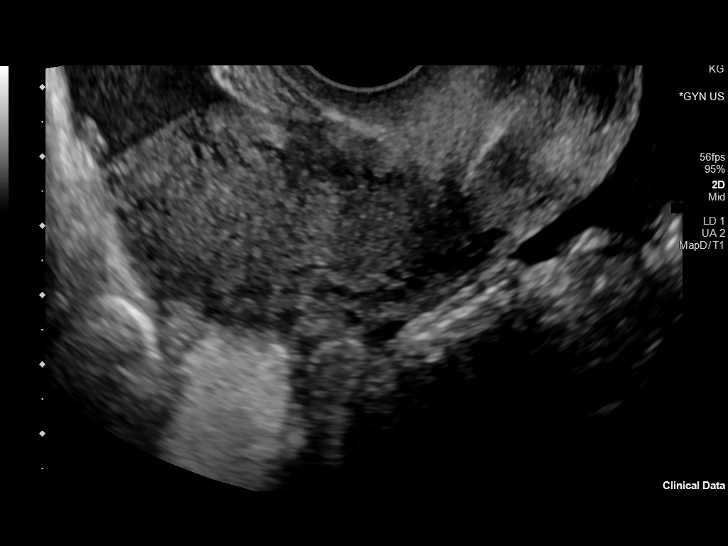
[im 70/118]
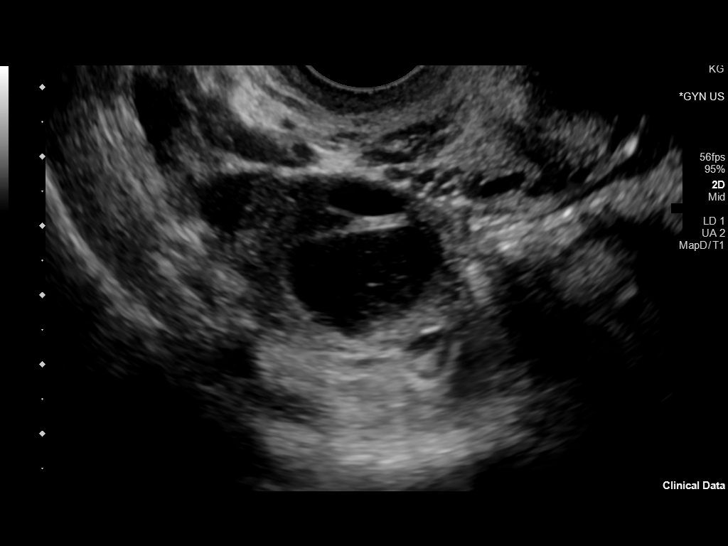
[im 79/118]
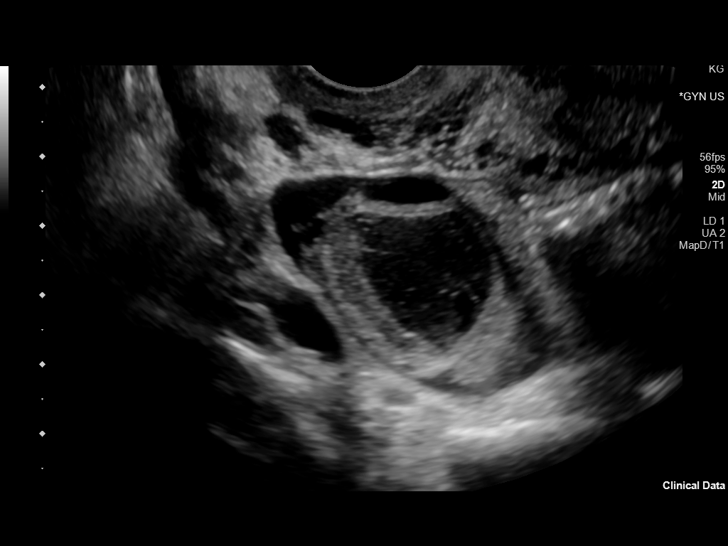
[im 87/118]
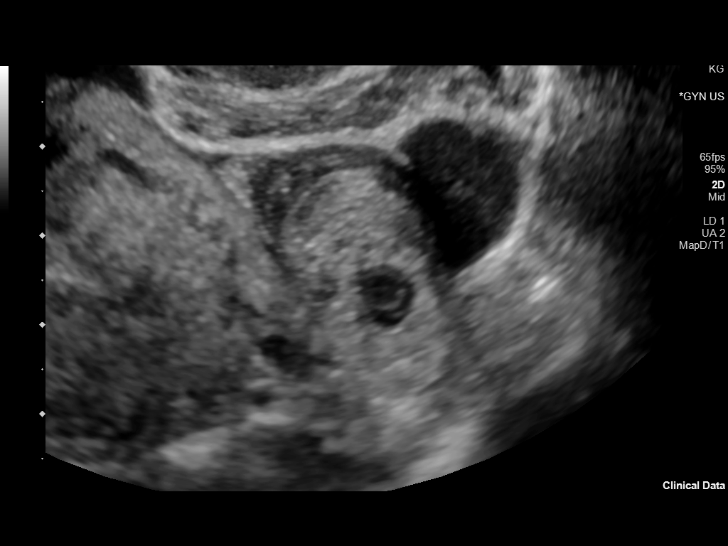
[im 96/118]
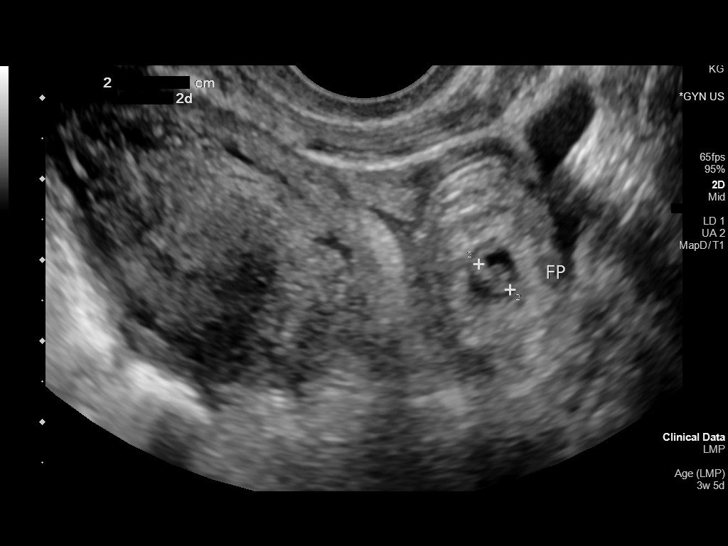
[im 105/118]
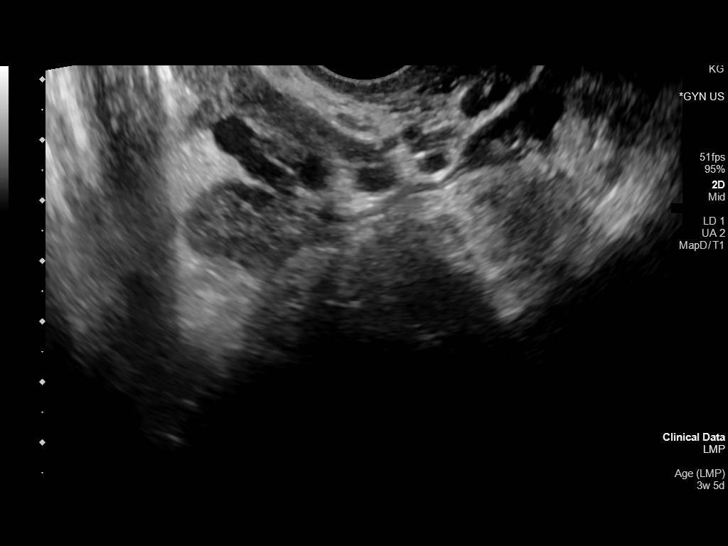
[im 113/118]
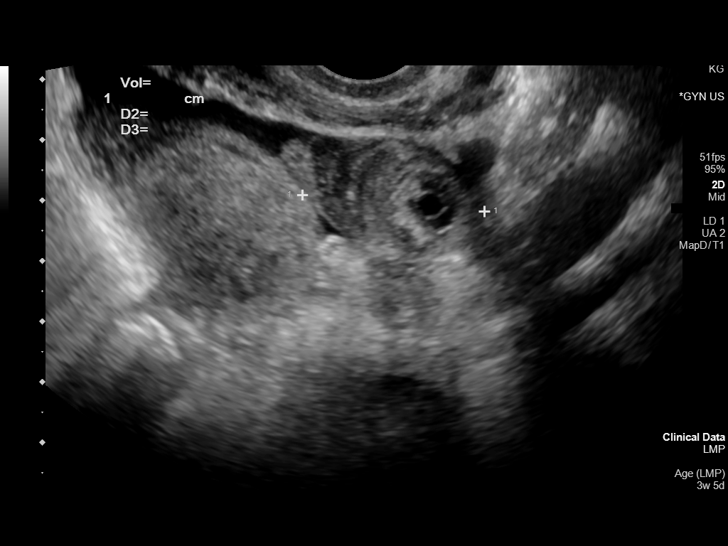

[13 of 28 positions shown; findings below may reference images not displayed]

FINDINGS: Intrauterine gestational sac: Not visualized

Yolk sac: Visualized in left adnexa

Embryo:  Visualized in left adnexa

Cardiac Activity: Visualized in left adnexa

Heart Rate: 177 bpm

CRL:  4 mm   6 w   1 d

Subchorionic hemorrhage:  None visualized.

Maternal uterus/adnexae: No intrauterine lesion is evident. Cervical
os is closed. There is a complex mass in the left adnexa measuring
2.6 x 3.9 x 2.5 cm with adjacent free fluid. Corpus luteum in right
ovary containing debris.
IMPRESSION: Live ectopic gestation in the left adnexal region with estimated
gestational age of 6 weeks. No intrauterine gestation.

Debris in right corpus luteum.

Critical Value/emergent results were called by telephone at the time
of interpretation on 10/16/2019 at [DATE] to Larousse Lydia ,
who verbally acknowledged these results.

## 2021-08-29 ENCOUNTER — Encounter: Payer: Medicaid Other | Admitting: Family Medicine

## 2021-09-09 ENCOUNTER — Inpatient Hospital Stay (HOSPITAL_COMMUNITY)
Admission: AD | Admit: 2021-09-09 | Discharge: 2021-09-11 | DRG: 805 | Disposition: A | Discharge status: Home / Self Care | Payer: Medicaid Other | Attending: Obstetrics and Gynecology | Admitting: Obstetrics and Gynecology

## 2021-09-09 ENCOUNTER — Encounter (HOSPITAL_COMMUNITY): Payer: Self-pay | Admitting: Obstetrics and Gynecology

## 2021-09-09 DIAGNOSIS — Z87891 Personal history of nicotine dependence: Secondary | ICD-10-CM | POA: Diagnosis not present

## 2021-09-09 DIAGNOSIS — O42913 Preterm premature rupture of membranes, unspecified as to length of time between rupture and onset of labor, third trimester: Principal | ICD-10-CM | POA: Diagnosis present

## 2021-09-09 DIAGNOSIS — Z23 Encounter for immunization: Secondary | ICD-10-CM

## 2021-09-09 DIAGNOSIS — Z3A33 33 weeks gestation of pregnancy: Secondary | ICD-10-CM

## 2021-09-09 DIAGNOSIS — F1290 Cannabis use, unspecified, uncomplicated: Secondary | ICD-10-CM | POA: Diagnosis present

## 2021-09-09 DIAGNOSIS — Z20822 Contact with and (suspected) exposure to covid-19: Secondary | ICD-10-CM | POA: Diagnosis present

## 2021-09-09 DIAGNOSIS — O99324 Drug use complicating childbirth: Secondary | ICD-10-CM | POA: Diagnosis present

## 2021-09-09 DIAGNOSIS — O42919 Preterm premature rupture of membranes, unspecified as to length of time between rupture and onset of labor, unspecified trimester: Secondary | ICD-10-CM

## 2021-09-09 DIAGNOSIS — O42013 Preterm premature rupture of membranes, onset of labor within 24 hours of rupture, third trimester: Secondary | ICD-10-CM | POA: Diagnosis not present

## 2021-09-09 DIAGNOSIS — Z3490 Encounter for supervision of normal pregnancy, unspecified, unspecified trimester: Secondary | ICD-10-CM

## 2021-09-09 HISTORY — DX: Preterm premature rupture of membranes, unspecified as to length of time between rupture and onset of labor, unspecified trimester: O42.919

## 2021-09-09 LAB — DIFFERENTIAL
Abs Immature Granulocytes: 0.04 10*3/uL (ref 0.00–0.07)
Basophils Absolute: 0 10*3/uL (ref 0.0–0.1)
Basophils Relative: 0 %
Eosinophils Absolute: 0 10*3/uL (ref 0.0–0.5)
Eosinophils Relative: 0 %
Immature Granulocytes: 0 %
Lymphocytes Relative: 26 %
Lymphs Abs: 2.8 10*3/uL (ref 0.7–4.0)
Monocytes Absolute: 0.9 10*3/uL (ref 0.1–1.0)
Monocytes Relative: 8 %
Neutro Abs: 6.9 10*3/uL (ref 1.7–7.7)
Neutrophils Relative %: 66 %

## 2021-09-09 LAB — CBC
HCT: 28.7 % — ABNORMAL LOW (ref 36.0–46.0)
Hemoglobin: 9.7 g/dL — ABNORMAL LOW (ref 12.0–15.0)
MCH: 26.5 pg (ref 26.0–34.0)
MCHC: 33.8 g/dL (ref 30.0–36.0)
MCV: 78.4 fL — ABNORMAL LOW (ref 80.0–100.0)
Platelets: 180 10*3/uL (ref 150–400)
RBC: 3.66 MIL/uL — ABNORMAL LOW (ref 3.87–5.11)
RDW: 13.2 % (ref 11.5–15.5)
WBC: 10.7 10*3/uL — ABNORMAL HIGH (ref 4.0–10.5)
nRBC: 0 % (ref 0.0–0.2)

## 2021-09-09 LAB — RESP PANEL BY RT-PCR (FLU A&B, COVID) ARPGX2
Influenza A by PCR: NEGATIVE
Influenza B by PCR: NEGATIVE
SARS Coronavirus 2 by RT PCR: NEGATIVE

## 2021-09-09 LAB — TYPE AND SCREEN
ABO/RH(D): B POS
Antibody Screen: NEGATIVE

## 2021-09-09 LAB — RAPID URINE DRUG SCREEN, HOSP PERFORMED
Amphetamines: NOT DETECTED
Barbiturates: NOT DETECTED
Benzodiazepines: NOT DETECTED
Cocaine: NOT DETECTED
Opiates: NOT DETECTED
Tetrahydrocannabinol: POSITIVE — AB

## 2021-09-09 LAB — RAPID HIV SCREEN (HIV 1/2 AB+AG)
HIV 1/2 Antibodies: NONREACTIVE
HIV-1 P24 Antigen - HIV24: NONREACTIVE

## 2021-09-09 LAB — HEMOGLOBIN A1C
Hgb A1c MFr Bld: 5.5 % (ref 4.8–5.6)
Mean Plasma Glucose: 111.15 mg/dL

## 2021-09-09 LAB — HEPATITIS B SURFACE ANTIGEN: Hepatitis B Surface Ag: NONREACTIVE

## 2021-09-09 MED ORDER — OXYTOCIN-SODIUM CHLORIDE 30-0.9 UT/500ML-% IV SOLN
INTRAVENOUS | Status: AC
  Filled 2021-09-09: qty 500

## 2021-09-09 MED ORDER — OXYTOCIN BOLUS FROM INFUSION
333.0000 mL | Freq: Once | INTRAVENOUS | Status: AC
  Administered 2021-09-09: 333 mL via INTRAVENOUS

## 2021-09-09 MED ORDER — LACTATED RINGERS IV SOLN: 500.0000 mL | INTRAVENOUS | Status: DC | PRN

## 2021-09-09 MED ORDER — OXYCODONE-ACETAMINOPHEN 5-325 MG PO TABS: 1.0000 | ORAL_TABLET | ORAL | Status: DC | PRN

## 2021-09-09 MED ORDER — SIMETHICONE 80 MG PO CHEW: 80.0000 mg | CHEWABLE_TABLET | ORAL | Status: DC | PRN

## 2021-09-09 MED ORDER — ACETAMINOPHEN 325 MG PO TABS
650.0000 mg | ORAL_TABLET | ORAL | Status: DC | PRN
  Administered 2021-09-09 – 2021-09-10 (×2): 650 mg via ORAL
  Filled 2021-09-09 (×2): qty 2

## 2021-09-09 MED ORDER — ACETAMINOPHEN 325 MG PO TABS
650.0000 mg | ORAL_TABLET | ORAL | Status: DC | PRN
  Administered 2021-09-09: 650 mg via ORAL
  Filled 2021-09-09: qty 2

## 2021-09-09 MED ORDER — LACTATED RINGERS IV SOLN: INTRAVENOUS | Status: DC

## 2021-09-09 MED ORDER — ONDANSETRON HCL 4 MG/2ML IJ SOLN
4.0000 mg | Freq: Four times a day (QID) | INTRAMUSCULAR | Status: DC | PRN
  Administered 2021-09-09: 4 mg via INTRAVENOUS
  Filled 2021-09-09: qty 2

## 2021-09-09 MED ORDER — OXYCODONE-ACETAMINOPHEN 5-325 MG PO TABS: 2.0000 | ORAL_TABLET | ORAL | Status: DC | PRN

## 2021-09-09 MED ORDER — OXYTOCIN-SODIUM CHLORIDE 30-0.9 UT/500ML-% IV SOLN: 2.5000 [IU]/h | INTRAVENOUS | Status: DC

## 2021-09-09 MED ORDER — BENZOCAINE-MENTHOL 20-0.5 % EX AERO: 1.0000 "application " | INHALATION_SPRAY | CUTANEOUS | Status: DC | PRN

## 2021-09-09 MED ORDER — OXYTOCIN BOLUS FROM INFUSION: 333.0000 mL | Freq: Once | INTRAVENOUS | Status: DC

## 2021-09-09 MED ORDER — PRENATAL MULTIVITAMIN CH
1.0000 | ORAL_TABLET | Freq: Every day | ORAL | Status: DC
  Administered 2021-09-10: 1 via ORAL
  Filled 2021-09-09 (×2): qty 1

## 2021-09-09 MED ORDER — FLEET ENEMA 7-19 GM/118ML RE ENEM: 1.0000 | ENEMA | RECTAL | Status: DC | PRN

## 2021-09-09 MED ORDER — COCONUT OIL OIL: 1.0000 "application " | TOPICAL_OIL | Status: DC | PRN

## 2021-09-09 MED ORDER — DIBUCAINE (PERIANAL) 1 % EX OINT: 1.0000 "application " | TOPICAL_OINTMENT | CUTANEOUS | Status: DC | PRN

## 2021-09-09 MED ORDER — IBUPROFEN 600 MG PO TABS
600.0000 mg | ORAL_TABLET | Freq: Four times a day (QID) | ORAL | Status: DC
  Administered 2021-09-09 – 2021-09-11 (×6): 600 mg via ORAL
  Filled 2021-09-09 (×6): qty 1

## 2021-09-09 MED ORDER — WITCH HAZEL-GLYCERIN EX PADS: 1.0000 "application " | MEDICATED_PAD | CUTANEOUS | Status: DC | PRN

## 2021-09-09 MED ORDER — MAGNESIUM HYDROXIDE 400 MG/5ML PO SUSP: 30.0000 mL | ORAL | Status: DC | PRN

## 2021-09-09 MED ORDER — SOD CITRATE-CITRIC ACID 500-334 MG/5ML PO SOLN: 30.0000 mL | ORAL | Status: DC | PRN

## 2021-09-09 MED ORDER — LIDOCAINE HCL (PF) 1 % IJ SOLN: 30.0000 mL | INTRAMUSCULAR | Status: DC | PRN

## 2021-09-09 MED ORDER — TRANEXAMIC ACID-NACL 1000-0.7 MG/100ML-% IV SOLN: 1000.0000 mg | Freq: Once | INTRAVENOUS | Status: DC | PRN

## 2021-09-09 MED ORDER — TRANEXAMIC ACID-NACL 1000-0.7 MG/100ML-% IV SOLN
INTRAVENOUS | Status: AC
  Filled 2021-09-09: qty 100

## 2021-09-09 MED ORDER — MEASLES, MUMPS & RUBELLA VAC IJ SOLR
0.5000 mL | Freq: Once | INTRAMUSCULAR | Status: AC
  Administered 2021-09-11: 0.5 mL via SUBCUTANEOUS
  Filled 2021-09-09: qty 0.5

## 2021-09-09 MED ORDER — TETANUS-DIPHTH-ACELL PERTUSSIS 5-2.5-18.5 LF-MCG/0.5 IM SUSY
0.5000 mL | PREFILLED_SYRINGE | Freq: Once | INTRAMUSCULAR | Status: AC
  Administered 2021-09-11: 0.5 mL via INTRAMUSCULAR
  Filled 2021-09-09: qty 0.5

## 2021-09-09 MED ORDER — TRANEXAMIC ACID-NACL 1000-0.7 MG/100ML-% IV SOLN
1000.0000 mg | INTRAVENOUS | Status: AC
  Administered 2021-09-09: 1000 mg via INTRAVENOUS

## 2021-09-09 MED ORDER — IBUPROFEN 600 MG PO TABS
600.0000 mg | ORAL_TABLET | Freq: Once | ORAL | Status: AC
  Administered 2021-09-09: 600 mg via ORAL
  Filled 2021-09-09: qty 1

## 2021-09-09 NOTE — H&P (Signed)
Amanda Russell is a 25 y.o. female presenting for preterm rupture of membranes and precipitous labor. Patient endorses feeling a gush of fluid at 0500. She stayed at work until 0700 to finish her shift. Her contractions intensified throughout the day. She has not received prenatal care in this pregnancy.  OB History     Gravida  5   Para  3   Term  2   Preterm  1   AB  1   Living  3      SAB      IAB      Ectopic  1   Multiple  0   Live Births  3          Past Medical History:  Diagnosis Date   ADHD (attention deficit hyperactivity disorder), combined type 07/14/2012   Chlamydia    Preterm premature rupture of membranes (PPROM) with unknown onset of labor 07/13/2014   Suicidal ideation    Past Surgical History:  Procedure Laterality Date   LAPAROSCOPY Left 10/16/2019   Procedure: LAPAROSCOPIC LEFT SALPINGECTOMY;  Surgeon: [place] Bing, MD;  Location: [facility];  Service: Gynecology;  Laterality: Left;   NO PAST SURGERIES     Family History: family history includes Hypertension in an other family member. Social History:  reports that she quit smoking about 10 years ago. Her smoking use included cigarettes. She has a 0.40 pack-year smoking history. She has never used smokeless tobacco. She reports that she does not currently use alcohol. She reports that she does not currently use drugs after having used the following drugs: Marijuana. Frequency: 7.00 times per week.     Maternal Diabetes: Unknown Genetic Screening: Declined Maternal Ultrasounds/Referrals: Other: First trimester dating scan 03/15/21, no other imaging Fetal Ultrasounds or other Referrals:  None Maternal Substance Abuse:  UDS ordered on admission Significant Maternal Medications:  Significant psych history, unknown if currently on medication Significant Maternal Lab Results:  N/A Other Comments:    Review of Systems  Gastrointestinal:  Positive for abdominal pain.  Genitourinary:  Positive  for vaginal discharge.  All other systems reviewed and are negative.  Dilation: 7 Exam by:: S Veleria Barnhardt Blood pressure 132/75, pulse 62, temperature 97.8 F (36.6 C), temperature source Oral, resp. rate 19, height 5\' 5"  (1.651 m), last menstrual period 03/04/2021, SpO2 99 %, unknown if currently breastfeeding. Exam Physical Exam Vitals and nursing note reviewed. Exam conducted with a chaperone present.  Constitutional:      General: She is in acute distress.     Appearance: Normal appearance.  Cardiovascular:     Rate and Rhythm: Normal rate.     Pulses: Normal pulses.  Pulmonary:     Effort: Pulmonary effort is normal.  Abdominal:     Comments: Gravid  Neurological:     Mental Status: She is alert and oriented to person, place, and time.    Prenatal labs: ABO, Rh: --/--/B POS (12/10 1426) Antibody: NEG (12/10 1426) Rubella: 5.61 (11/03 1718) RPR: NON REACTIVE (11/03 1718)  HBsAg:    HIV: NON REACTIVE (12/10 1422)  GBS:   Unknown  Assessment/Plan: --25 y.o. 26 at [redacted]w[redacted]d by 8 weeks ultrasound  --No prenatal care in current pregnancy --PPROM, Precipitous preterm birth shortly after arrival to [facility] --boy/both/undecided but willing to discuss Nexplanon   Postpartum Planning --Inpatient SW consult  [facility], CNM 09/09/2021, 3:32 PM

## 2021-09-09 NOTE — Discharge Summary (Signed)
Postpartum Discharge Summary      Patient Name: Amanda Russell DOB: [birth year] MRN: [id number]  Date of admission: 09/09/2021 Delivery date:09/09/2021  Delivering provider: Calvert Cantor  Date of discharge: 09/11/2021  Admitting diagnosis: Pregnant [Z34.90] Preterm premature rupture of membranes [O42.919] Intrauterine pregnancy: [redacted]w[redacted]d     Secondary diagnosis:  Principal Problem:   Pregnant Active Problems:   Preterm premature rupture of membranes  Additional problems: No prenatal care, THC + on admission    Discharge diagnosis: Preterm Pregnancy Delivered                                              Post partum procedures: Transition of care consult Augmentation: N/A Complications: None  Hospital course: Onset of Labor With Vaginal Delivery      25 y.o. yo [id number] at [redacted]w[redacted]d was admitted in Active Preterm Labor on 09/09/2021. Patient had an uncomplicated labor course as follows:  Membrane Rupture Time/Date: 5:00 AM ,09/09/2021   Delivery Method:Vaginal, Spontaneous . This was precipitous and happened in the MAU. Episiotomy: None  Lacerations:  None  Patient had an uncomplicated postpartum course.  She is ambulating, tolerating a regular diet, passing flatus, and urinating well. Patient is discharged home in stable condition on 09/11/21.  Newborn Data: Birth date:09/09/2021  Birth time:2:15 PM  Gender:Female  Living status:Living  Apgars:8 ,9  Weight:2080 g   Physical exam  Vitals:   09/10/21 1926 09/10/21 2322 09/11/21 0515 09/11/21 0804  BP: 103/63 (!) 101/56 (!) 106/49 121/67  Pulse: 74 (!) 57 (!) 56 69  Resp: 18 18 18 18   Temp: 97.8 F (36.6 C) 97.8 F (36.6 C) 98 F (36.7 C) 98.3 F (36.8 C)  TempSrc: Oral Oral Oral Oral  SpO2: 100% 100% 100% 99%  Weight:      Height:       General: alert, cooperative, and no distress Lochia: appropriate Uterine Fundus: firm Incision: N/A DVT Evaluation: No evidence of DVT seen on physical exam. Negative Homan's  sign. No cords or calf tenderness. Labs: Lab Results  Component Value Date   WBC 10.7 (H) 09/09/2021   HGB 9.7 (L) 09/09/2021   HCT 28.7 (L) 09/09/2021   MCV 78.4 (L) 09/09/2021   PLT 180 09/09/2021   CMP Latest Ref Rng & Units 04/13/2021  Glucose 70 - 99 mg/dL 04/15/2021)  BUN 6 - 20 mg/dL 5(L)  Creatinine 643(P - 1.00 mg/dL 2.95  Sodium 1.88 - 416 mmol/L 138  Potassium 3.5 - 5.1 mmol/L 3.0(L)  Chloride 98 - 111 mmol/L 105  CO2 22 - 32 mmol/L 20(L)  Calcium 8.9 - 10.3 mg/dL 9.8  Total Protein 6.5 - 8.1 g/dL 606)  Total Bilirubin 0.3 - 1.2 mg/dL 0.7  Alkaline Phos 38 - 126 U/L 57  AST 15 - 41 U/L 22  ALT 0 - 44 U/L 17   Edinburgh Score: Edinburgh Postnatal Depression Scale Screening Tool 09/11/2021  I have been able to laugh and see the funny side of things. 0  I have looked forward with enjoyment to things. 0  I have blamed myself unnecessarily when things went wrong. 0  I have been anxious or worried for no good reason. 0  I have felt scared or panicky for no good reason. 0  Things have been getting on top of me. 0  I have been so unhappy that I have had  difficulty sleeping. 0  I have felt sad or miserable. 0  I have been so unhappy that I have been crying. 0  The thought of harming myself has occurred to me. 0  Edinburgh Postnatal Depression Scale Total 0     After visit meds:  Allergies as of 09/11/2021       Reactions   Pollen Extract Itching, Cough        Medication List     STOP taking these medications    ADVIL PM PO   Doxylamine-Pyridoxine 10-10 MG Tbec   ferrous sulfate 325 (65 FE) MG tablet   ondansetron 8 MG disintegrating tablet Commonly known as: Zofran ODT   pantoprazole 40 MG tablet Commonly known as: Protonix   promethazine 25 MG tablet Commonly known as: PHENERGAN       TAKE these medications    acetaminophen 325 MG tablet Commonly known as: Tylenol Take 2 tablets (650 mg total) by mouth every 4 (four) hours as needed (for pain  scale < 4).   ibuprofen 600 MG tablet Commonly known as: ADVIL Take 1 tablet (600 mg total) by mouth every 6 (six) hours as needed for mild pain, headache or moderate pain.         Discharge home in stable condition Infant Feeding: Breast Infant Disposition:NICU Discharge instruction: per After Visit Summary and Postpartum booklet. Activity: Advance as tolerated. Pelvic rest for 6 weeks.  Diet: routine diet Future Appointments: 2 weeks and 4 weeks at [facility]  Follow up Visit:  Follow-up Information     [facility] Follow up.   Specialty: Obstetrics and Gynecology Why: You will be called with appointment date and time for postpartum appointments Contact information: [address] [place] [address] [phone]               Message sent by Reita Cliche, Ina Homes 09/09/2021  Please keep existing 12/29 appointment (currently New OB) and change to mood check with provider  Please schedule this patient for a In person postpartum visit Additional Postpartum F/U:Postpartum Depression checkup  High risk pregnancy complicated by:  No prenatal care, PPROM, preterm delivery Delivery mode:  Vaginal, Spontaneous  Anticipated Birth Control:  Unsure   09/11/2021 Jaynie Collins, MD

## 2021-09-09 NOTE — MAU Note (Signed)
Med emergency call to the front.  Pt in wc, screaming.  4th baby, water broke at 0500. States due date is1/23/23; NO prenatal care.

## 2021-09-09 NOTE — Progress Notes (Signed)
CNM returned to bedside to check on patient. Patient is very tearful. Verbalizes to [employer], RN that she is very dissatisfied with how she has been treated since admission. Reports hearing multiple team members being rude to her. Patient states she was hoping for a calm birthing experience including epidural.   Reframing attempted in light of patient's arrival and immediate emergency code, preterm labor, precipitous delivery. CNM offered to page House Coverage to bedside. Pt declines.  Clayton Bibles, MSA, MSN, CNM Certified Nurse Midwife, Biochemist, clinical for [employer], [facility]

## 2021-09-10 ENCOUNTER — Other Ambulatory Visit: Payer: Self-pay

## 2021-09-10 LAB — RPR: RPR Ser Ql: NONREACTIVE

## 2021-09-10 MED ORDER — MEDROXYPROGESTERONE ACETATE 150 MG/ML IM SUSP: 150.0000 mg | Freq: Once | INTRAMUSCULAR | Status: DC

## 2021-09-10 MED ORDER — OXYCODONE-ACETAMINOPHEN 5-325 MG PO TABS
1.0000 | ORAL_TABLET | Freq: Four times a day (QID) | ORAL | Status: DC | PRN
Start: 2021-09-10 — End: 2021-09-11
  Administered 2021-09-10 (×2): 1 via ORAL
  Filled 2021-09-10 (×2): qty 1

## 2021-09-10 NOTE — Lactation Note (Signed)
This note was copied from a baby's chart. Lactation Consultation Note  Patient Name: Boy Wana Mount [id number] Date: 09/10/2021   Age:24 hours  LC orders received and acknowledged. Spoke to [facility] care RN Selena Batten and she voiced that mother hasn't started pumping yet, they're waiting for Social worker consult since there is CPS involvement with her other children. Feeding choice on admission is unclear, it states both "formula" and "breast/formula". RN Selena Batten to notify [facility] if mom requests pumping, baby is currently on NPO in the NICU.   Ruie Sendejo S Seamus Warehime 09/10/2021, 10:26 AM

## 2021-09-10 NOTE — Progress Notes (Signed)
Post Partum Day 1 SVD @ 33 4/7 weeks, SROM, NPNC, H/O substance abuse  Subjective: no complaints  Objective: Blood pressure 117/66, pulse 61, temperature 98 F (36.7 C), temperature source Oral, resp. rate 18, height 5\' 5"  (1.651 m), weight 66.1 kg, last menstrual period 03/04/2021, SpO2 100 %, unknown if currently breastfeeding.  Physical Exam:  General: alert Lochia: appropriate Uterine Fundus: firm Incision: NA DVT Evaluation: No evidence of DVT seen on physical exam.  Recent Labs    09/09/21 1421  HGB 9.7*  HCT 28.7*    Assessment/Plan: Plan for discharge tomorrow. Depo Provera today. Considering out pt IUD. SW consult pending. Infant stable in NICU per mother's report   LOS: 1 day   14/10/22 09/10/2021, 12:09 PM

## 2021-09-11 ENCOUNTER — Encounter: Payer: Self-pay | Admitting: Obstetrics & Gynecology

## 2021-09-11 LAB — GC/CHLAMYDIA PROBE AMP (~~LOC~~) NOT AT ARMC
Chlamydia: NEGATIVE
Comment: NEGATIVE
Comment: NORMAL
Neisseria Gonorrhea: NEGATIVE

## 2021-09-11 MED ORDER — IBUPROFEN 600 MG PO TABS: 600.0000 mg | ORAL_TABLET | Freq: Four times a day (QID) | ORAL | 2 refills | Status: DC | PRN

## 2021-09-11 NOTE — Progress Notes (Signed)
Discharge information given to patient including follow-up appointment, medications, pain, when to call the provider, services available, and Tdap and MMR vaccine (given). Patient verbalized understanding and all questions answered. Patient is alert and oriented x4, ambulatory, VS WNL, and patient states no pain. Patient states waiting for ride and will call nurses station when ready to go.

## 2021-09-11 NOTE — Lactation Note (Signed)
This note was copied from a baby's chart. Lactation Consultation Note Mother is not pumping and does not intend to pump, per her RN in [facility]. RN Olegario Messier) will notify [facility] team if there is a need for f/u. LC services are complete at this time.   Patient Name: Amanda Russell [id number] Date: 09/11/2021   Age:24 hours   Elder Negus 09/11/2021, 9:08 AM

## 2021-09-11 NOTE — Plan of Care (Signed)
  Problem: Education: Goal: Knowledge of condition will improve Outcome: Adequate for Discharge   Problem: Activity: Goal: Will verbalize the importance of balancing activity with adequate rest periods Outcome: Adequate for Discharge Goal: Ability to tolerate increased activity will improve Outcome: Adequate for Discharge   Problem: Coping: Goal: Ability to identify and utilize available resources and services will improve Outcome: Adequate for Discharge   Problem: Life Cycle: Goal: Chance of risk for complications during the postpartum period will decrease Outcome: Adequate for Discharge   Problem: Skin Integrity: Goal: Demonstration of wound healing without infection will improve Outcome: Adequate for Discharge   Problem: Education: Goal: Knowledge of General Education information will improve Description: Including pain rating scale, medication(s)/side effects and non-pharmacologic comfort measures Outcome: Adequate for Discharge   Problem: Health Behavior/Discharge Planning: Goal: Ability to manage health-related needs will improve Outcome: Adequate for Discharge   Problem: Clinical Measurements: Goal: Ability to maintain clinical measurements within normal limits will improve Outcome: Adequate for Discharge Goal: Will remain free from infection Outcome: Adequate for Discharge Goal: Diagnostic test results will improve Outcome: Adequate for Discharge Goal: Respiratory complications will improve Outcome: Adequate for Discharge Goal: Cardiovascular complication will be avoided Outcome: Adequate for Discharge   Problem: Activity: Goal: Risk for activity intolerance will decrease Outcome: Adequate for Discharge   Problem: Nutrition: Goal: Adequate nutrition will be maintained Outcome: Adequate for Discharge   Problem: Coping: Goal: Level of anxiety will decrease Outcome: Adequate for Discharge   Problem: Elimination: Goal: Will not experience complications  related to bowel motility Outcome: Adequate for Discharge Goal: Will not experience complications related to urinary retention Outcome: Adequate for Discharge   Problem: Pain Managment: Goal: General experience of comfort will improve Outcome: Adequate for Discharge   Problem: Safety: Goal: Ability to remain free from injury will improve Outcome: Adequate for Discharge   Problem: Skin Integrity: Goal: Risk for impaired skin integrity will decrease Outcome: Adequate for Discharge

## 2021-09-12 LAB — SURGICAL PATHOLOGY

## 2021-09-12 LAB — RUBELLA SCREEN: Rubella: 2.65 index (ref >0.99)

## 2021-09-13 ENCOUNTER — Ambulatory Visit: Payer: Self-pay

## 2021-09-13 NOTE — Lactation Note (Signed)
This note was copied from a baby's chart.  NICU Lactation Consultation Note  Patient Name: Amanda Russell IHKVQ'Q Date: 09/13/2021 Age:24 days   Subjective Reason for consult: Initial assessment; NICU baby; Engorgement LC was notified by NICU RN that mom would like to pump for her baby. This LC was previously told that mom did not intend to pump/bf. Mother informed LC this morning that she thought she needed "a pill" before she could begin pumping. She is a p4 but did not bf or pump with other children.   Mother was full and uncomfortable before pumping but endorsed relief p pumping.   Mother denies hx breast surgery/trauma.  LC provided ed and assisted with initial pumping of large volume of EBM.  Objective Infant data: Mother's Current Feeding Choice: Breast Milk  Infant feeding assessment Scale for Readiness: 3  Maternal data: [id number]  Vaginal, Spontaneous Does the patient have breastfeeding experience prior to this delivery?: No  Pumping frequency: first pumping Pumped volume: 360 mL   WIC Program: Yes WIC Referral Sent?: Yes  Assessment Maternal: Mother at risk for mastitis because of engorgement and no pumping in first 3 days p delivery. She remains at risk because she does not have a pump to use at home.   Intervention/Plan Interventions: Education; Breast feeding basics reviewed Ed on manual pump to use prn until mom obtains a pump. Tools: Pump Pump Education: Setup, frequency, and cleaning; Milk Storage  Plan: Consult Status: Follow-up  NICU Follow-up type: Weekly NICU follow up; Verify absence of engorgement  LC will send fax to [facility] for pump issuance.  Mom will begin pumping q3  Elder Negus 09/13/2021, 7:53 AM

## 2021-09-13 NOTE — Lactation Note (Signed)
This note was copied from a baby's chart. Lactation Consultation Note  [facility] referral faxed.  Milk lab aware that mom is now providing milk.   Patient Name: Amanda Russell [id number] Date: 09/13/2021 Age:24 days   Elder Negus 09/13/2021, 8:46 AM

## 2021-09-14 ENCOUNTER — Ambulatory Visit: Payer: Self-pay

## 2021-09-14 NOTE — Lactation Note (Signed)
This note was copied from a baby's chart. Lactation Consultation Note  Patient Name: Amanda Russell Date: 09/14/2021 Reason for consult: Follow-up assessment;Mother's request;1st time breastfeeding;NICU baby;Preterm <34wks Age:24 days  Lactation called to room to review pump settings. [name] will pick up her DEBP from [facility] today at 1500. I reviewed milk storage guidelines and recommended that she pump q3 hours for 15-20 minutes.   Feeding Mother's Current Feeding Choice: Breast Milk   Lactation Tools Discussed/Used Tools: Pump Breast pump type: Double-Electric Breast Pump Pump Education: Setup, frequency, and cleaning;Milk Storage Reason for Pumping: NICU Pumping frequency: rec q3 hours - has not pumped since yesterday  Interventions Interventions: Breast feeding basics reviewed;DEBP;Education  Discharge Pump: DEBP WIC Program: Yes  Consult Status Consult Status: Follow-up Date: 09/14/21 Follow-up type: In-patient    Walker Shadow 09/14/2021, 12:59 PM

## 2021-09-14 NOTE — BH Specialist Note (Signed)
Integrated Behavioral Health via Telemedicine Visit  09/14/2021 MERIEL KELLIHER [id number]  Number of Integrated Behavioral Health visits: 1 Session Start time: 9:14  Session End time: 9:39 Total time:  25  Referring Provider: Jaynie Collins, [facility] Patient/Family location: Home [facility] Provider location: Center for [facility] at [facility]  All persons participating in visit: Patient Amanda Russell and [facility] Shariya Gaster   Types of Service: Individual psychotherapy and Video visit  I connected with Vincente Poli and/or Martie Lee C Lafevers's  n/a  via  Telephone or Video Enabled Telemedicine Application  (Video is Caregility application) and verified that I am speaking with the correct person using two identifiers. Discussed confidentiality: Yes   I discussed the limitations of telemedicine and the availability of in person appointments.  Discussed there is a possibility of technology failure and discussed alternative modes of communication if that failure occurs.  I discussed that engaging in this telemedicine visit, they consent to the provision of behavioral healthcare and the services will be billed under their insurance.  Patient and/or legal guardian expressed understanding and consented to Telemedicine visit: Yes   Presenting Concerns: Patient and/or family reports the following symptoms/concerns: Mild fatigue, Stress over CPS case with baby in NICU, expects baby home by March; pt returned to work one week postpartum, begins classes at [facility] in January, has EBT and [facility]; pt requests birth control today; open to receiving resources on AVS, and no other concern today. Duration of problem: Postpartum; Severity of problem: mild  Patient and/or Family's Strengths/Protective Factors: Concrete supports in place (healthy food, safe environments, etc.) and Sense of purpose  Goals Addressed: Patient will:  Reduce symptoms of: stress   Increase knowledge and/or  ability of: healthy habits and stress reduction   Demonstrate ability to: Increase healthy adjustment to current life circumstances  Progress towards Goals: Ongoing  Interventions: Interventions utilized:  Solution-Focused Strategies, Psychoeducation and/or Health Education, and Link to [employer] Standardized Assessments completed: GAD-7 and PHQ 9  Patient and/or Family Response: Pt agrees with treatment plan  Assessment: Patient currently experiencing Other specified counseling and Psychosocial stress.   Patient may benefit from psychoeducation and brief therapeutic interventions regarding coping with symptoms of current life stress .  Plan: Follow up with behavioral health clinician on : Call Stacie Templin at [phone], as needed. Behavioral recommendations:  -Continue attending scheduled therapy sessions, as required for CPS case  -Begin taking vitamins given via [facility] after discussing with medical provider today -Consider additional community resources on After Visit Summary, as discussed Referral(s): Integrated Art gallery manager (In Clinic) and [employer]:  Transportation and etc  I discussed the assessment and treatment plan with the patient and/or parent/guardian. They were provided an opportunity to ask questions and all were answered. They agreed with the plan and demonstrated an understanding of the instructions.   They were advised to call back or seek an in-person evaluation if the symptoms worsen or if the condition fails to improve as anticipated.  Rae Lips, LCSW  Depression screen [facility] 2/9 09/28/2021  Decreased Interest 0  Down, Depressed, Hopeless 0  PHQ - 2 Score 0  Altered sleeping 0  Tired, decreased energy 1  Change in appetite 0  Feeling bad or failure about yourself  0  Trouble concentrating 0  Moving slowly or fidgety/restless 0  Suicidal thoughts 0  PHQ-9 Score 1   GAD 7 : Generalized Anxiety Score 09/28/2021  Nervous,  Anxious, on Edge 0  Control/stop worrying 0  Worry too much - different things 0  Trouble relaxing 0  Restless 0  Easily annoyed or irritable 0  Afraid - awful might happen 0  Total GAD 7 Score 0    Edinburgh Postnatal Depression Scale Screening Tool 09/11/2021 09/10/2021 09/05/2020 09/04/2020  I have been able to laugh and see the funny side of things. 0 (No Data) 0 (No Data)  I have looked forward with enjoyment to things. 0 - 0 -  I have blamed myself unnecessarily when things went wrong. 0 - 0 -  I have been anxious or worried for no good reason. 0 - 0 -  I have felt scared or panicky for no good reason. 0 - 0 -  Things have been getting on top of me. 0 - 0 -  I have been so unhappy that I have had difficulty sleeping. 0 - 0 -  I have felt sad or miserable. 0 - 1 -  I have been so unhappy that I have been crying. 0 - 0 -  The thought of harming myself has occurred to me. 0 - 0 -  Edinburgh Postnatal Depression Scale Total 0 - 1 -

## 2021-09-15 ENCOUNTER — Ambulatory Visit: Payer: Self-pay

## 2021-09-15 NOTE — Lactation Note (Signed)
This note was copied from a baby's chart. Lactation Consultation Note  Patient Name: Amanda Russell SELTR'V Date: 09/15/2021 Reason for consult: Follow-up assessment;1st time breastfeeding;NICU baby;Late-preterm 34-36.6wks;MD order;Infant < 6lbs (NP Thayer Ohm asked for [facility]) Age:24 days  Visited with mom of 31 days old LPI NICU female, per baby's provider she was concerned about her supply. Mom voiced that since [facility] visit yesterday, she's been pumping every 3 hours, but when [facility] asked how many times she pumped today, she voiced "only once" and once last night. Explained to mom the importance of consistent pumping for the onset of lactogenesis II/III and to protect her supply and prevent engorgement, she voiced understanding.  LC resized her flanges to # 27 (24 were too tight per mom) and also advised to pump with the flanges sideways. Reviewed pumping schedule and expectations. Mom picked up her WIC pumped yesterday and now has a pump for home use.  Maternal Data  Mom's supply is BNL probably due to decreased pumping  Feeding Mother's Current Feeding Choice: Breast Milk  Lactation Tools Discussed/Used Tools: Pump;Flanges Flange Size: 27 Breast pump type: Double-Electric Breast Pump Pump Education: Setup, frequency, and cleaning;Milk Storage Reason for Pumping: LPI in NICU Pumping frequency: 2 times/24 hours Pumped volume: 40 mL  Interventions Interventions: Breast feeding basics reviewed;DEBP;Education  Plan of care  Encouraged mom to start pumping consistently every 3 hours, ideally 8 pumping sessions/24 hours She'll use ice packs as needed in case she gets engorged. No s/s of engorgement so far  No other support person at this time. All questions and concerns answered, mom to call NICU LC PRN.  Discharge Discharge Education: Engorgement and breast care Pump: DEBP;Personal (WIC pump)  Consult Status Consult Status: Follow-up Date: 09/15/21 Follow-up type: In-patient   Jami Bogdanski  Venetia Constable 09/15/2021, 1:31 PM

## 2021-09-19 ENCOUNTER — Ambulatory Visit: Payer: Self-pay

## 2021-09-19 NOTE — Lactation Note (Signed)
This note was copied from a baby's chart.  NICU Lactation Consultation Note  Patient Name: Amanda Russell VAPOL'I Date: 09/19/2021 Age:25 years   Subjective Reason for consult: Weekly NICU follow-up Mother complains of breast fullness and pain. She has discontinued pumping in past 24-hours. She is concerned about pending placement of infant p d/c and confused about whether to continue pumping. We reviewed the need to remove milk to decrease discomfort and risk of mastitis. We reviewed amount of milk to remove if goal is to dry milk vs to maintain supply.   LC offered ice packs but mother declined.   Objective Infant data: Mother's Current Feeding Choice: Breast Milk and Donor Milk  Infant feeding assessment Scale for Readiness: 4  Maternal data: [id number]  Vaginal, Spontaneous Pumping frequency: sporatic in past 24 hours  Risk factor for low milk supply:: infrequent pumping/ engorgement at day 10   WIC Program: Yes WIC Referral Sent?: Yes Pump: DEBP, Personal (WIC pump)  Assessment  Maternal: Maternal engorgement on day 10 because of abrupt discontinuance of pumping. Mother is at risk of mastitis.   Intervention/Plan Interventions: Education (engorgement) Plan: Consult Status: Follow-up  NICU Follow-up type: Verify absence of engorgement  Mother to remove at least enough milk for her own breast comfort.   Elder Negus 09/19/2021, 11:49 AM

## 2021-09-22 ENCOUNTER — Telehealth (HOSPITAL_COMMUNITY): Payer: Self-pay | Admitting: *Deleted

## 2021-09-22 NOTE — Telephone Encounter (Signed)
Attempted Hospital Discharge Follow-Up Call.  Left voice mail requesting that patient return RN's phone call.  

## 2021-09-25 ENCOUNTER — Inpatient Hospital Stay (HOSPITAL_COMMUNITY)
Admission: AD | Admit: 2021-09-25 | Discharge: 2021-09-25 | Discharge status: Against Medical Advice | Payer: Medicaid Other | Attending: Family Medicine | Admitting: Family Medicine

## 2021-09-25 ENCOUNTER — Ambulatory Visit: Payer: Self-pay

## 2021-09-25 ENCOUNTER — Other Ambulatory Visit: Payer: Self-pay

## 2021-09-25 NOTE — Progress Notes (Signed)
Pt left prior to being triaged. Per Registration,  pt stated she was leaving because her baby was more important and she going outside to wait for her ride.

## 2021-09-25 NOTE — Lactation Note (Signed)
This note was copied from a baby's chart. Lactation Consultation Note Per A. Boyd-Gilyard, LCSW: Mother is not allowed to bring pumped milk to NICU. LC services are complete at this time.  Patient Name: Amanda Russell [id number] Date: 09/25/2021   Age:24 wk.o.  Feeding Nipple Type: Dr. Levert Feinstein Preemie    Elder Negus 09/25/2021, 10:55 AM

## 2021-09-28 ENCOUNTER — Other Ambulatory Visit: Payer: Self-pay

## 2021-09-28 ENCOUNTER — Ambulatory Visit: Payer: Medicaid Other | Admitting: Obstetrics & Gynecology

## 2021-09-28 ENCOUNTER — Ambulatory Visit: Payer: Medicaid Other | Admitting: Clinical

## 2021-09-28 DIAGNOSIS — Z658 Other specified problems related to psychosocial circumstances: Secondary | ICD-10-CM

## 2021-09-28 DIAGNOSIS — Z7189 Other specified counseling: Secondary | ICD-10-CM

## 2021-09-28 NOTE — Patient Instructions (Addendum)
Center for [facility] Women [address] [place], [place] [address] [phone] (main office) [phone] (Kayveon Lennartz's office)  www.postpartum.net (new mom support groups online)  Estate manager/land agent  (Childcare options, Early childcare development, etc.) DietDisorder.cz  LIEAP (Low Income Energy Assistance Program) LowBlog.nl  [employer] (Low Income Automotive engineer) LittleDVDs.dk                                                                      Behavioral Health Resources:   What if I or someone I know is in crisis?  If you are thinking about harming yourself or having thoughts of suicide, or if you know someone who is, seek help right away.  Call your doctor or mental health care provider.  Call 911 or go to a hospital emergency room to get immediate help, or ask a friend or family member to help you do these things; IF YOU ARE IN [place], YOU MAY GO TO WALK-IN URGENT CARE 24/7 at [facility] (see below)  Call the [place] National Suicide Prevention Lifelines toll-free, 24-hour hotline at 1-800-273-TALK 669-337-8904) or TTY: 1-800-799-4 TTY (316)490-7254) to talk to a trained counselor.  If you are in crisis, make sure you are not left alone.   If someone else is in crisis, make sure he or she is not left alone   24 Hour :   [facility]  [address], [place], [place] [address] [phone] or [phone] WALK-IN URGENT CARE 24/7  Therapeutic Alternative Mobile Crisis: (843) [phone]  [place] National Suicide Hotline: (989)025-1677  Family Service of the [employer] (Domestic Violence, Rape & Victim Assistance)  [phone]  [facility]  [address], [place]  [address]   [phone] or [phone]   [facility] [employer] Crisis Services: [phone] (8am-4pm) or [phone] (after hours)        [facility], [address] [place], [place]  [phone] Fax: [phone] [email or web address] *Interpreters available *Accepts all insurance and uninsured for Urgent Care needs *Accepts Medicaid and uninsured for outpatient treatment      Transportation Resources [place] [employer] (GTA) [address] [place], [place], [place] [address] https://www.[place]-.gov/departments/transportation/gdot-divisions/[place]-transit-agency-public-transportation-division     Fixed-route bus services, including regional fare cards for PART, [place], [place], and WSTA buses.  Reduced fare bus ID's available for Medicaid, Medicare, and "orange card" recipients.  SCAT offers curb-to-curb and door-to-door bus services for people with disabilities who are unable to use a fixed-bus route; also offers a shared-ride program.   Helpful tips:  -Routes available online and physical maps available at the main bus hub lobby (each for a specific route) -Smartphone directions often include bus routes (see the "bus" icon, next to the "car" and "walk" icons) -Routes differ on weekends, evenings and holidays, so plan ahead!  -If you have Medicaid, Medicare, or orange card, plan to obtain a reduced-fare ID to save 50% on rides. Check days and times to obtain an ID, and bring all necessary documents.   [employer] [place]) [address] [place], [place]. [address], [place], [place] [address] [phone] SpotApps.nl **Fixed-bus route services,  and demand response bus service for older adults  Department of Social [facility] [address] [place], [place] [address] [phone] www.MysterySinger.com.cy **Medicaid transportation is available to [facility] recipients who need assistance getting to [facility] medical appointments and providers  [facility] [address] [place] [address], [place], [place] [address] www.cjmedicaltransportation.com  ** Offers non-emergency transportation for medical appointments  [employer] [address] [place], [place] [address] [phone] www.wheels4hope.org **REFERRAL NEEDED by specific agencies (see website), after meeting specified criteria only  [employer] for [employer]) [address] [place], [place] [address] [phone]  BuyingShow.es  *Regional fixed-bus routes between counties (example: [place] to [place]) and [employer] of [place]  [address], [place], [place] [address] [phone] Http://senior-resources-[place].org  Museum/gallery exhibitions officer available (call or see website for details)  Senior Adults Association[facility] [facility] [address] [place], [place] [address] [phone] www.senioradults.org  **Ride coordination

## 2021-10-10 NOTE — Progress Notes (Signed)
Post Partum Visit Note  Amanda Russell is a 26 y.o. [phone] female who presents for a postpartum visit. She is 4 weeks postpartum following a normal spontaneous vaginal delivery.  I have fully reviewed the prenatal and intrapartum course. The delivery was at 33.4 gestational weeks.  Anesthesia: none. Postpartum course has been uneventful. Baby is doing well. Baby is feeding by bottle - Similac Neosure. Bleeding no bleeding. Bowel function is normal. Bladder function is normal. Patient is sexually active. Contraception method is Nexplanon. Postpartum depression screening: negative.   The pregnancy intention screening data noted above was reviewed. Potential methods of contraception were discussed. The patient elected to proceed with No data recorded.   Edinburgh Postnatal Depression Scale - 10/11/21 1125       Edinburgh Postnatal Depression Scale:  In the Past 7 Days   I have been able to laugh and see the funny side of things. 0    I have looked forward with enjoyment to things. 0    I have blamed myself unnecessarily when things went wrong. 0    I have been anxious or worried for no good reason. 0    I have felt scared or panicky for no good reason. 0    Things have been getting on top of me. 0    I have been so unhappy that I have had difficulty sleeping. 0    I have felt sad or miserable. 0    I have been so unhappy that I have been crying. 0    The thought of harming myself has occurred to me. 0    Edinburgh Postnatal Depression Scale Total 0             Health Maintenance Due  Topic Date Due   COVID-19 Vaccine (1) Never done   HPV VACCINES (1 - 2-dose series) Never done   PAP-Cervical Cytology Screening  Never done   PAP SMEAR-Modifier  11/15/2018   INFLUENZA VACCINE  05/01/2021    The following portions of the patient's history were reviewed and updated as appropriate: allergies, current medications, past family history, past medical history, past social history,  past surgical history, and problem list.  Review of Systems Pertinent items noted in HPI and remainder of comprehensive ROS otherwise negative.  Objective:  BP 134/83    Pulse 68    Ht 5\' 5"  (1.651 m)    Wt 143 lb 14.4 oz (65.3 kg)    LMP  (LMP Unknown)    Breastfeeding No    BMI 23.95 kg/m    General:  alert, cooperative, and appears stated age   Breasts:  not indicated  Lungs: Comfortable on room air  GU exam:  normal       Assessment:    There are no diagnoses linked to this encounter.  Normal postpartum exam.   Plan:   Essential components of care per ACOG recommendations:  1.  Mood and well being: Patient with negative depression screening today. Reviewed local resources for support.  - Patient tobacco use? No.   - hx of drug use? No.    2. Infant care and feeding:  -Patient currently breastmilk feeding? No.  -Social determinants of health (SDOH) reviewed in EPIC. No concerns  3. Sexuality, contraception and birth spacing - Patient does not want a pregnancy in the next year.  Desired family size is 4 children.  - Reviewed forms of contraception in tiered fashion. Patient desired  Nexplanon  today, see separate  procedure note.   - Discussed birth spacing of 18 months  4. Sleep and fatigue -Encouraged family/partner/community support of 4 hrs of uninterrupted sleep to help with mood and fatigue  5. Physical Recovery  - Discussed patients delivery and complications. She describes her labor as good. - Patient had a Vaginal problems after delivery including precipitous vaginal delivery in MAU . Patient had a no laceration.  - Patient has urinary incontinence? No. - Patient is safe to resume physical and sexual activity  6.  Health Maintenance - HM due items addressed Yes - Last pap smear No results found for: DIAGPAP Pap smear done at today's visit.  -Breast Cancer screening indicated? No.   7. Chronic Disease/Pregnancy Condition follow up: None  - PCP follow  up  Venora Maples, MD Center for [facility], [facility]

## 2021-10-11 ENCOUNTER — Ambulatory Visit (INDEPENDENT_AMBULATORY_CARE_PROVIDER_SITE_OTHER): Payer: Medicaid Other | Admitting: Family Medicine

## 2021-10-11 ENCOUNTER — Other Ambulatory Visit: Payer: Self-pay

## 2021-10-11 ENCOUNTER — Other Ambulatory Visit (HOSPITAL_COMMUNITY)
Admission: RE | Admit: 2021-10-11 | Discharge: 2021-10-11 | Disposition: A | Discharge status: Home / Self Care | Payer: Medicaid Other | Source: Ambulatory Visit | Attending: Family Medicine | Admitting: Family Medicine

## 2021-10-11 ENCOUNTER — Encounter: Payer: Self-pay | Admitting: Family Medicine

## 2021-10-11 DIAGNOSIS — Z124 Encounter for screening for malignant neoplasm of cervix: Secondary | ICD-10-CM | POA: Insufficient documentation

## 2021-10-11 DIAGNOSIS — Z30017 Encounter for initial prescription of implantable subdermal contraceptive: Secondary | ICD-10-CM

## 2021-10-11 DIAGNOSIS — F32A Depression, unspecified: Secondary | ICD-10-CM

## 2021-10-11 DIAGNOSIS — A599 Trichomoniasis, unspecified: Secondary | ICD-10-CM

## 2021-10-11 MED ORDER — ETONOGESTREL 68 MG ~~LOC~~ IMPL
68.0000 mg | DRUG_IMPLANT | Freq: Once | SUBCUTANEOUS | Status: AC
  Administered 2021-10-11: 68 mg via SUBCUTANEOUS

## 2021-10-11 NOTE — Progress Notes (Signed)
° ° ° °  GYNECOLOGY OFFICE PROCEDURE NOTE  Amanda Russell is a 26 y.o. [phone] here for Nexplanon insertion.  Last pap smear was on today.  Nexplanon insertion Procedure Patient identified, informed consent performed, consent signed.   Patient does understand that irregular bleeding is a very common side effect of this medication. She was advised to have backup contraception for one week after placement. Pregnancy test in clinic today was negative.  Appropriate time out taken.  Patient's right arm was prepped and draped in the usual sterile fashion. The ruler used to measure and mark insertion area.  Patient was prepped with alcohol swab and then injected with 3 ml of 1% lidocaine.  She was prepped with betadine, Nexplanon removed from packaging,  Device confirmed in needle, then inserted full length of needle and withdrawn per handbook instructions. Nexplanon was able to palpated in the patient's arm; patient palpated the insert herself. There was minimal blood loss.  Patient insertion site covered with guaze and a pressure bandage to reduce any bruising.  The patient tolerated the procedure well and was given post procedure instructions.  Venora Maples, MD/[facility], [facility] for [employer], [facility]

## 2021-10-12 LAB — POCT PREGNANCY, URINE: Preg Test, Ur: NEGATIVE

## 2021-10-12 LAB — CYTOLOGY - PAP
Chlamydia: NEGATIVE
Comment: NEGATIVE
Comment: NEGATIVE
Comment: NORMAL
Diagnosis: NEGATIVE
Neisseria Gonorrhea: NEGATIVE
Trichomonas: POSITIVE — AB

## 2021-10-12 MED ORDER — METRONIDAZOLE 500 MG PO TABS
500.0000 mg | ORAL_TABLET | Freq: Two times a day (BID) | ORAL | 0 refills | Status: AC
Start: 2021-10-12 — End: 2021-10-19

## 2021-10-12 NOTE — Addendum Note (Signed)
Addended by: Merian Capron on: 10/12/2021 03:29 PM   Modules accepted: Orders

## 2021-10-26 ENCOUNTER — Other Ambulatory Visit: Payer: Self-pay

## 2021-10-26 MED ORDER — METRONIDAZOLE 500 MG PO TABS: 500.0000 mg | ORAL_TABLET | Freq: Two times a day (BID) | ORAL | 0 refills | Status: DC

## 2021-10-26 NOTE — Telephone Encounter (Signed)
Pt came to office stating that she was advised by Crissie Reese, MD that her partner could get treatment as well.  Flagyl 500 mg po bid x 7 days e-prescribed per Shawnie Pons, MD.    Leonette Nutting  10/26/21

## 2021-12-19 ENCOUNTER — Other Ambulatory Visit (HOSPITAL_COMMUNITY)
Admission: RE | Admit: 2021-12-19 | Discharge: 2021-12-19 | Disposition: A | Discharge status: Home / Self Care | Payer: Medicaid Other | Source: Ambulatory Visit | Attending: Family Medicine | Admitting: Family Medicine

## 2021-12-19 ENCOUNTER — Other Ambulatory Visit: Payer: Self-pay

## 2021-12-19 ENCOUNTER — Ambulatory Visit (INDEPENDENT_AMBULATORY_CARE_PROVIDER_SITE_OTHER): Payer: Medicaid Other | Admitting: General Practice

## 2021-12-19 VITALS — BP 123/72 | HR 80 | Ht 65.0 in | Wt 151.0 lb

## 2021-12-19 DIAGNOSIS — N898 Other specified noninflammatory disorders of vagina: Secondary | ICD-10-CM

## 2021-12-19 DIAGNOSIS — Z113 Encounter for screening for infections with a predominantly sexual mode of transmission: Secondary | ICD-10-CM

## 2021-12-19 NOTE — Progress Notes (Signed)
Patient presents to office today requesting self swab due to recent onset of vaginal itching and feeling uncomfortable since having oral sex with a partner. She reports completing medication in January for + trichomonas infection but would like full STD testing today. Blood work completed. Instructed patient in self swab & specimen collected. Advised we will contact you with any abnormal results and needed medication will be sent to pharmacy. Patient verbalized understanding. ? ?Chase Caller RN BSN ?12/19/21 ? ?

## 2021-12-20 ENCOUNTER — Other Ambulatory Visit: Payer: Self-pay | Admitting: Family Medicine

## 2021-12-20 LAB — HIV ANTIBODY (ROUTINE TESTING W REFLEX): HIV Screen 4th Generation wRfx: NONREACTIVE

## 2021-12-20 LAB — CERVICOVAGINAL ANCILLARY ONLY
Bacterial Vaginitis (gardnerella): POSITIVE — AB
Candida Glabrata: NEGATIVE
Candida Vaginitis: POSITIVE — AB
Chlamydia: NEGATIVE
Comment: NEGATIVE
Comment: NEGATIVE
Comment: NEGATIVE
Comment: NEGATIVE
Comment: NEGATIVE
Comment: NORMAL
Neisseria Gonorrhea: NEGATIVE
Trichomonas: NEGATIVE

## 2021-12-20 LAB — RPR: RPR Ser Ql: NONREACTIVE

## 2021-12-20 LAB — HEPATITIS B SURFACE ANTIGEN: Hepatitis B Surface Ag: NEGATIVE

## 2021-12-20 LAB — HEPATITIS C ANTIBODY: Hep C Virus Ab: NONREACTIVE

## 2021-12-20 MED ORDER — FLUCONAZOLE 150 MG PO TABS: 150.0000 mg | ORAL_TABLET | Freq: Once | ORAL | 0 refills | Status: AC

## 2021-12-20 MED ORDER — METRONIDAZOLE 500 MG PO TABS
500.0000 mg | ORAL_TABLET | Freq: Two times a day (BID) | ORAL | 0 refills | Status: AC
Start: 2021-12-20 — End: 2021-12-27

## 2021-12-31 IMAGING — US US OB COMP LESS 14 WK
1 series · 15 of 28 positions shown · non-contrast
Comparison: None available.

CLINICAL DATA: Initial evaluation for acute vaginal bleeding, early
pregnancy.

EXAM:
OBSTETRIC <14 WK ULTRASOUND
TECHNIQUE: Transabdominal ultrasound was performed for evaluation of the
gestation as well as the maternal uterus and adnexal regions.

[Series 1: us ob comp less 14 wk · 41 acquisitions, 15 frames shown]
[im 1/41]
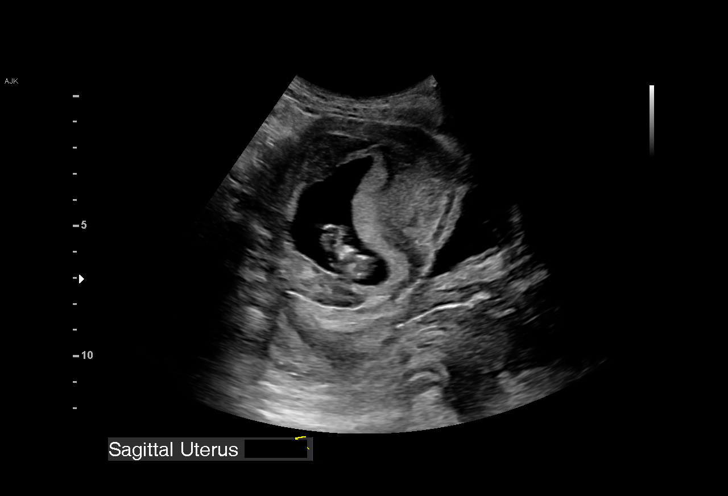
[im 3/41]
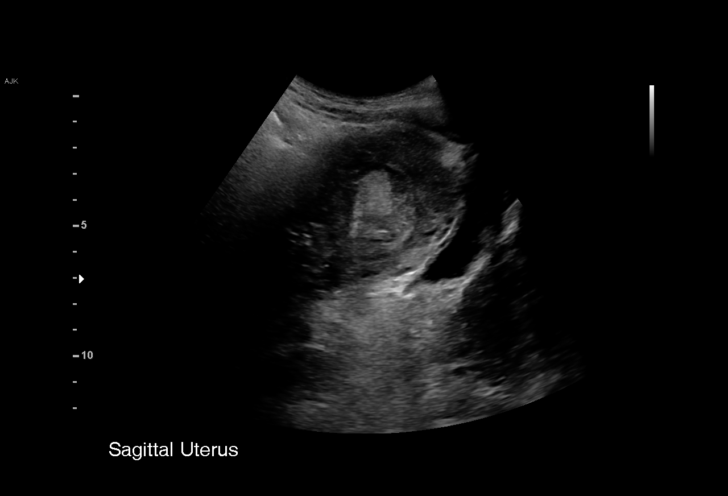
[im 6/41]
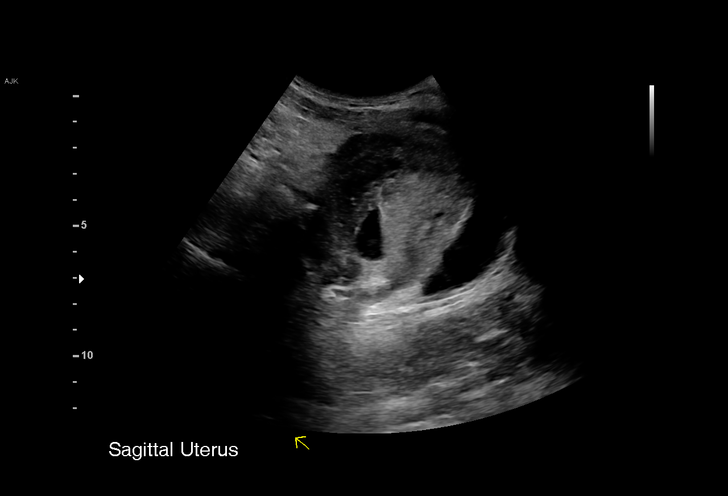
[im 9/41]
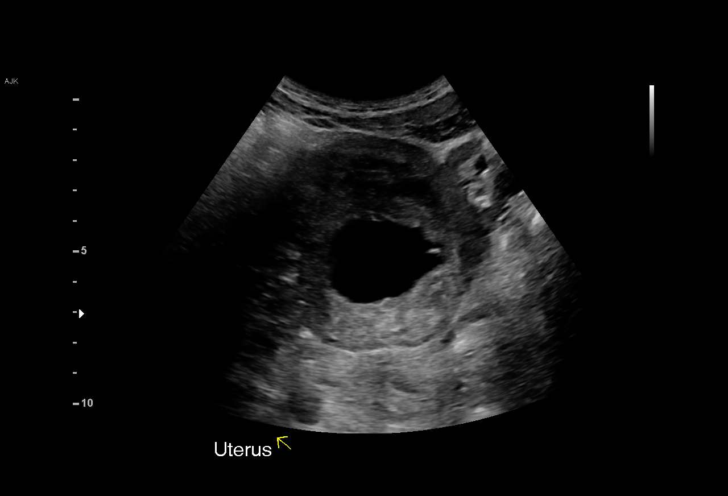
[im 12/41]
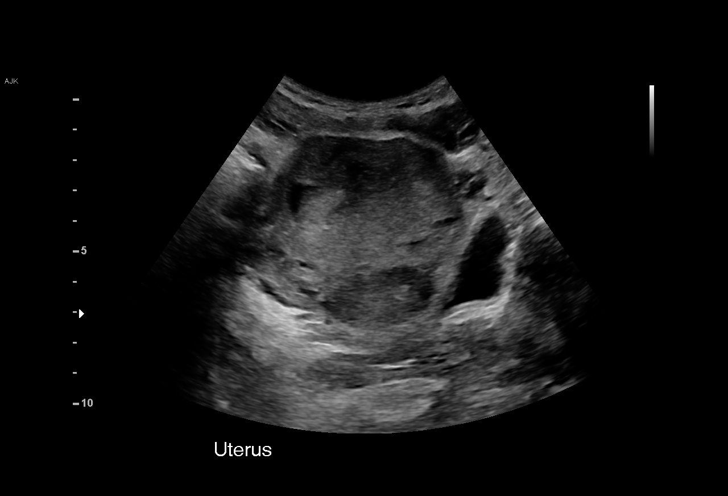
[im 15/41]
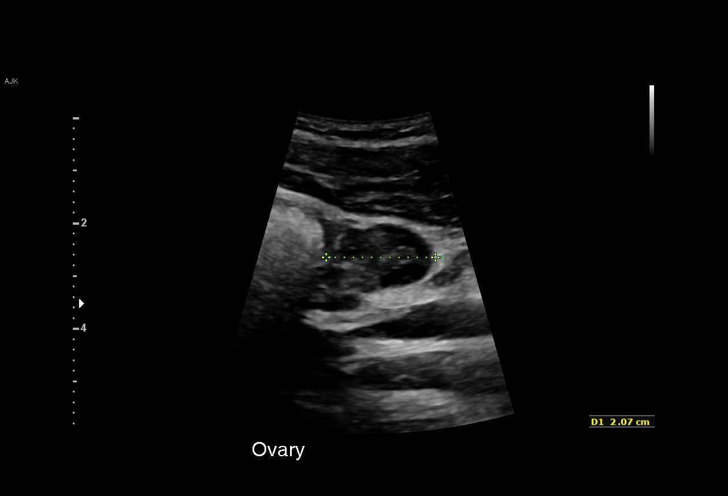
[im 18/41]
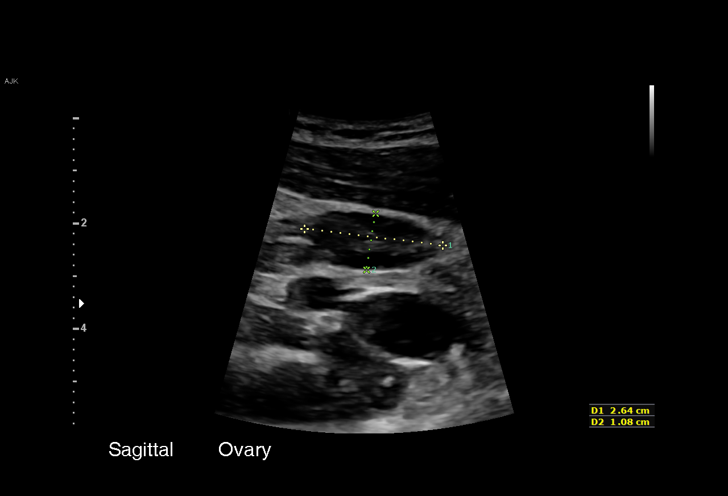
[im 21/41]
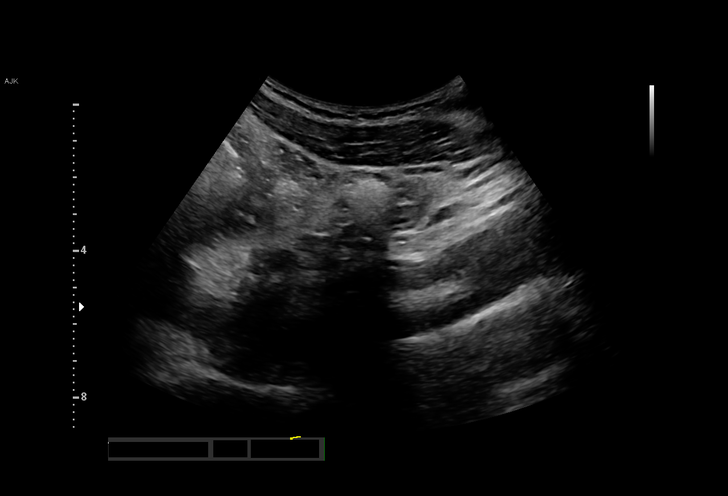
[im 23/41]
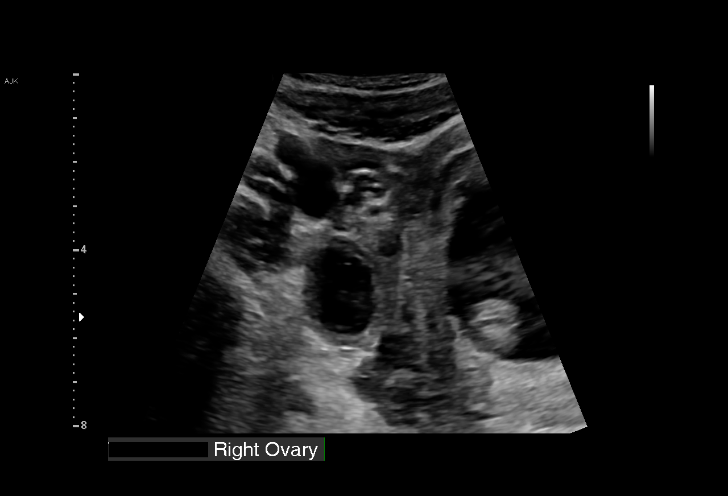
[im 26/41]
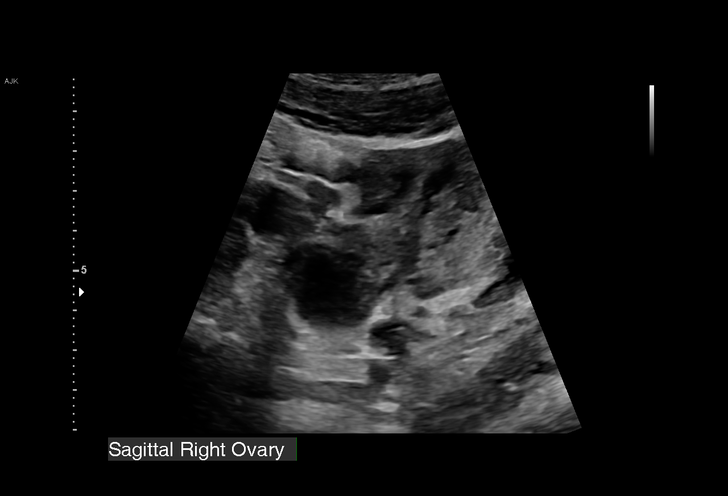
[im 29/41]
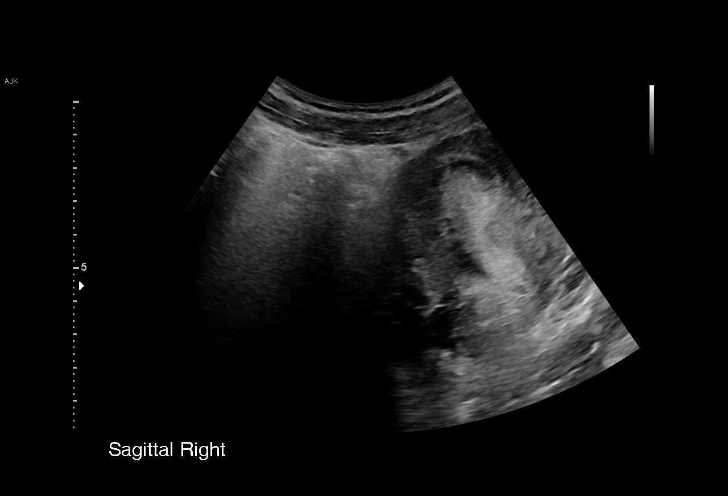
[im 32/41]
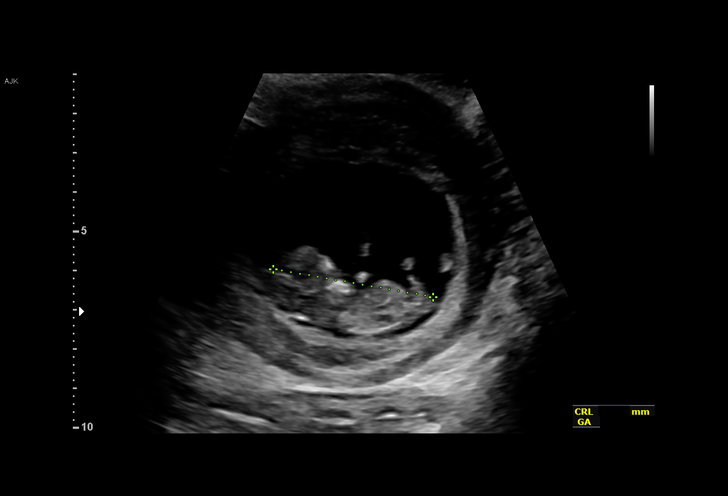
[im 35/41]
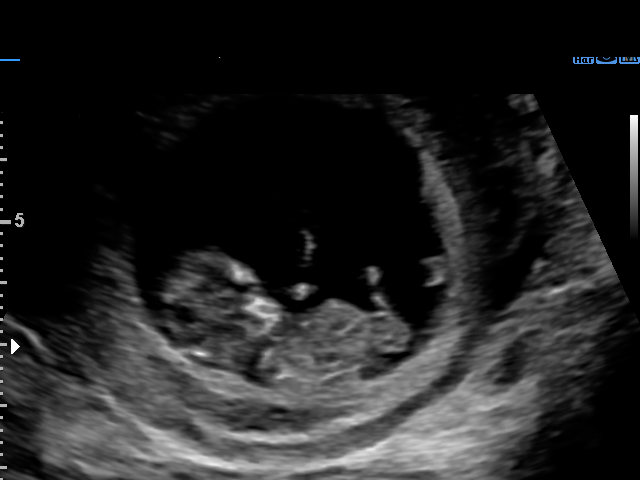
[im 38/41]
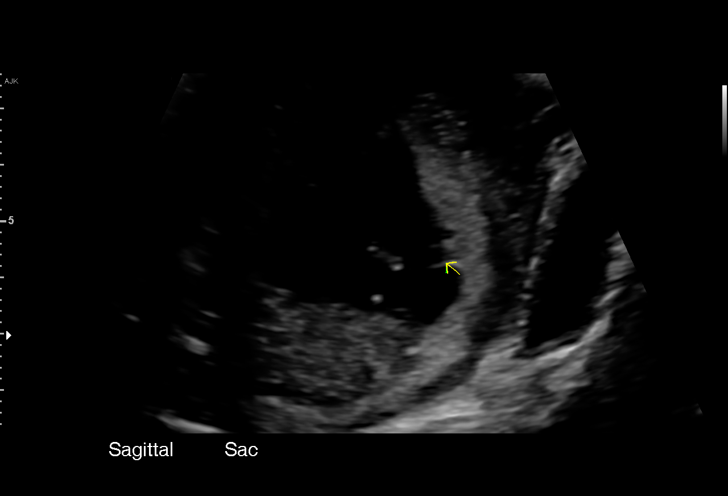
[im 41/41]
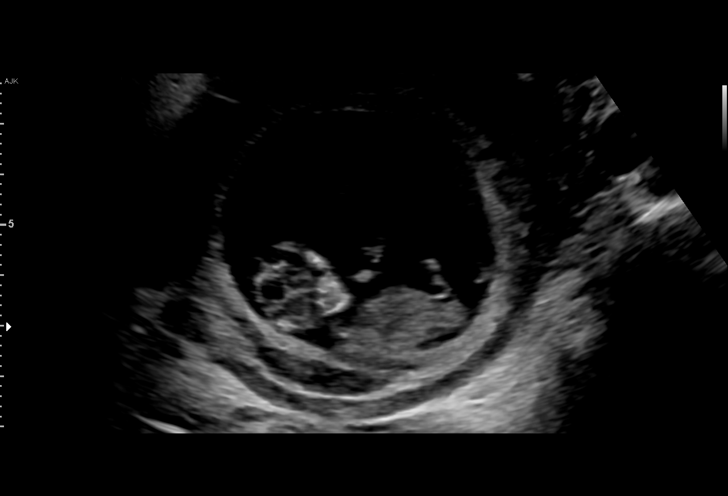

[15 of 28 positions shown; findings below may reference images not displayed]

FINDINGS: Intrauterine gestational sac: Single

Yolk sac:  Present

Embryo:  Present

Cardiac Activity: Present

Heart Rate: 171 bpm

CRL: 40.6 mm   10 w 6 d                  US EDC: 09/09/2020

Subchorionic hemorrhage: Small subchorionic hemorrhage measures
x 2.0 x 1.0 cm. No associated mass effect.

Maternal uterus/adnexae: Ovaries are within normal limits
bilaterally. Small corpus luteal cyst noted on the right. No free
fluid or adnexal mass.
IMPRESSION: 1. Single viable intrauterine pregnancy, estimated gestational age
10 weeks and 6 days by crown-rump length, with ultrasound EDC of
09/09/2020.
2. Small subchorionic hemorrhage as above without associated mass
effect.
3. No other acute maternal uterine or adnexal abnormality.

## 2022-03-28 ENCOUNTER — Encounter (HOSPITAL_COMMUNITY): Payer: Self-pay | Admitting: Emergency Medicine

## 2022-03-28 ENCOUNTER — Ambulatory Visit (HOSPITAL_COMMUNITY)
Admission: EM | Admit: 2022-03-28 | Discharge: 2022-03-28 | Disposition: A | Discharge status: Home / Self Care | Payer: Medicaid Other | Attending: Physician Assistant | Admitting: Physician Assistant

## 2022-03-28 DIAGNOSIS — Z202 Contact with and (suspected) exposure to infections with a predominantly sexual mode of transmission: Secondary | ICD-10-CM

## 2022-03-28 DIAGNOSIS — Z113 Encounter for screening for infections with a predominantly sexual mode of transmission: Secondary | ICD-10-CM | POA: Insufficient documentation

## 2022-03-28 LAB — HIV ANTIBODY (ROUTINE TESTING W REFLEX): HIV Screen 4th Generation wRfx: NONREACTIVE

## 2022-03-28 NOTE — ED Triage Notes (Signed)
Patient c/o vaginal itching x 2 weeks, denies any vaginal discharge, slight odor.  New partner and requesting STI check including bloodwork.

## 2022-03-28 NOTE — Discharge Instructions (Signed)
Will call with test results Abstain from sexually activity until labs results are back and any necessary treatment is completed.

## 2022-03-29 LAB — RPR: RPR Ser Ql: NONREACTIVE

## 2022-03-30 ENCOUNTER — Telehealth (HOSPITAL_COMMUNITY): Payer: Self-pay | Admitting: Emergency Medicine

## 2022-03-30 LAB — CERVICOVAGINAL ANCILLARY ONLY
Bacterial Vaginitis (gardnerella): POSITIVE — AB
Candida Glabrata: NEGATIVE
Candida Vaginitis: NEGATIVE
Chlamydia: NEGATIVE
Comment: NEGATIVE
Comment: NEGATIVE
Comment: NEGATIVE
Comment: NEGATIVE
Comment: NEGATIVE
Comment: NORMAL
Neisseria Gonorrhea: NEGATIVE
Trichomonas: NEGATIVE

## 2022-03-30 MED ORDER — METRONIDAZOLE 500 MG PO TABS: 500.0000 mg | ORAL_TABLET | Freq: Two times a day (BID) | ORAL | 0 refills | Status: DC

## 2022-05-28 NOTE — ED Provider Notes (Signed)
[facility]    CSN: [id number] Arrival date & time: 03/28/22  1725      History   Chief Complaint Chief Complaint  Patient presents with   Vaginal Itching    HPI Amanda Russell is a 26 y.o. female.   Pt complains of two weeks of vaginal itching and slight odor.  She denies rash, vaginal discharge, vaginal bleeding, fever, pelvic pain.  She is requesting STD testing given sx and recent new sexual partner.  She has tried nothing for her sx.     Past Medical History:  Diagnosis Date   ADHD (attention deficit hyperactivity disorder), combined type 07/14/2012   Chlamydia    Preterm premature rupture of membranes (PPROM) with unknown onset of labor 07/13/2014   Suicidal ideation     Patient Active Problem List   Diagnosis Date Noted   Pregnant 09/09/2021   Preterm premature rupture of membranes 09/09/2021   Trichomoniasis 03/10/2020   Chlamydia 10/20/2019   Left tubal pregnancy 10/16/2019   Acute psychosis (HCC) 03/15/2019   Marijuana abuse 08/03/2018   Tobacco dependence 08/03/2018   Bipolar disorder, current episode manic severe with psychotic features (HCC) 08/02/2018   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) 06/10/2017   Suicidal ideation 07/14/2012   Depression 07/14/2012   ADHD (attention deficit hyperactivity disorder), combined type 07/14/2012    Past Surgical History:  Procedure Laterality Date   LAPAROSCOPY Left 10/16/2019   Procedure: LAPAROSCOPIC LEFT SALPINGECTOMY;  Surgeon: [place] Bing, MD;  Location: [facility];  Service: Gynecology;  Laterality: Left;   NO PAST SURGERIES      OB History     Gravida  5   Para  4   Term  2   Preterm  2   AB  1   Living  4      SAB      IAB      Ectopic  1   Multiple  0   Live Births  4            Home Medications    Prior to Admission medications   Medication Sig Start Date End Date Taking? Authorizing Provider  metroNIDAZOLE (FLAGYL) 500 MG tablet Take  1 tablet (500 mg total) by mouth 2 (two) times daily. 03/30/22   LampteyBritta Mccreedy, MD    Family History Family History  Problem Relation Age of Onset   Hypertension Other    Cancer Neg Hx    Diabetes Neg Hx     Social History Social History   Tobacco Use   Smoking status: Former    Packs/day: 0.50    Years: 0.80    Total pack years: 0.40    Types: Cigarettes    Quit date: 01/07/2011    Years since quitting: 11.3   Smokeless tobacco: Never  Vaping Use   Vaping Use: Never used  Substance Use Topics   Alcohol use: Not Currently    Comment: socially   Drug use: Not Currently    Frequency: 7.0 times per week    Types: Marijuana    Comment: denies pt states it has been years 02/18/20     Allergies   Pollen extract   Review of Systems Review of Systems  Constitutional:  Negative for chills and fever.  HENT:  Negative for ear pain and sore throat.   Eyes:  Negative for pain and visual disturbance.  Respiratory:  Negative for cough and shortness of breath.   Cardiovascular:  Negative for chest pain and palpitations.  Gastrointestinal:  Negative for abdominal pain and vomiting.  Genitourinary:  Negative for dysuria and hematuria.       Vaginal itching  Musculoskeletal:  Negative for arthralgias and back pain.  Skin:  Negative for color change and rash.  Neurological:  Negative for seizures and syncope.  All other systems reviewed and are negative.    Physical Exam Triage Vital Signs ED Triage Vitals  Enc Vitals Group     BP 03/28/22 1824 113/75     Pulse Rate 03/28/22 1824 79     Resp 03/28/22 1824 18     Temp 03/28/22 1824 98.6 F (37 C)     Temp Source 03/28/22 1824 Oral     SpO2 03/28/22 1824 98 %     Weight 03/28/22 1825 173 lb (78.5 kg)     Height 03/28/22 1825 5\' 5"  (1.651 m)     Head Circumference --      Peak Flow --      Pain Score 03/28/22 1825 0     Pain Loc --      Pain Edu? --      Excl. in GC? --    No data found.  Updated Vital Signs BP  113/75 (BP Location: Right Arm)   Pulse 79   Temp 98.6 F (37 C) (Oral)   Resp 18   Ht 5\' 5"  (1.651 m)   Wt 173 lb (78.5 kg)   SpO2 98%   Breastfeeding No   BMI 28.79 kg/m   Visual Acuity Right Eye Distance:   Left Eye Distance:   Bilateral Distance:    Right Eye Near:   Left Eye Near:    Bilateral Near:     Physical Exam Vitals and nursing note reviewed.  Constitutional:      General: She is not in acute distress.    Appearance: She is well-developed.  HENT:     Head: Normocephalic and atraumatic.  Eyes:     Conjunctiva/sclera: Conjunctivae normal.  Cardiovascular:     Rate and Rhythm: Normal rate and regular rhythm.     Heart sounds: No murmur heard. Pulmonary:     Effort: Pulmonary effort is normal. No respiratory distress.     Breath sounds: Normal breath sounds.  Abdominal:     Palpations: Abdomen is soft.     Tenderness: There is no abdominal tenderness.  Musculoskeletal:        General: No swelling.     Cervical back: Neck supple.  Skin:    General: Skin is warm and dry.     Capillary Refill: Capillary refill takes less than 2 seconds.  Neurological:     Mental Status: She is alert.  Psychiatric:        Mood and Affect: Mood normal.      UC Treatments / Results  Labs (all labs ordered are listed, but only abnormal results are displayed) Labs Reviewed  CERVICOVAGINAL ANCILLARY ONLY - Abnormal; Notable for the following components:      Result Value   Bacterial Vaginitis (gardnerella) Positive (*)    All other components within normal limits  HIV ANTIBODY (ROUTINE TESTING W REFLEX)  RPR    EKG   Radiology No results found.  Procedures Procedures (including critical care time)  Medications Ordered in UC Medications - No data to display  Initial Impression / Assessment and Plan / UC Course  I have reviewed the triage vital signs and the nursing notes.  Pertinent labs & imaging results that were available during my care of the patient  were reviewed by me and considered in my medical decision making (see chart for details).     STD screening, cervicovaginal self swab in clinic today.  Will treat if indicated based on test results.  Discussed abstinence until test results are back and any necessary treatment has been completed.  Final Clinical Impressions(s) / UC Diagnoses   Final diagnoses:  Screen for STD (sexually transmitted disease)     Discharge Instructions      Will call with test results Abstain from sexually activity until labs results are back and any necessary treatment is completed.    ED Prescriptions   None    PDMP not reviewed this encounter.   Ward, Tylene Fantasia, PA-C 05/28/22 1720

## 2022-08-16 ENCOUNTER — Ambulatory Visit: Payer: Medicaid Other | Admitting: Obstetrics and Gynecology

## 2022-08-16 NOTE — Progress Notes (Deleted)
    GYNECOLOGY VISIT  Patient name: Amanda Russell MRN [id number]  Date of birth: [birth year] Chief Complaint:   No chief complaint on file.   History:  Amanda Russell is a 26 y.o. [phone] being seen today for ***.    Past Medical History:  Diagnosis Date   ADHD (attention deficit hyperactivity disorder), combined type 07/14/2012   Chlamydia    Preterm premature rupture of membranes (PPROM) with unknown onset of labor 07/13/2014   Suicidal ideation     Past Surgical History:  Procedure Laterality Date   LAPAROSCOPY Left 10/16/2019   Procedure: LAPAROSCOPIC LEFT SALPINGECTOMY;  Surgeon: [place] Bing, MD;  Location: [facility];  Service: Gynecology;  Laterality: Left;   NO PAST SURGERIES      The following portions of the patient's history were reviewed and updated as appropriate: allergies, current medications, past family history, past medical history, past social history, past surgical history and problem list.      Review of Systems:  {Ros - complete:30496} Comprehensive review of systems was otherwise negative.   Objective:  Physical Exam There were no vitals taken for this visit.   Physical Exam  Nexplanon Removal  Patient identified, informed consent performed, consent signed.   Patient does understand that irregular bleeding is a very common side effect of this medication. She was advised to have backup contraception for one week after replacement of the implant. Pregnancy test in clinic today was negative.  Appropriate time out taken. Nexplanon site identified in left arm.  Area prepped in usual sterile fashon. One ml of 1% lidocaine was used to anesthetize the area at the distal end of the implant. A small stab incision was made right beside the implant on the distal portion. The Nexplanon rod was grasped using hemostats and removed without difficulty. There was minimal blood loss. There were no complications. She was advised to have backup contraception for one  w   Labs and Imaging No results found.     Assessment & Plan:   There are no diagnoses linked to this encounter.   *** Routine preventative health maintenance measures emphasized.  Lorriane Shire, MD [facility] for [facility], [facility]

## 2022-09-27 ENCOUNTER — Ambulatory Visit
Admission: EM | Admit: 2022-09-27 | Discharge: 2022-09-27 | Disposition: A | Discharge status: Home / Self Care | Payer: Medicaid Other | Attending: Family Medicine | Admitting: Family Medicine

## 2022-09-27 ENCOUNTER — Other Ambulatory Visit: Payer: Self-pay

## 2022-09-27 ENCOUNTER — Encounter: Payer: Self-pay | Admitting: Emergency Medicine

## 2022-09-27 DIAGNOSIS — R102 Pelvic and perineal pain: Secondary | ICD-10-CM | POA: Insufficient documentation

## 2022-09-27 DIAGNOSIS — Z113 Encounter for screening for infections with a predominantly sexual mode of transmission: Secondary | ICD-10-CM | POA: Diagnosis not present

## 2022-09-27 DIAGNOSIS — Z3202 Encounter for pregnancy test, result negative: Secondary | ICD-10-CM

## 2022-09-27 LAB — POCT URINE PREGNANCY: Preg Test, Ur: NEGATIVE

## 2022-09-27 NOTE — ED Triage Notes (Signed)
Pt here for vaginal discharge x 3 days

## 2022-09-27 NOTE — Discharge Instructions (Addendum)
  The pregnancy test was negative  Staff will notify you of any positive test on your swab or your blood work.

## 2022-09-27 NOTE — ED Provider Notes (Signed)
[name]    CSN: [id number] Arrival date & time: 09/27/22  1909      History   Chief Complaint Chief Complaint  Patient presents with   Exposure to STD    HPI Amanda Russell is a 26 y.o. female.    Exposure to STD   Here for vaginal itching and irritation and some lower abdominal pain that is been going on for about a month.  Last menstrual cycle was about 1 month ago.   No fever and no vomiting.  Past Medical History:  Diagnosis Date   ADHD (attention deficit hyperactivity disorder), combined type 07/14/2012   Chlamydia    Preterm premature rupture of membranes (PPROM) with unknown onset of labor 07/13/2014   Suicidal ideation     Patient Active Problem List   Diagnosis Date Noted   Pregnant 09/09/2021   Preterm premature rupture of membranes 09/09/2021   Trichomoniasis 03/10/2020   Chlamydia 10/20/2019   Left tubal pregnancy 10/16/2019   Acute psychosis (HCC) 03/15/2019   Marijuana abuse 08/03/2018   Tobacco dependence 08/03/2018   Bipolar disorder, current episode manic severe with psychotic features (HCC) 08/02/2018   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) 06/10/2017   Suicidal ideation 07/14/2012   Depression 07/14/2012   ADHD (attention deficit hyperactivity disorder), combined type 07/14/2012    Past Surgical History:  Procedure Laterality Date   LAPAROSCOPY Left 10/16/2019   Procedure: LAPAROSCOPIC LEFT SALPINGECTOMY;  Surgeon: [place] Bing, MD;  Location: [facility];  Service: Gynecology;  Laterality: Left;   NO PAST SURGERIES      OB History     Gravida  5   Para  4   Term  2   Preterm  2   AB  1   Living  4      SAB      IAB      Ectopic  1   Multiple  0   Live Births  4            Home Medications    Prior to Admission medications   Not on File    Family History Family History  Problem Relation Age of Onset   Hypertension Other    Cancer Neg Hx    Diabetes Neg Hx      Social History Social History   Tobacco Use   Smoking status: Former    Packs/day: 0.50    Years: 0.80    Total pack years: 0.40    Types: Cigarettes    Quit date: 01/07/2011    Years since quitting: 11.7   Smokeless tobacco: Never  Vaping Use   Vaping Use: Never used  Substance Use Topics   Alcohol use: Not Currently    Comment: socially   Drug use: Not Currently    Frequency: 7.0 times per week    Types: Marijuana    Comment: denies pt states it has been years 02/18/20     Allergies   Pollen extract   Review of Systems Review of Systems   Physical Exam Triage Vital Signs ED Triage Vitals  Enc Vitals Group     BP 09/27/22 1953 119/63     Pulse Rate 09/27/22 1953 66     Resp 09/27/22 1953 14     Temp 09/27/22 1953 98.4 F (36.9 C)     Temp Source 09/27/22 1936 (P) Oral     SpO2 09/27/22 1953 98 %     Weight --  Height --      Head Circumference --      Peak Flow --      Pain Score 09/27/22 1949 0     Pain Loc --      Pain Edu? --      Excl. in GC? --    No data found.  Updated Vital Signs BP 119/63 (BP Location: Left Arm)   Pulse 66   Temp 98.4 F (36.9 C) (Oral)   Resp 14   LMP  (LMP Unknown)   SpO2 98%   Visual Acuity Right Eye Distance:   Left Eye Distance:   Bilateral Distance:    Right Eye Near:   Left Eye Near:    Bilateral Near:     Physical Exam Vitals reviewed.  Constitutional:      General: She is not in acute distress.    Appearance: She is not ill-appearing, toxic-appearing or diaphoretic.  HENT:     Mouth/Throat:     Mouth: Mucous membranes are moist.  Eyes:     Extraocular Movements: Extraocular movements intact.     Pupils: Pupils are equal, round, and reactive to light.  Cardiovascular:     Rate and Rhythm: Normal rate and regular rhythm.     Heart sounds: No murmur heard. Pulmonary:     Effort: Pulmonary effort is normal.     Breath sounds: Normal breath sounds.  Abdominal:     Palpations: Abdomen is  soft.     Tenderness: There is no abdominal tenderness.  Neurological:     General: No focal deficit present.     Mental Status: She is alert and oriented to person, place, and time.  Psychiatric:        Behavior: Behavior normal.      UC Treatments / Results  Labs (all labs ordered are listed, but only abnormal results are displayed) Labs Reviewed  HIV ANTIBODY (ROUTINE TESTING W REFLEX)  RPR  POCT URINE PREGNANCY  CERVICOVAGINAL ANCILLARY ONLY    EKG   Radiology No results found.  Procedures Procedures (including critical care time)  Medications Ordered in UC Medications - No data to display  Initial Impression / Assessment and Plan / UC Course  I have reviewed the triage vital signs and the nursing notes.  Pertinent labs & imaging results that were available during my care of the patient were reviewed by me and considered in my medical decision making (see chart for details).        Self swab is done and HIV and RPR are done at her request.  Staff will notify her of any positives and treat per protocol.  Urine pregnancy test was negative Final Clinical Impressions(s) / UC Diagnoses   Final diagnoses:  Screening examination for STD (sexually transmitted disease)  Pelvic pain     Discharge Instructions       The pregnancy test was negative  Staff will notify you of any positive test on your swab or your blood work.     ED Prescriptions   None    PDMP not reviewed this encounter.   Amanda Resides, MD 09/27/22 2008

## 2022-09-29 LAB — RPR: RPR Ser Ql: NONREACTIVE

## 2022-09-29 LAB — HIV ANTIBODY (ROUTINE TESTING W REFLEX): HIV Screen 4th Generation wRfx: NONREACTIVE

## 2022-10-02 LAB — CERVICOVAGINAL ANCILLARY ONLY
Bacterial Vaginitis (gardnerella): POSITIVE — AB
Candida Glabrata: NEGATIVE
Candida Vaginitis: POSITIVE — AB
Chlamydia: NEGATIVE
Comment: NEGATIVE
Comment: NEGATIVE
Comment: NEGATIVE
Comment: NEGATIVE
Comment: NEGATIVE
Comment: NORMAL
Neisseria Gonorrhea: NEGATIVE
Trichomonas: POSITIVE — AB

## 2022-10-03 ENCOUNTER — Telehealth (HOSPITAL_COMMUNITY): Payer: Self-pay | Admitting: Emergency Medicine

## 2022-10-03 MED ORDER — FLUCONAZOLE 150 MG PO TABS: 150.0000 mg | ORAL_TABLET | Freq: Once | ORAL | 0 refills | Status: AC

## 2022-10-03 MED ORDER — METRONIDAZOLE 500 MG PO TABS: 500.0000 mg | ORAL_TABLET | Freq: Two times a day (BID) | ORAL | 0 refills | Status: DC

## 2023-01-14 ENCOUNTER — Telehealth: Payer: Medicaid Other | Admitting: Physician Assistant

## 2023-01-14 DIAGNOSIS — B999 Unspecified infectious disease: Secondary | ICD-10-CM

## 2023-01-14 NOTE — Progress Notes (Signed)
Virtual Visit Consent   Amanda Russell, you are scheduled for a virtual visit with a [facility] provider today. Just as with appointments in the office, your consent must be obtained to participate. Your consent will be active for this visit and any virtual visit you may have with one of our providers in the next 365 days. If you have a MyChart account, a copy of this consent can be sent to you electronically.  As this is a virtual visit, video technology does not allow for your provider to perform a traditional examination. This may limit your provider's ability to fully assess your condition. If your provider identifies any concerns that need to be evaluated in person or the need to arrange testing (such as labs, EKG, etc.), we will make arrangements to do so. Although advances in technology are sophisticated, we cannot ensure that it will always work on either your end or our end. If the connection with a video visit is poor, the visit may have to be switched to a telephone visit. With either a video or telephone visit, we are not always able to ensure that we have a secure connection.  By engaging in this virtual visit, you consent to the provision of healthcare and authorize for your insurance to be billed (if applicable) for the services provided during this visit. Depending on your insurance coverage, you may receive a charge related to this service.  I need to obtain your verbal consent now. Are you willing to proceed with your visit today? [name] has provided verbal consent on 01/14/2023 for a virtual visit (video or telephone). Amanda Russell, [place]  Date: 01/14/2023 4:52 PM  Virtual Visit via Video Note   I, Amanda Russell, connected with  [name]  ([id number], 1995-12-06) on 01/14/23 at  5:15 PM EDT by a video-enabled telemedicine application and verified that I am speaking with the correct person using two identifiers.  Location: Patient: Virtual Visit Location  Patient: Home Provider: Virtual Visit Location Provider: Home Office   I discussed the limitations of evaluation and management by telemedicine and the availability of in person appointments. The patient expressed understanding and agreed to proceed.    History of Present Illness: Amanda Russell is a 27 y.o. who identifies as a female who was assigned female at birth, and is being seen today for request of refill of medications given at an in-person urgent care visit in December of last year where she was diagnosed with Trich, BV and Yeast. States symptoms never resolved despite treatment. Notes she has been active with the gentleman she obtained these infections from previously. Also noting itching in her throat and weight loss over past few months due to ongoing throat symptoms. Per EMR review her prior testing was negative for gonorrhea or chlamydia. HIV and syphilis testing negative as well at that time.   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  Resting comfortably in parked car. Head  is normocephalic, atraumatic.  No labored breathing. Speech is clear and coherent with logical content.  Patient is alert and oriented at baseline.   Assessment and Plan: 1. Persistent infection  Concern for persistent infection -- unsure of incomplete treatment of trich versus new infection along with concern for other possible ST/cause of ongoing symptoms. This needs repeat in person evaluation. Discussed refill of medications would not be appropriate 4 months afer evaluation since we do not know what all needs to be treated. She is unhappy that medications will not be refilled without evaluation and notes she "will find someone else that will just send them in". Patient disconnected from video at that point. Unable to reconnect with her.   Follow Up Instructions: I discussed the assessment and treatment plan with the patient. The patient was provided an opportunity to ask questions and all were answered. The patient agreed with the plan and demonstrated an understanding of the instructions.  A copy of instructions were sent to the patient via MyChart unless otherwise noted below.   The patient was advised to call back or seek an in-person evaluation if the symptoms worsen or if the condition fails to improve as anticipated.  Time:  I spent 10 minutes with the patient via telehealth technology discussing the above problems/concerns.    Amanda Climes, PA-C

## 2023-01-14 NOTE — Patient Instructions (Signed)
  Vincente Poli, thank you for joining [name], PA-C for today's virtual visit.  While this provider is not your primary care provider (PCP), if your PCP is located in our provider database this encounter information will be shared with them immediately following your visit.   A [facility] MyChart account gives you access to today's visit and all your visits, tests, and labs performed at [facility] " click here if you don't have a [facility] MyChart account or go to [email or web address]  Consent: (Patient) Amanda Russell provided verbal consent for this virtual visit at the beginning of the encounter.  Current Medications:  Current Outpatient Medications:    metroNIDAZOLE (FLAGYL) 500 MG tablet, Take 1 tablet (500 mg total) by mouth 2 (two) times daily., Disp: 14 tablet, Rfl: 0   Medications ordered in this encounter:  No orders of the defined types were placed in this encounter.    *If you need refills on other medications prior to your next appointment, please contact your pharmacy*  Follow-Up: Call back or seek an in-person evaluation if the symptoms worsen or if the condition fails to improve as anticipated.  [facility] [phone]  Other Instructions Please seek in-person evaluation as discussed, using links below.   If you have been instructed to have an in-person evaluation today at a local Urgent Care facility, please use the link below. It will take you to a list of all of our available [facility], including address, phone number and hours of operation. Please do not delay care.  [facility]  If you or a family member do not have a primary care provider, use the link below to schedule a visit and establish care. When you choose a [facility] primary care physician or advanced practice provider, you gain a long-term partner in health. Find a Primary Care Provider  Learn more about [facility]  in-office and virtual care options:  - Get Care Now

## 2023-01-15 ENCOUNTER — Ambulatory Visit
Admission: EM | Admit: 2023-01-15 | Discharge: 2023-01-15 | Disposition: A | Discharge status: Home / Self Care | Payer: Medicaid Other | Attending: Emergency Medicine | Admitting: Emergency Medicine

## 2023-01-15 DIAGNOSIS — Z113 Encounter for screening for infections with a predominantly sexual mode of transmission: Secondary | ICD-10-CM | POA: Diagnosis present

## 2023-01-15 DIAGNOSIS — J029 Acute pharyngitis, unspecified: Secondary | ICD-10-CM | POA: Insufficient documentation

## 2023-01-15 LAB — POCT URINALYSIS DIP (MANUAL ENTRY)
Bilirubin, UA: NEGATIVE
Blood, UA: NEGATIVE
Glucose, UA: NEGATIVE mg/dL
Leukocytes, UA: NEGATIVE
Nitrite, UA: NEGATIVE
Protein Ur, POC: 100 mg/dL — AB
Spec Grav, UA: >=1.03 — AB (ref 1.010–1.025)
Urobilinogen, UA: 1 E.U./dL
pH, UA: 6.5 (ref 5.0–8.0)

## 2023-01-15 LAB — POCT RAPID STREP A (OFFICE): Rapid Strep A Screen: NEGATIVE

## 2023-01-15 LAB — POCT URINE PREGNANCY: Preg Test, Ur: NEGATIVE

## 2023-01-15 NOTE — ED Provider Notes (Signed)
[name]    CSN: [id number] Arrival date & time: 01/15/23  1712    HISTORY   Chief Complaint  Patient presents with   Sore Throat        std testing   HPI Amanda Russell is a pleasant, 27 y.o. female who presents to urgent care today. Pt reports scratchy, sore throat and intermittent pain with swallowing food x 3 weeks. Pt states she hooka vapes, smokes marijuana. Endorses hx springtime allergies, not currently taking any allergy medications.     Pt requested STD's test. Patient denies abnormal odor of urine, burning with urination, increased frequency of urination, suprapubic pain, perineal pain, flank pain, fever, chills, malaise, rigors, significant fatigue, abnormal vaginal discharge, abnormal vaginal odor, vaginal itching, vaginal irritation, dyspareunia, genital lesion(s), known exposure to STD, and possible exposure to STD.  Pt reports hx trichomonas in the past.   The history is provided by the patient.   Past Medical History:  Diagnosis Date   ADHD (attention deficit hyperactivity disorder), combined type 07/14/2012   Chlamydia    Preterm premature rupture of membranes (PPROM) with unknown onset of labor 07/13/2014   Suicidal ideation    Patient Active Problem List   Diagnosis Date Noted   Pregnant 09/09/2021   Preterm premature rupture of membranes 09/09/2021   Trichomoniasis 03/10/2020   Chlamydia 10/20/2019   Left tubal pregnancy 10/16/2019   Acute psychosis 03/15/2019   Marijuana abuse 08/03/2018   Tobacco dependence 08/03/2018   Bipolar disorder, current episode manic severe with psychotic features 08/02/2018   Cannabis abuse with psychotic disorder with delusions 06/10/2017   Suicidal ideation 07/14/2012   Depression 07/14/2012   ADHD (attention deficit hyperactivity disorder), combined type 07/14/2012   Past Surgical History:  Procedure Laterality Date   LAPAROSCOPY Left 10/16/2019   Procedure: LAPAROSCOPIC LEFT SALPINGECTOMY;   Surgeon: Harvey Bing, MD;  Location: [facility];  Service: Gynecology;  Laterality: Left;   NO PAST SURGERIES     OB History     Gravida  5   Para  4   Term  2   Preterm  2   AB  1   Living  4      SAB      IAB      Ectopic  1   Multiple  0   Live Births  4          Home Medications    Prior to Admission medications   Medication Sig Start Date End Date Taking? Authorizing Provider  etonogestrel (NEXPLANON) 68 MG IMPL implant 1 each by Subdermal route once.   Yes [provider]  metroNIDAZOLE (FLAGYL) 500 MG tablet Take 1 tablet (500 mg total) by mouth 2 (two) times daily. 10/03/22   LampteyBritta Mccreedy, MD    Family History Family History  Problem Relation Age of Onset   Hypertension Other    Cancer Neg Hx    Diabetes Neg Hx    Social History Social History   Tobacco Use   Smoking status: Former    Packs/day: 0.50    Years: 0.80    Additional pack years: 0.00    Total pack years: 0.40    Types: Cigarettes    Quit date: 01/07/2011    Years since quitting: 12.0   Smokeless tobacco: Never  Vaping Use   Vaping Use: Some days  Substance Use Topics   Alcohol use: Not Currently    Comment: socially   Drug use: Yes  Frequency: 4.0 times per week    Types: Marijuana   Allergies   Pollen extract  Review of Systems Review of Systems Pertinent findings revealed after performing a 14 point review of systems has been noted in the history of present illness.  Physical Exam Vital Signs BP 118/79 (BP Location: Left Arm)   Pulse 81   Temp 98.2 F (36.8 C) (Oral)   Resp 16   SpO2 98%   No data found.  Physical Exam Vitals and nursing note reviewed.  Constitutional:      General: She is not in acute distress.    Appearance: Normal appearance. She is not ill-appearing.  HENT:     Head: Normocephalic and atraumatic.     Salivary Glands: Right salivary gland is not diffusely enlarged or tender. Left salivary gland is not diffusely  enlarged or tender.     Right Ear: Ear canal and external ear normal. No drainage. A middle ear effusion is present. There is no impacted cerumen. Tympanic membrane is bulging. Tympanic membrane is not injected or erythematous.     Left Ear: Ear canal and external ear normal. No drainage. A middle ear effusion is present. There is no impacted cerumen. Tympanic membrane is bulging. Tympanic membrane is not injected or erythematous.     Ears:     Comments: Bilateral EACs normal, both TMs bulging with clear fluid    Nose: Rhinorrhea present. No nasal deformity, septal deviation, signs of injury, nasal tenderness, mucosal edema or congestion. Rhinorrhea is clear.     Right Nostril: Occlusion present. No foreign body, epistaxis or septal hematoma.     Left Nostril: Occlusion present. No foreign body, epistaxis or septal hematoma.     Right Turbinates: Enlarged, swollen and pale.     Left Turbinates: Enlarged, swollen and pale.     Right Sinus: No maxillary sinus tenderness or frontal sinus tenderness.     Left Sinus: No maxillary sinus tenderness or frontal sinus tenderness.     Mouth/Throat:     Lips: Pink. No lesions.     Mouth: Mucous membranes are moist. No oral lesions.     Pharynx: Oropharynx is clear. Uvula midline. Posterior oropharyngeal erythema and uvula swelling present. No pharyngeal swelling or oropharyngeal exudate.     Tonsils: No tonsillar exudate. 0 on the right. 0 on the left.     Comments: Postnasal drip, cobblestoning of posterior pharynx Eyes:     General: Lids are normal.        Right eye: No discharge.        Left eye: No discharge.     Extraocular Movements: Extraocular movements intact.     Conjunctiva/sclera: Conjunctivae normal.     Right eye: Right conjunctiva is not injected.     Left eye: Left conjunctiva is not injected.  Neck:     Trachea: Trachea and phonation normal.  Cardiovascular:     Rate and Rhythm: Normal rate and regular rhythm.     Pulses: Normal  pulses.     Heart sounds: Normal heart sounds. No murmur heard.    No friction rub. No gallop.  Pulmonary:     Effort: Pulmonary effort is normal. No accessory muscle usage, prolonged expiration or respiratory distress.     Breath sounds: Normal breath sounds. No stridor, decreased air movement or transmitted upper airway sounds. No decreased breath sounds, wheezing, rhonchi or rales.  Chest:     Chest wall: No tenderness.  Musculoskeletal:  General: Normal range of motion.     Cervical back: Normal range of motion and neck supple. Normal range of motion.  Lymphadenopathy:     Cervical: No cervical adenopathy.  Skin:    General: Skin is warm and dry.     Findings: No erythema or rash.  Neurological:     General: No focal deficit present.     Mental Status: She is alert and oriented to person, place, and time.  Psychiatric:        Mood and Affect: Mood normal.        Behavior: Behavior normal.     Visual Acuity Right Eye Distance:   Left Eye Distance:   Bilateral Distance:    Right Eye Near:   Left Eye Near:    Bilateral Near:     UC Couse / Diagnostics / Procedures:     Radiology No results found.  Procedures Procedures (including critical care time) EKG  Pending results:  Labs Reviewed  POCT URINALYSIS DIP (MANUAL ENTRY) - Abnormal; Notable for the following components:      Result Value   Clarity, UA cloudy (*)    Ketones, POC UA trace (5) (*)    Spec Grav, UA >=1.030 (*)    Protein Ur, POC =100 (*)    All other components within normal limits  CULTURE, GROUP A STREP [facility])  HIV ANTIBODY (ROUTINE TESTING W REFLEX)   Narrative:    Performed at:  8 - [facility] [address], [place], [place]  [id number] Lab Director: Jolene Schimke MD, Phone:  [phone]  RPR   Narrative:    Performed at:  [address] [facility] [address] [place], [place]  [id number] Lab Director: Jolene Schimke MD, Phone:  [phone]  POCT URINE PREGNANCY  POCT  RAPID STREP A (OFFICE)  CERVICOVAGINAL ANCILLARY ONLY    Medications Ordered in UC: Medications - No data to display  UC Diagnoses / Final Clinical Impressions(s)   I have reviewed the triage vital signs and the nursing notes.  Pertinent labs & imaging results that were available during my care of the patient were reviewed by me and considered in my medical decision making (see chart for details).    Final diagnoses:  Acute pharyngitis, unspecified etiology  Screening examination for STD (sexually transmitted disease)  Patient advised of physical exam findings concerning for allergies, Zyrtec and cetirizine recommended.  Rapid strep test today was negative, throat culture pending, will treat with antibiotics as needed based on that result.  STD screening was performed, patient advised that the results be posted to their MyChart and if any of the results are positive, they will be notified by phone, further treatment will be provided as indicated based on results of STD screening. Patient was advised to abstain from sexual intercourse until that they receive the results of their STD testing.  Patient was also advised to use condoms to protect themselves from STD exposure. Urinalysis today was normal. Urine pregnancy test was negative. Return precautions advised.  Drug allergies reviewed, all questions addressed.   Please see discharge instructions below for details of plan of care as provided to patient. ED Prescriptions   None    PDMP not reviewed this encounter.  Disposition Upon Discharge:  Condition: stable for discharge home  Patient presented with concern for an acute illness with associated systemic symptoms and significant discomfort requiring urgent management. In my opinion, this is a condition that a prudent lay person (someone who possesses an average knowledge  of health and medicine) may potentially expect to result in complications if not addressed urgently such as  respiratory distress, impairment of bodily function or dysfunction of bodily organs.   As such, the patient has been evaluated and assessed, work-up was performed and treatment was provided in alignment with urgent care protocols and evidence based medicine.  Patient/parent/caregiver has been advised that the patient may require follow up for further testing and/or treatment if the symptoms continue in spite of treatment, as clinically indicated and appropriate.  Routine symptom specific, illness specific and/or disease specific instructions were discussed with the patient and/or caregiver at length.  Prevention strategies for avoiding STD exposure were also discussed.  The patient will follow up with their current PCP if and as advised. If the patient does not currently have a PCP we will assist them in obtaining one.   The patient may need specialty follow up if the symptoms continue, in spite of conservative treatment and management, for further workup, evaluation, consultation and treatment as clinically indicated and appropriate.  Patient/parent/caregiver verbalized understanding and agreement of plan as discussed.  All questions were addressed during visit.  Please see discharge instructions below for further details of plan.  Discharge Instructions:   Discharge Instructions      Your strep test today is negative.  Streptococcal throat culture will be performed per our protocol.  The result of your throat culture will be posted to your MyChart once it is complete, this typically takes 3 to 5 days.  If your streptococcal throat culture is positive, you will be contacted by phone and antibiotics will prescribed for you.    Your symptoms and my physical exam findings are concerning for exacerbation of your underlying allergies.   Please read below to learn more about the medications, dosages and frequencies that I recommend to help alleviate your symptoms and to get you feeling better soon:    Zyrtec (cetirizine): This is an excellent second-generation antihistamine that helps to reduce respiratory inflammatory response to environmental allergens.  In some patients, this medication can cause daytime sleepiness so I recommend that you take 1 tablet daily at bedtime.     Flonase (fluticasone): This is a steroid nasal spray that used once daily, 1 spray in each nare.  This works best when used on a daily basis. This medication does not work well if it is only used when you think you need it.  After 3 to 5 days of use, you will notice significant reduction of the inflammation and mucus production that is currently being caused by exposure to allergens, whether seasonal or environmental.  The most common side effect of this medication is nosebleeds.  If you experience a nosebleed, please discontinue use for 1 week, then feel free to resume.  If you find that your insurance will not pay for this medication, please consider a different nasal steroids such as Nasonex (mometasone), or Nasacort (triamcinolone).   The results of your vaginal swab test which screens for BV, yeast, gonorrhea, chlamydia and trichomonas will be made posted to your MyChart account once it is complete.  This typically takes 2 to 4 days.  Please abstain from sexual intercourse of any kind, vaginal, oral or anal, until you have received the results of your STD testing.     The results of your HIV and syphilis blood tests will be made available to you once they are complete.  They will initially be posted to your MyChart account which typically takes 2 to  3 days.     If any of your results are abnormal, you will receive a phone call regarding treatment.  Prescriptions, if any are needed, will be provided for you at your pharmacy.     If you have not had complete resolution of your symptoms after completing any recommended treatment or if your symptoms worsen, please return for repeat evaluation.   Thank you for visiting urgent  care today.  I appreciate the opportunity to participate in your care.        This office note has been dictated using Teaching laboratory technician.  Unfortunately, this method of dictation can sometimes lead to typographical or grammatical errors.  I apologize for your inconvenience in advance if this occurs.  Please do not hesitate to reach out to me if clarification is needed.       Theadora Rama Scales, PA-C 01/16/23 1130

## 2023-01-15 NOTE — ED Triage Notes (Signed)
Pt reports sore throat x 3 weeks.    Pt requested STD's test.

## 2023-01-15 NOTE — Discharge Instructions (Addendum)
Your strep test today is negative.  Streptococcal throat culture will be performed per our protocol.  The result of your throat culture will be posted to your MyChart once it is complete, this typically takes 3 to 5 days.  If your streptococcal throat culture is positive, you will be contacted by phone and antibiotics will prescribed for you.    Your symptoms and my physical exam findings are concerning for exacerbation of your underlying allergies.   Please read below to learn more about the medications, dosages and frequencies that I recommend to help alleviate your symptoms and to get you feeling better soon:   Zyrtec (cetirizine): This is an excellent second-generation antihistamine that helps to reduce respiratory inflammatory response to environmental allergens.  In some patients, this medication can cause daytime sleepiness so I recommend that you take 1 tablet daily at bedtime.     Flonase (fluticasone): This is a steroid nasal spray that used once daily, 1 spray in each nare.  This works best when used on a daily basis. This medication does not work well if it is only used when you think you need it.  After 3 to 5 days of use, you will notice significant reduction of the inflammation and mucus production that is currently being caused by exposure to allergens, whether seasonal or environmental.  The most common side effect of this medication is nosebleeds.  If you experience a nosebleed, please discontinue use for 1 week, then feel free to resume.  If you find that your insurance will not pay for this medication, please consider a different nasal steroids such as Nasonex (mometasone), or Nasacort (triamcinolone).   The results of your vaginal swab test which screens for BV, yeast, gonorrhea, chlamydia and trichomonas will be made posted to your MyChart account once it is complete.  This typically takes 2 to 4 days.  Please abstain from sexual intercourse of any kind, vaginal, oral or anal, until  you have received the results of your STD testing.     The results of your HIV and syphilis blood tests will be made available to you once they are complete.  They will initially be posted to your MyChart account which typically takes 2 to 3 days.     If any of your results are abnormal, you will receive a phone call regarding treatment.  Prescriptions, if any are needed, will be provided for you at your pharmacy.     If you have not had complete resolution of your symptoms after completing any recommended treatment or if your symptoms worsen, please return for repeat evaluation.   Thank you for visiting urgent care today.  I appreciate the opportunity to participate in your care.

## 2023-01-16 LAB — HIV ANTIBODY (ROUTINE TESTING W REFLEX): HIV Screen 4th Generation wRfx: NONREACTIVE

## 2023-01-16 LAB — CERVICOVAGINAL ANCILLARY ONLY
Bacterial Vaginitis (gardnerella): NEGATIVE
Candida Glabrata: NEGATIVE
Candida Vaginitis: NEGATIVE
Chlamydia: NEGATIVE
Comment: NEGATIVE
Comment: NEGATIVE
Comment: NEGATIVE
Comment: NEGATIVE
Comment: NEGATIVE
Comment: NORMAL
Neisseria Gonorrhea: NEGATIVE
Trichomonas: NEGATIVE

## 2023-01-16 LAB — CULTURE, GROUP A STREP (THRC)

## 2023-01-16 LAB — RPR: RPR Ser Ql: NONREACTIVE

## 2023-01-17 LAB — CULTURE, GROUP A STREP (THRC)

## 2023-01-18 LAB — CULTURE, GROUP A STREP (THRC)

## 2023-01-26 IMAGING — US US OB TRANSVAGINAL
1 series · 13 of 28 positions shown · non-contrast
Comparison: None.

CLINICAL DATA: Pelvic pain for 3 days. Quantitative beta HCG is
[DATE]. LMP was 03/04/2021.

EXAM:
OBSTETRIC <14 WK US AND TRANSVAGINAL OB US
TECHNIQUE: Both transabdominal and transvaginal ultrasound examinations were
performed for complete evaluation of the gestation as well as the
maternal uterus, adnexal regions, and pelvic cul-de-sac.
Transvaginal technique was performed to assess early pregnancy.

[Series 1: us ob transvaginal · 78 acquisitions, 13 frames shown]
[im 3/78]
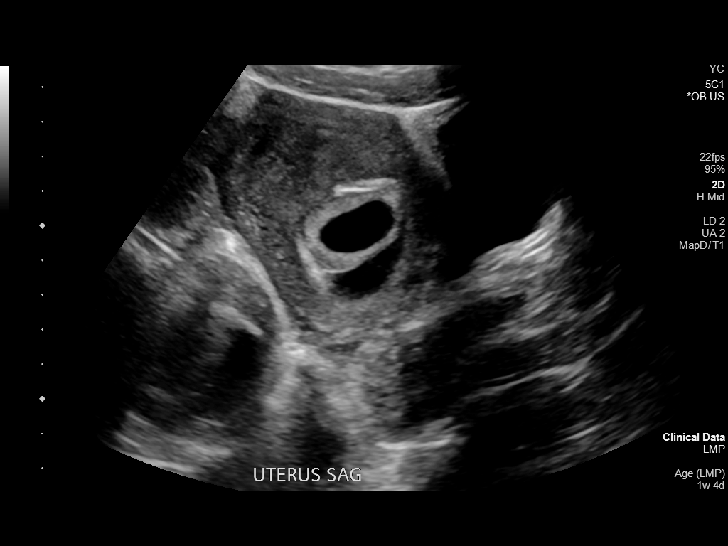
[im 9/78]
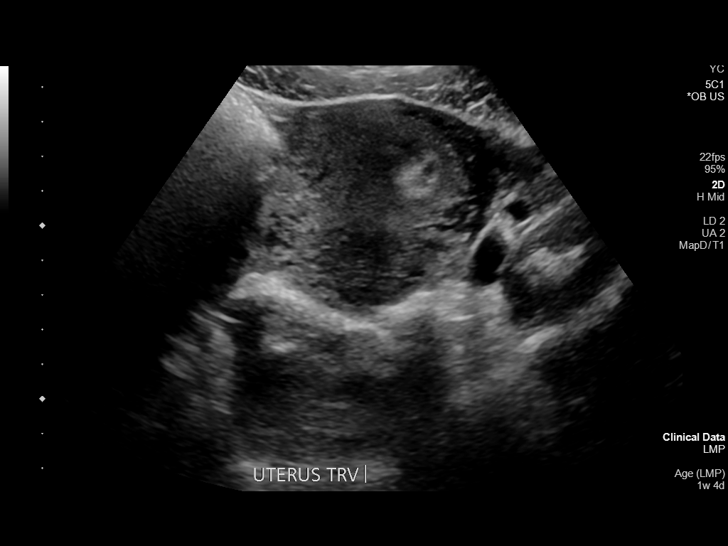
[im 15/78]
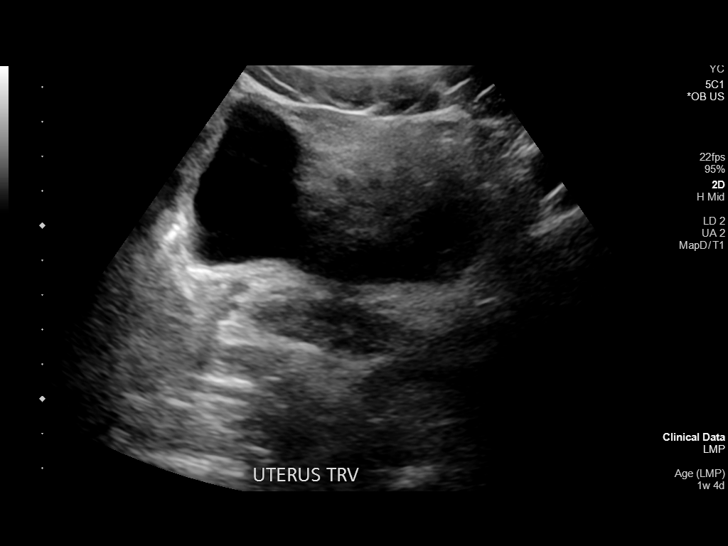
[im 20/78]
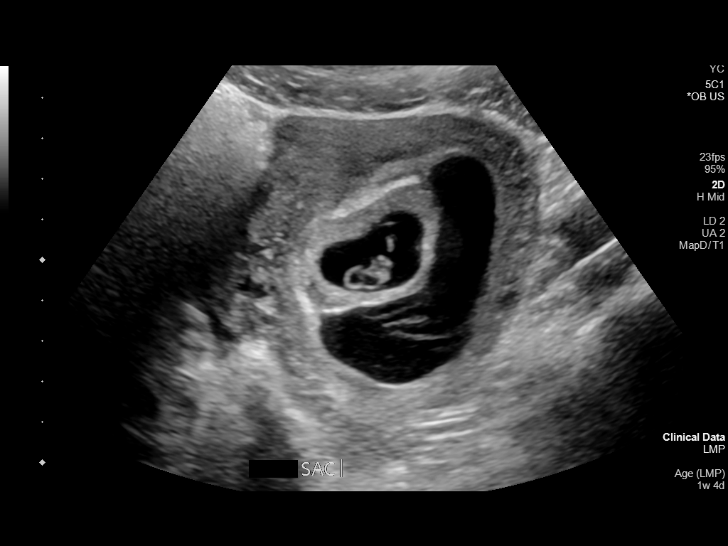
[im 26/78]
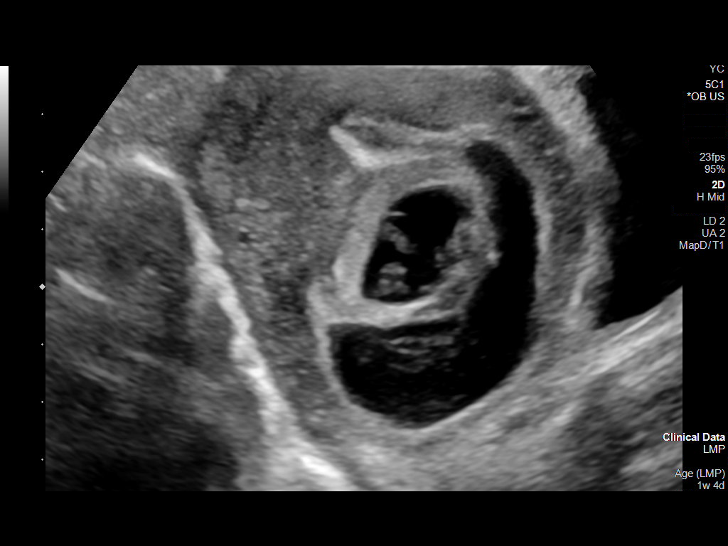
[im 32/78]
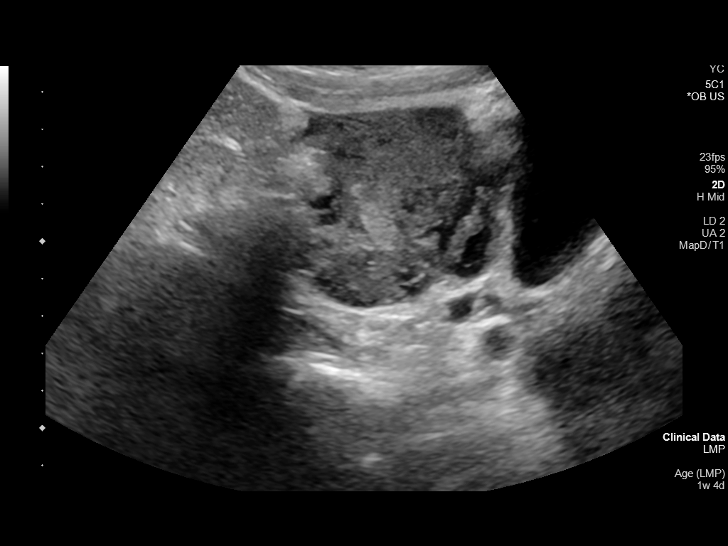
[im 40/78]
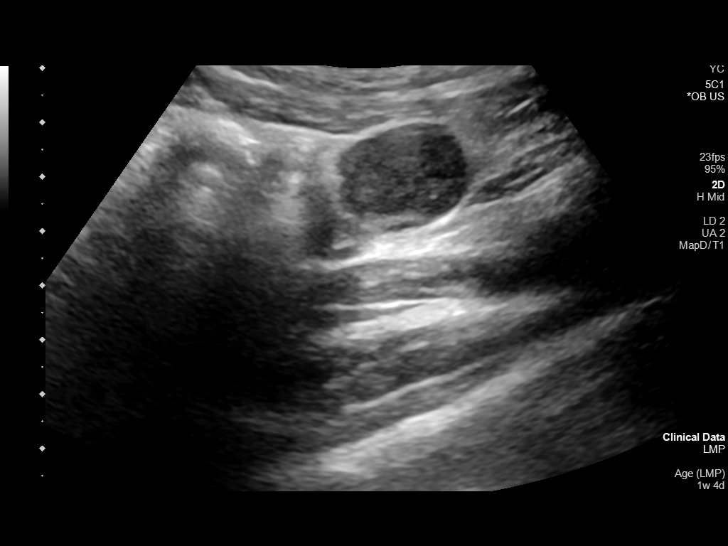
[im 46/78]
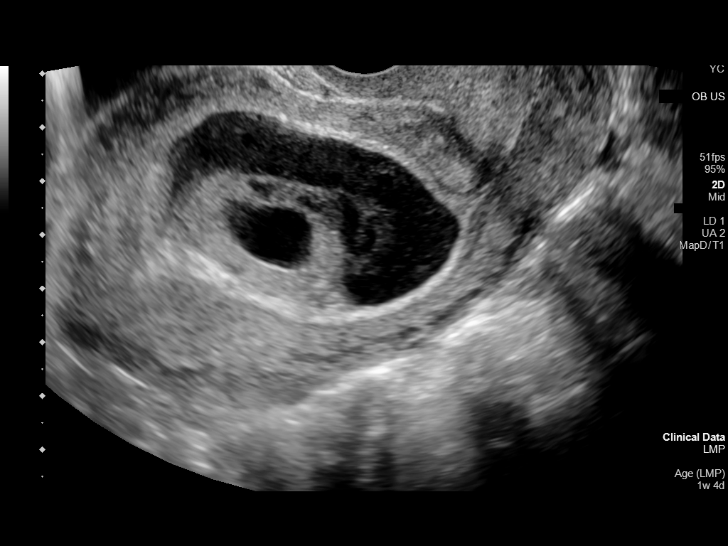
[im 52/78]
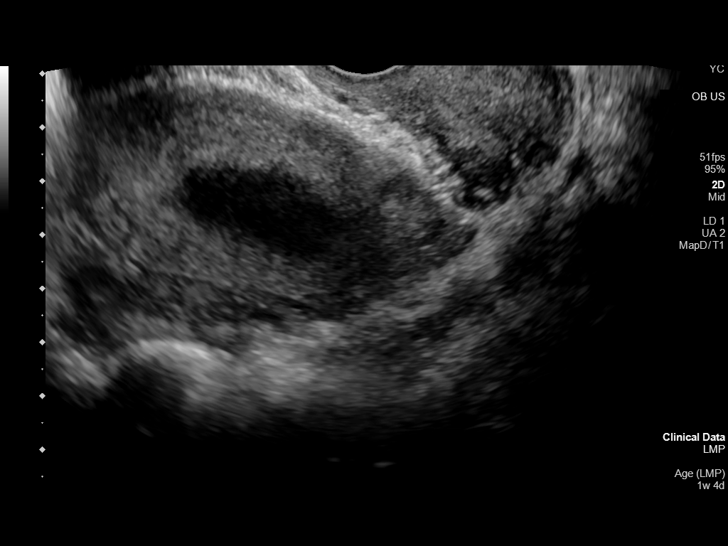
[im 58/78]
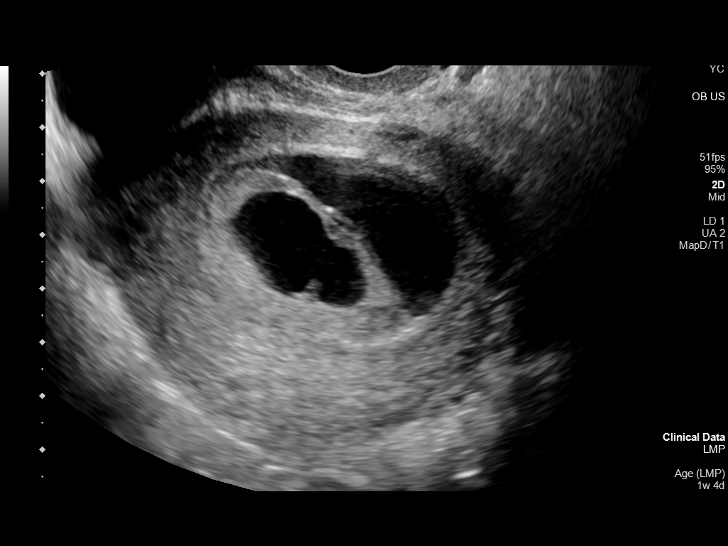
[im 63/78]
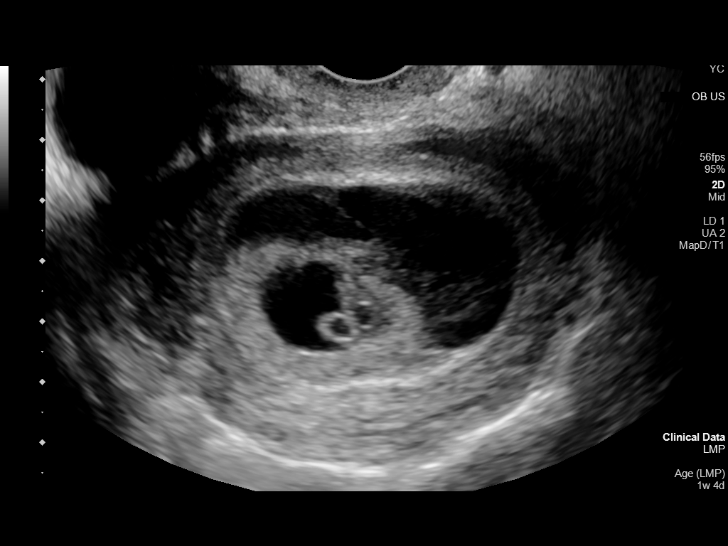
[im 69/78]
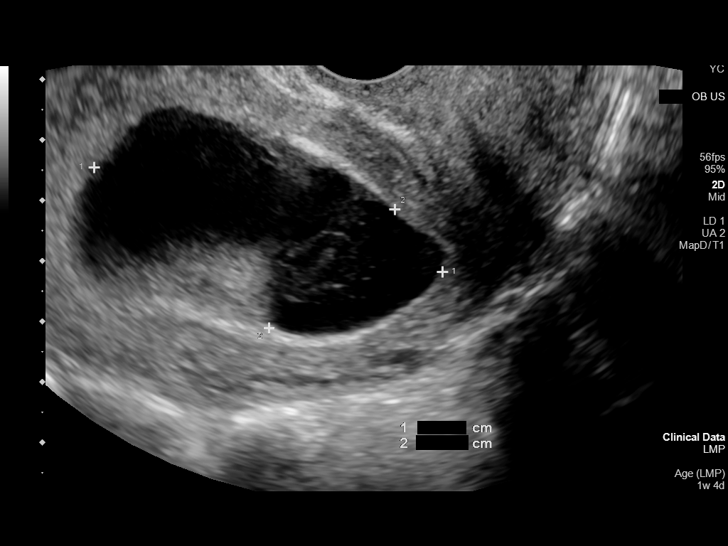
[im 75/78]
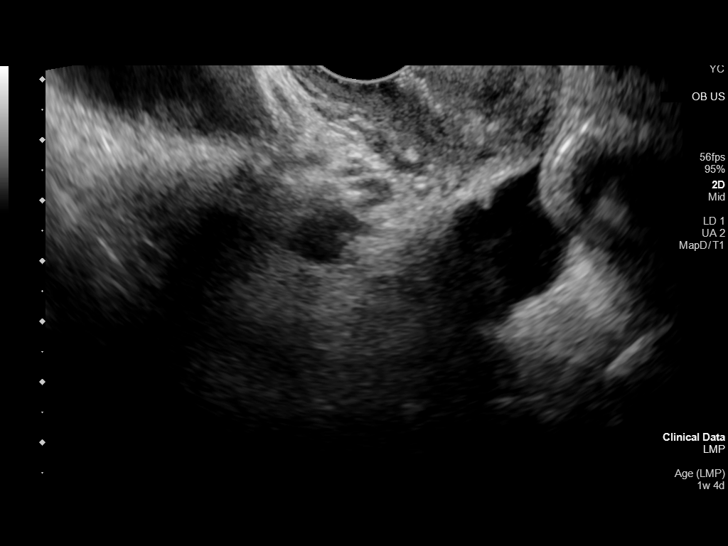

[13 of 28 positions shown; findings below may reference images not displayed]

FINDINGS: Intrauterine gestational sac: A single intrauterine gestational sac
is present.

Yolk sac:  Yolk sac is present.

Embryo:  Fetal pole is identified.

Cardiac Activity: Fetal cardiac activity is observed.

Heart Rate: 153 bpm

CRL: 16.8 mm   8 w   1 d                  US EDC: 10/24/2021

Subchorionic hemorrhage: A large subchorionic hemorrhage is
demonstrated, measuring 6 x 2.9 x 4.7 cm.

Maternal uterus/adnexae: Uterus is anteverted. No myometrial mass
lesions identified. Both ovaries are visualized and appear normal.
No abnormal adnexal masses. Small amount of free fluid in the
pelvis.
IMPRESSION: 1. Single intrauterine pregnancy is identified. Estimated
gestational age by crown-rump length is 8 weeks 1 day.
2. A large subchorionic hemorrhage is present.

## 2023-01-26 IMAGING — US US OB COMP LESS 14 WK
1 series · 13 of 28 positions shown · non-contrast
Comparison: None.

CLINICAL DATA: Pelvic pain for 3 days. Quantitative beta HCG is
[DATE]. LMP was 03/04/2021.

EXAM:
OBSTETRIC <14 WK US AND TRANSVAGINAL OB US
TECHNIQUE: Both transabdominal and transvaginal ultrasound examinations were
performed for complete evaluation of the gestation as well as the
maternal uterus, adnexal regions, and pelvic cul-de-sac.
Transvaginal technique was performed to assess early pregnancy.

[Series 1: us ob comp less 14 wk · 78 acquisitions, 13 frames shown]
[im 3/78]
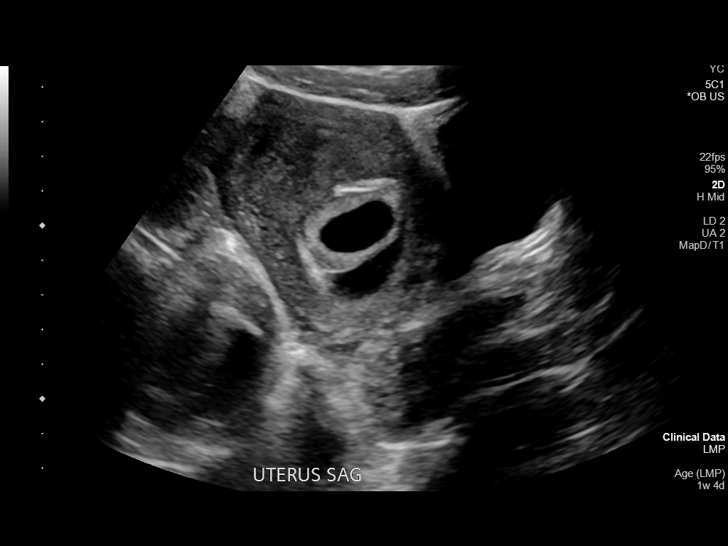
[im 9/78]
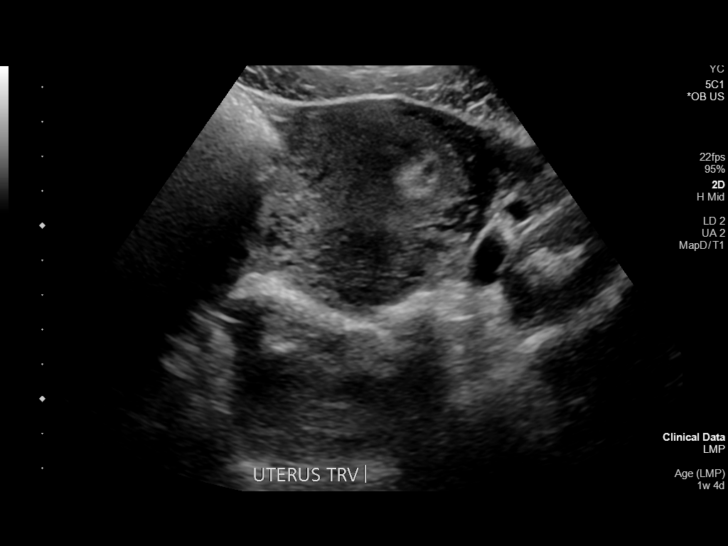
[im 15/78]
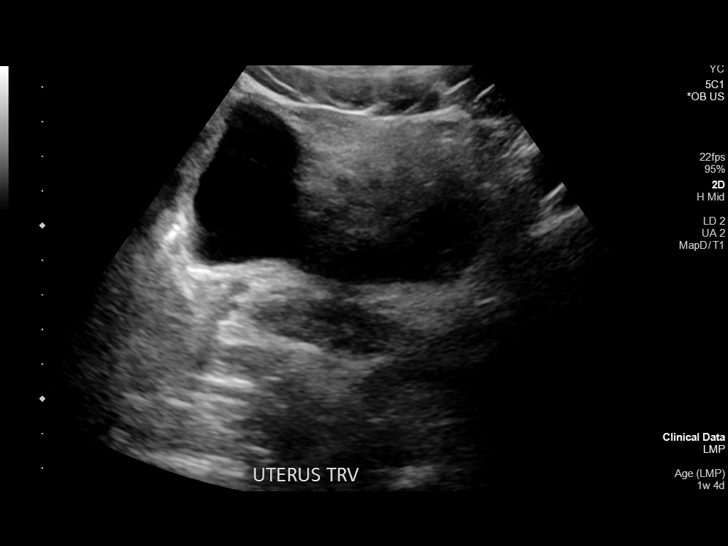
[im 20/78]
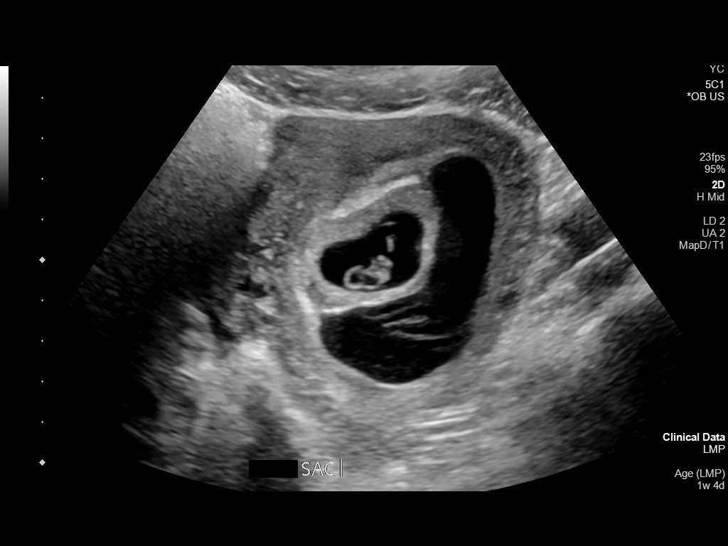
[im 26/78]
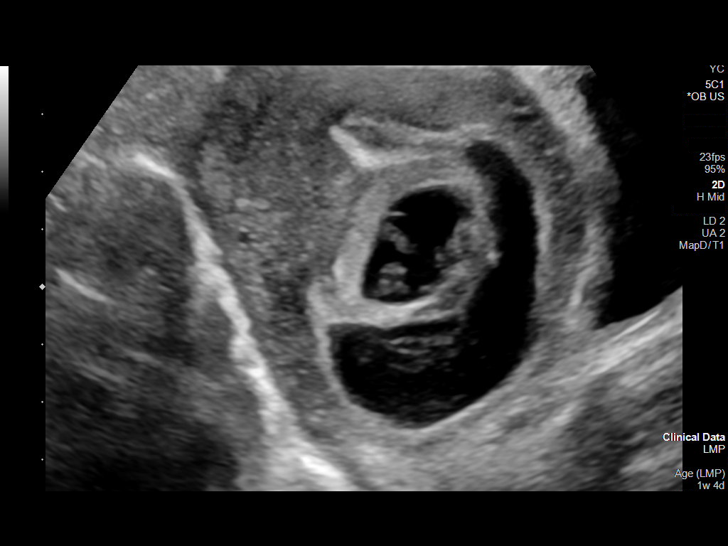
[im 32/78]
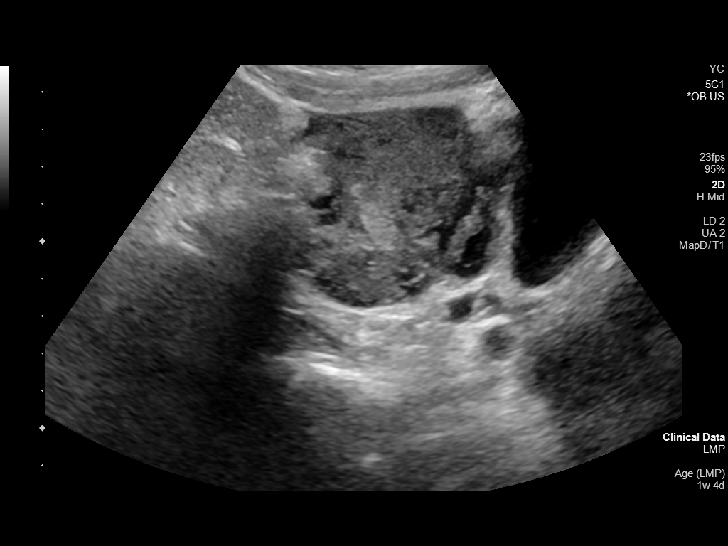
[im 40/78]
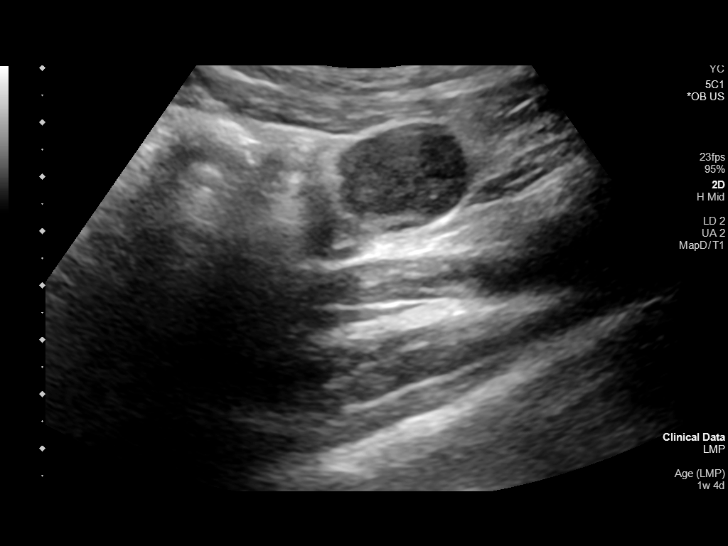
[im 46/78]
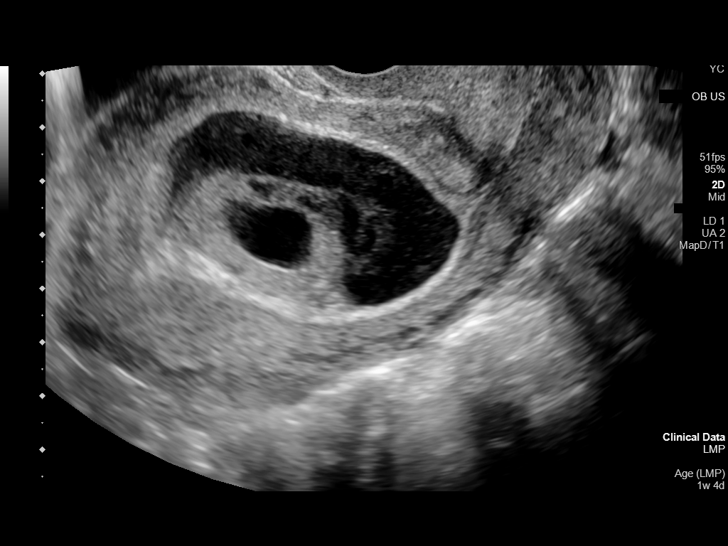
[im 52/78]
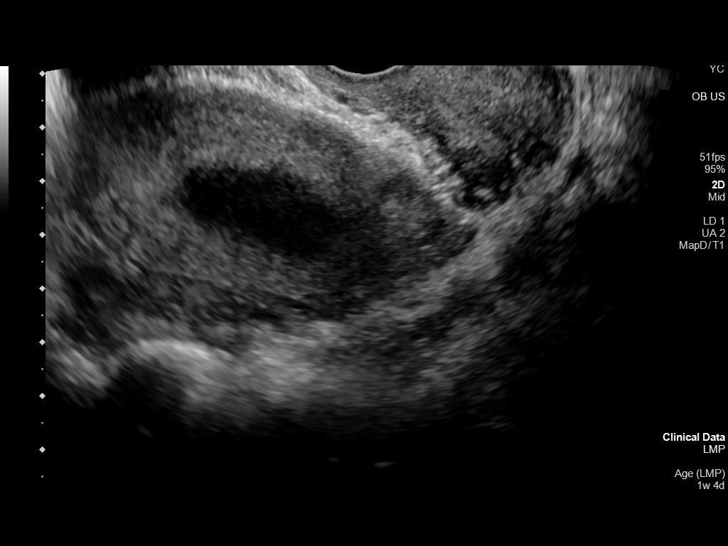
[im 58/78]
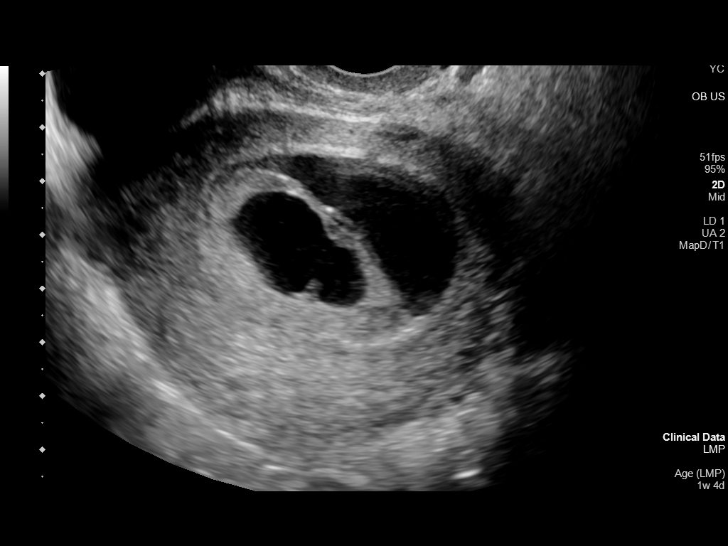
[im 63/78]
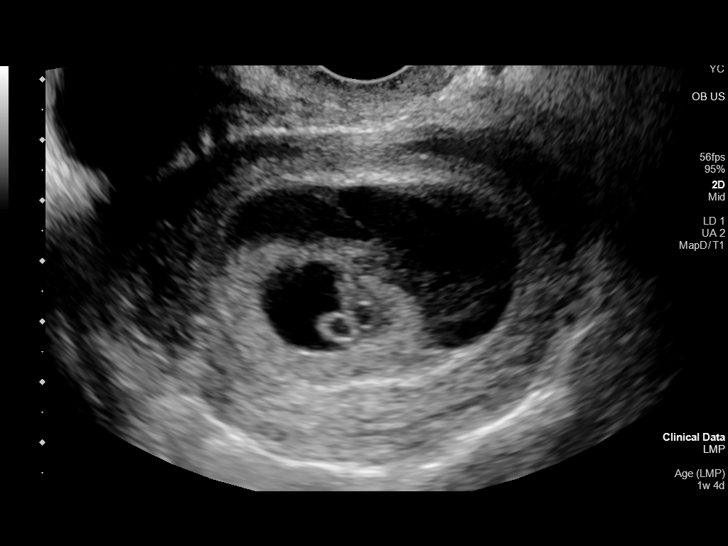
[im 69/78]
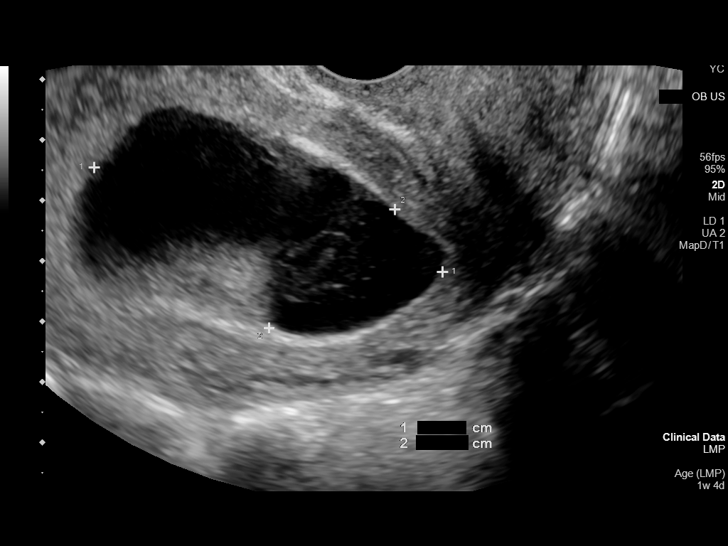
[im 75/78]
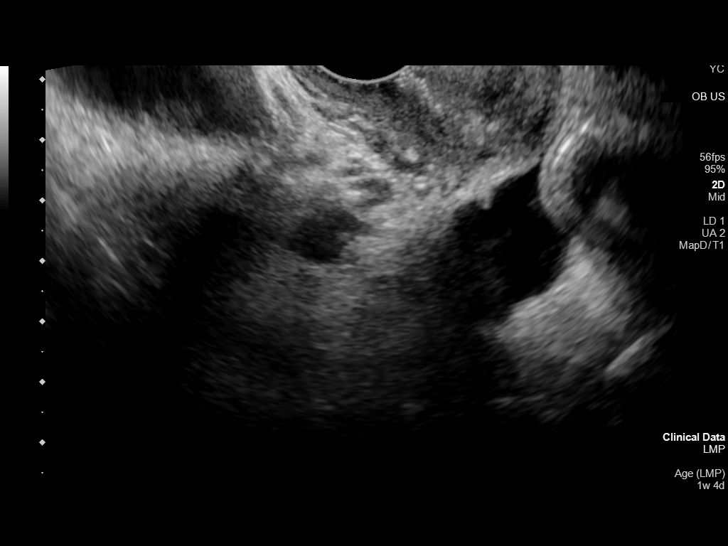

[13 of 28 positions shown; findings below may reference images not displayed]

FINDINGS: Intrauterine gestational sac: A single intrauterine gestational sac
is present.

Yolk sac:  Yolk sac is present.

Embryo:  Fetal pole is identified.

Cardiac Activity: Fetal cardiac activity is observed.

Heart Rate: 153 bpm

CRL: 16.8 mm   8 w   1 d                  US EDC: 10/24/2021

Subchorionic hemorrhage: A large subchorionic hemorrhage is
demonstrated, measuring 6 x 2.9 x 4.7 cm.

Maternal uterus/adnexae: Uterus is anteverted. No myometrial mass
lesions identified. Both ovaries are visualized and appear normal.
No abnormal adnexal masses. Small amount of free fluid in the
pelvis.
IMPRESSION: 1. Single intrauterine pregnancy is identified. Estimated
gestational age by crown-rump length is 8 weeks 1 day.
2. A large subchorionic hemorrhage is present.

## 2023-01-29 ENCOUNTER — Ambulatory Visit (INDEPENDENT_AMBULATORY_CARE_PROVIDER_SITE_OTHER): Payer: Medicaid Other

## 2023-01-29 VITALS — BP 113/89 | HR 73

## 2023-01-29 DIAGNOSIS — K149 Disease of tongue, unspecified: Secondary | ICD-10-CM

## 2023-01-29 DIAGNOSIS — Z113 Encounter for screening for infections with a predominantly sexual mode of transmission: Secondary | ICD-10-CM

## 2023-01-29 DIAGNOSIS — K1329 Other disturbances of oral epithelium, including tongue: Secondary | ICD-10-CM

## 2023-01-29 DIAGNOSIS — B370 Candidal stomatitis: Secondary | ICD-10-CM

## 2023-01-29 NOTE — Progress Notes (Signed)
Here today requesting STD screening. Reviewed recent negative results of vaginal swab, HIV, and RPR testing on 01/15/23. Pt expresses concern about oral infection due to itching on tongue. White patches visible on tongue, pt reports these have been present intermittently since taking Flagyl antibiotic a few months ago.   Oral/pharyngeal swab collected for testing for GC, CT, trich. Reviewed itching and white patches with Crissie Reese MD who recommends treatment for oral candida-- Nystatin suspension 500,000u QID x 7 days. Pt will contact office if symptoms persist. May continue treatment for 14 days or use alternative regimen.  Fleet Contras RN 01/29/23

## 2023-01-30 ENCOUNTER — Other Ambulatory Visit (HOSPITAL_COMMUNITY)
Admission: RE | Admit: 2023-01-30 | Discharge: 2023-01-30 | Disposition: A | Discharge status: Home / Self Care | Payer: Medicaid Other | Source: Ambulatory Visit | Attending: Family Medicine | Admitting: Family Medicine

## 2023-01-30 DIAGNOSIS — Z113 Encounter for screening for infections with a predominantly sexual mode of transmission: Secondary | ICD-10-CM | POA: Diagnosis not present

## 2023-01-30 DIAGNOSIS — K149 Disease of tongue, unspecified: Secondary | ICD-10-CM | POA: Insufficient documentation

## 2023-01-30 MED ORDER — NYSTATIN 100000 UNIT/ML MT SUSP
5.0000 mL | Freq: Four times a day (QID) | OROMUCOSAL | 0 refills | Status: DC
Start: 2023-01-30 — End: 2023-03-03

## 2023-01-31 ENCOUNTER — Telehealth: Payer: Medicaid Other | Admitting: Physician Assistant

## 2023-01-31 DIAGNOSIS — B370 Candidal stomatitis: Secondary | ICD-10-CM

## 2023-01-31 LAB — CYTOLOGY, (ORAL, ANAL, URETHRAL) ANCILLARY ONLY
Chlamydia: NEGATIVE
Comment: NEGATIVE
Comment: NEGATIVE
Comment: NORMAL
Neisseria Gonorrhea: NEGATIVE
Trichomonas: NEGATIVE

## 2023-01-31 MED ORDER — FLUCONAZOLE 150 MG PO TABS
150.0000 mg | ORAL_TABLET | Freq: Once | ORAL | 0 refills | Status: AC
Start: 2023-01-31 — End: 2023-01-31

## 2023-01-31 NOTE — Progress Notes (Signed)
Virtual Visit Consent   Amanda Russell, you are scheduled for a virtual visit with a [facility] provider today. Just as with appointments in the office, your consent must be obtained to participate. Your consent will be active for this visit and any virtual visit you may have with one of our providers in the next 365 days. If you have a MyChart account, a copy of this consent can be sent to you electronically.  As this is a virtual visit, video technology does not allow for your provider to perform a traditional examination. This may limit your provider's ability to fully assess your condition. If your provider identifies any concerns that need to be evaluated in person or the need to arrange testing (such as labs, EKG, etc.), we will make arrangements to do so. Although advances in technology are sophisticated, we cannot ensure that it will always work on either your end or our end. If the connection with a video visit is poor, the visit may have to be switched to a telephone visit. With either a video or telephone visit, we are not always able to ensure that we have a secure connection.  By engaging in this virtual visit, you consent to the provision of healthcare and authorize for your insurance to be billed (if applicable) for the services provided during this visit. Depending on your insurance coverage, you may receive a charge related to this service.  I need to obtain your verbal consent now. Are you willing to proceed with your visit today? Amanda Russell has provided verbal consent on 01/31/2023 for a virtual visit (video or telephone). Margaretann Loveless, PA-C  Date: 01/31/2023 2:38 PM  Virtual Visit via Video Note   I, Margaretann Loveless, connected with  Amanda Russell  ([id number], 1995-10-11) on 01/31/23 at  2:30 PM EDT by a video-enabled telemedicine application and verified that I am speaking with the correct person using two identifiers.  Location: Patient: Virtual Visit Location  Patient: Mobile Provider: Virtual Visit Location Provider: Home Office   I discussed the limitations of evaluation and management by telemedicine and the availability of in person appointments. The patient expressed understanding and agreed to proceed.    History of Present Illness: Amanda Russell is a 27 y.o. who identifies as a female who was assigned female at birth, and is being seen today for thrush symptoms. She feels she has had thrush off and on since she was on Metronidazole. She was prescribed Nystatin susp yesterday. She has started this but feels it is not working fast enough and would like to get fluconazole as well.    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) 06/10/2017   Suicidal ideation 07/14/2012   Depression 07/14/2012   ADHD (attention deficit hyperactivity disorder), combined type 07/14/2012    Allergies:  Allergies  Allergen Reactions   Pollen Extract Itching and Cough   Medications:  Current Outpatient Medications:    fluconazole (DIFLUCAN) 150 MG tablet, Take 1 tablet (150 mg total) by mouth once for 1 dose., Disp: 1 tablet, Rfl: 0   etonogestrel (  NEXPLANON) 68 MG IMPL implant, 1 each by Subdermal route once., Disp: , Rfl:    nystatin (MYCOSTATIN) 100000 UNIT/ML suspension, Take 5 mLs (500,000 Units total) by mouth 4 (four) times daily. Swish and swallow., Disp: 140 mL, Rfl: 0  Observations/Objective: Patient is well-developed, well-nourished in no acute distress.  Resting comfortably  Head is normocephalic, atraumatic.  No labored  breathing.  Speech is clear and coherent with logical content.  Patient is alert and oriented at baseline.    Assessment and Plan: 1. Thrush - fluconazole (DIFLUCAN) 150 MG tablet; Take 1 tablet (150 mg total) by mouth once for 1 dose.  Dispense: 1 tablet; Refill: 0  - Fluconazole added - Continue Nystatin - Salt water gargles - Tylenol for pain if needed - Seek in person evaluation if continues to worsen or fails to improve  Follow Up Instructions: I discussed the assessment and treatment plan with the patient. The patient was provided an opportunity to ask questions and all were answered. The patient agreed with the plan and demonstrated an understanding of the instructions.  A copy of instructions were sent to the patient via MyChart unless otherwise noted below.    The patient was advised to call back or seek an in-person evaluation if the symptoms worsen or if the condition fails to improve as anticipated.  Time:  I spent 8 minutes with the patient via telehealth technology discussing the above problems/concerns.    Margaretann Loveless, PA-C

## 2023-01-31 NOTE — Patient Instructions (Signed)
Vincente Poli, thank you for joining Margaretann Loveless, PA-C for today's virtual visit.  While this provider is not your primary care provider (PCP), if your PCP is located in our provider database this encounter information will be shared with them immediately following your visit.   A [facility] MyChart account gives you access to today's visit and all your visits, tests, and labs performed at [facility] " click here if you don't have a [facility] MyChart account or go to [email or web address]  Consent: (Patient) Amanda Russell provided verbal consent for this virtual visit at the beginning of the encounter.  Current Medications:  Current Outpatient Medications:    fluconazole (DIFLUCAN) 150 MG tablet, Take 1 tablet (150 mg total) by mouth once for 1 dose., Disp: 1 tablet, Rfl: 0   etonogestrel (NEXPLANON) 68 MG IMPL implant, 1 each by Subdermal route once., Disp: , Rfl:    nystatin (MYCOSTATIN) 100000 UNIT/ML suspension, Take 5 mLs (500,000 Units total) by mouth 4 (four) times daily. Swish and swallow., Disp: 140 mL, Rfl: 0   Medications ordered in this encounter:  Meds ordered this encounter  Medications   fluconazole (DIFLUCAN) 150 MG tablet    Sig: Take 1 tablet (150 mg total) by mouth once for 1 dose.    Dispense:  1 tablet    Refill:  0    Order Specific Question:   Supervising Provider    Answer:   Merrilee Jansky [id number]     *If you need refills on other medications prior to your next appointment, please contact your pharmacy*  Follow-Up: Call back or seek an in-person evaluation if the symptoms worsen or if the condition fails to improve as anticipated.  [facility] [phone]  Other Instructions  Oral Thrush, Adult Oral thrush, also called oral candidiasis, is a fungal infection that develops in the mouth and throat and on the tongue. It causes white patches to form in the mouth and on the tongue. Many cases of thrush are  mild, but this infection can also be serious. Amanda Russell can be a repeated (recurrent) problem for certain people who have a weak body defense system (immune system). The weakness can be caused by chronic illnesses, or by taking medicines that limit the body's ability to fight infection. If a person has difficulty fighting infection, the fungus that causes thrush can spread through the body. This can cause life-threatening blood or organ infections. What are the causes? This condition is caused by a fungus (yeast) called Candida albicans. This fungus is normally present in small amounts in the mouth and on other mucous membranes. It usually causes no harm. If conditions are present that allow the fungus to grow without control, it invades surrounding tissues and becomes an infection. Other Candida species can also lead to thrush, though this is rare. What increases the risk? The following factors may make you more likely to develop this condition: Having a weakened immune system. Being an older adult. Having diabetes, cancer, or HIV (human immunodeficiency virus). Having dry mouth (xerostomia). Being pregnant or breastfeeding. Having poor dental care, especially in those who have dentures. Using antibiotic or steroid medicines. What are the signs or symptoms? Symptoms of this condition can vary from mild and moderate to severe and persistent. Symptoms may include: A burning feeling in the mouth and throat. This can occur at the start of a thrush infection. White patches that stick to the mouth and tongue. The tissue around the patches  may be red, raw, and painful. If rubbed (during tooth brushing, for example), the patches and the tissue of the mouth may bleed easily. A bad taste in the mouth or difficulty tasting foods. A cottony feeling in the mouth. Pain during eating and swallowing. Poor appetite. Cracking at the corners of the mouth. How is this diagnosed? This condition is diagnosed based  on: A physical exam. Your medical history. How is this treated? This condition is treated with medicines called antifungals, which prevent the growth of fungi. These medicines are either applied directly to the affected area (topical) or swallowed (oral). The treatment will depend on the severity of the condition. Mild cases of thrush may be treated with an antifungal mouth rinse or lozenges. Treatment usually lasts about 14 days. Moderate to severe cases of thrush can be treated with oral antifungal medicine, if they have spread to the esophagus. A topical antifungal medicine may also be used. For some severe infections, treatment may need to continue for more than 14 days. Oral antifungal medicines are rarely used during pregnancy because they may be harmful to the unborn child. If you are pregnant, talk with your health care provider about options for treatment. Persistent or recurrent thrush. For cases of thrush that do not go away or keep coming back: Treatment may be needed twice as long as the symptoms last. Treatment will include both oral and topical antifungal medicines. People with a weakened immune system can take an antifungal medicine on a continuous basis to prevent thrush infections. It is important to treat conditions that make a person more likely to get thrush, such as diabetes or HIV. Follow these instructions at home: Relieving soreness and discomfort To help reduce the discomfort of thrush: Drink cold liquids such as water or iced tea. Try flavored ice treats or frozen juices. Eat foods that are easy to swallow, such as gelatin, ice cream, or custard. Try drinking from a straw if the patches in your mouth are painful.  General instructions Take or use over-the-counter and prescription medicines only as told by your health care provider. Eat plain, unflavored yogurt as directed by your health care provider. Check the label to make sure the yogurt contains live cultures.  This yogurt can help healthy bacteria grow in the mouth and can stop the growth of the fungus that causes thrush. If you wear dentures, remove the dentures before going to bed, brush them vigorously, and soak them in a cleaning solution as directed by your health care provider. Rinse your mouth with a warm salt-water mixture several times a day. To make a salt-water mixture, dissolve -1 tsp (3-6 g) of salt in 1 cup (237 mL) of warm water. Contact a health care provider if: Your symptoms are getting worse or are not improving within 7 days of starting treatment. You have symptoms of a spreading infection, such as white patches on the skin outside of the mouth. You are breastfeeding your baby and you have redness and pain in the nipples. Summary Oral thrush, also called oral candidiasis, is a fungal infection that develops in the mouth and throat and on the tongue. It causes white patches to form in the mouth and on the tongue. You are more likely to get this condition if you have a weakened immune system or an underlying condition, such as HIV, cancer, or diabetes. This condition is treated with medicines called antifungals, which prevent the growth of fungi. Contact a health care provider if your symptoms do not  improve, or get worse, within 7 days of starting treatment. This information is not intended to replace advice given to you by your health care provider. Make sure you discuss any questions you have with your health care provider. Document Revised: 05/17/2021 Document Reviewed: 07/24/2019 Elsevier Patient Education  2023 Elsevier Inc.    If you have been instructed to have an in-person evaluation today at a local Urgent Care facility, please use the link below. It will take you to a list of all of our available [facility], including address, phone number and hours of operation. Please do not delay care.  [facility]  If you or a family member do not have a  primary care provider, use the link below to schedule a visit and establish care. When you choose a [facility] primary care physician or advanced practice provider, you gain a long-term partner in health. Find a Primary Care Provider  Learn more about [facility]'s in-office and virtual care options: [facility] - Get Care Now

## 2023-03-03 ENCOUNTER — Telehealth: Payer: Medicaid Other | Admitting: Physician Assistant

## 2023-03-03 DIAGNOSIS — B370 Candidal stomatitis: Secondary | ICD-10-CM | POA: Diagnosis not present

## 2023-03-03 DIAGNOSIS — B3731 Acute candidiasis of vulva and vagina: Secondary | ICD-10-CM | POA: Diagnosis not present

## 2023-03-03 MED ORDER — FLUCONAZOLE 150 MG PO TABS: 150.0000 mg | ORAL_TABLET | Freq: Every day | ORAL | 0 refills | Status: DC

## 2023-03-03 MED ORDER — NYSTATIN 100000 UNIT/ML MT SUSP: 5.0000 mL | Freq: Four times a day (QID) | OROMUCOSAL | 0 refills | Status: DC

## 2023-03-03 NOTE — Patient Instructions (Signed)
  Vincente Poli, thank you for joining Tylene Fantasia Ward, PA-C for today's virtual visit.  While this provider is not your primary care provider (PCP), if your PCP is located in our provider database this encounter information will be shared with them immediately following your visit.   A [facility] MyChart account gives you access to today's visit and all your visits, tests, and labs performed at [facility] " click here if you don't have a [facility] MyChart account or go to [email or web address]  Consent: (Patient) Amanda Russell provided verbal consent for this virtual visit at the beginning of the encounter.  Current Medications:  Current Outpatient Medications:    fluconazole (DIFLUCAN) 150 MG tablet, Take 1 tablet (150 mg total) by mouth daily., Disp: 1 tablet, Rfl: 0   nystatin (MYCOSTATIN) 100000 UNIT/ML suspension, Take 5 mLs (500,000 Units total) by mouth 4 (four) times daily., Disp: 60 mL, Rfl: 0   etonogestrel (NEXPLANON) 68 MG IMPL implant, 1 each by Subdermal route once., Disp: , Rfl:    Medications ordered in this encounter:  Meds ordered this encounter  Medications   fluconazole (DIFLUCAN) 150 MG tablet    Sig: Take 1 tablet (150 mg total) by mouth daily.    Dispense:  1 tablet    Refill:  0    Order Specific Question:   Supervising Provider    Answer:   Merrilee Jansky [id number]   nystatin (MYCOSTATIN) 100000 UNIT/ML suspension    Sig: Take 5 mLs (500,000 Units total) by mouth 4 (four) times daily.    Dispense:  60 mL    Refill:  0    Order Specific Question:   Supervising Provider    Answer:   Merrilee Jansky [[id number]]     *If you need refills on other medications prior to your next appointment, please contact your pharmacy*  Follow-Up: Call back or seek an in-person evaluation if the symptoms worsen or if the condition fails to improve as anticipated.  [facility] [phone]  Other Instructions Take diflucan as  prescribed.  Keep upcoming appointment this week with your primary care physician.    If you have been instructed to have an in-person evaluation today at a local Urgent Care facility, please use the link below. It will take you to a list of all of our available [facility], including address, phone number and hours of operation. Please do not delay care.  [facility]  If you or a family member do not have a primary care provider, use the link below to schedule a visit and establish care. When you choose a [facility] primary care physician or advanced practice provider, you gain a long-term partner in health. Find a Primary Care Provider  Learn more about [facility]'s in-office and virtual care options: [facility] - Get Care Now

## 2023-03-03 NOTE — Progress Notes (Signed)
Virtual Visit Consent   [name], you are scheduled for a virtual visit with a [facility] provider today. Just as with appointments in the office, your consent must be obtained to participate. Your consent will be active for this visit and any virtual visit you may have with one of our providers in the next 365 days. If you have a MyChart account, a copy of this consent can be sent to you electronically.  As this is a virtual visit, video technology does not allow for your provider to perform a traditional examination. This may limit your provider's ability to fully assess your condition. If your provider identifies any concerns that need to be evaluated in person or the need to arrange testing (such as labs, EKG, etc.), we will make arrangements to do so. Although advances in technology are sophisticated, we cannot ensure that it will always work on either your end or our end. If the connection with a video visit is poor, the visit may have to be switched to a telephone visit. With either a video or telephone visit, we are not always able to ensure that we have a secure connection.  By engaging in this virtual visit, you consent to the provision of healthcare and authorize for your insurance to be billed (if applicable) for the services provided during this visit. Depending on your insurance coverage, you may receive a charge related to this service.  I need to obtain your verbal consent now. Are you willing to proceed with your visit today? IVET SZYMBORSKI has provided verbal consent on 03/03/2023 for a virtual visit (video or telephone). Tylene Fantasia Ward, PA-C  Date: 03/03/2023 7:09 PM  Virtual Visit via Video Note   I, Tylene Fantasia Ward, connected with  Amanda Russell  ([id number], 1996/05/14) on 03/03/23 at  7:00 PM EDT by a video-enabled telemedicine application and verified that I am speaking with the correct person using two identifiers.  Location: Patient: Virtual Visit Location Patient:  Home Provider: Virtual Visit Location Provider: Office/Clinic   I discussed the limitations of evaluation and management by telemedicine and the availability of in person appointments. The patient expressed understanding and agreed to proceed.    History of Present Illness: Amanda Russell is a 27 y.o. who identifies as a female who was assigned female at birth, and is being seen today for vaginal discharge with itching and burning.  Denies dysuria, abdominal pain, foul odor.  She also complains of recurrent oral thrus, treated with nystatin about one month ago.  Reports sx resolved, but feels like it is coming back now.  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) 06/10/2017   Suicidal ideation 07/14/2012   Depression 07/14/2012   ADHD (attention deficit hyperactivity disorder), combined type 07/14/2012    Allergies:  Allergies  Allergen Reactions   Pollen Extract Itching and Cough   Medications:  Current Outpatient Medications:    fluconazole (DIFLUCAN) 150 MG tablet, Take 1 tablet (150 mg total) by mouth daily., Disp: 1 tablet, Rfl: 0   nystatin (MYCOSTATIN) 100000 UNIT/ML suspension, Take  5 mLs (500,000 Units total) by mouth 4 (four) times daily., Disp: 60 mL, Rfl: 0   etonogestrel (NEXPLANON) 68 MG IMPL implant, 1 each by Subdermal route once., Disp: , Rfl:   Observations/Objective: Patient is well-developed, well-nourished in no acute distress.  Resting comfortably at home.  Head is normocephalic, atraumatic.  No labored breathing.  Speech is  clear and coherent with logical content.  Patient is alert and oriented at baseline.    Assessment and Plan: 1. Vaginal yeast infection  2. Oral thrush  Diflucan prescribed as well as oral nystatin.  Advised pt to keep follow up with PCP later this week for evaluation.  May not need to continue nystatin mouthwash  Follow Up Instructions: I discussed the assessment and treatment plan with the patient. The patient was provided an opportunity to ask questions and all were answered. The patient agreed with the plan and demonstrated an understanding of the instructions.  A copy of instructions were sent to the patient via MyChart unless otherwise noted below.     The patient was advised to call back or seek an in-person evaluation if the symptoms worsen or if the condition fails to improve as anticipated.  Time:  I spent 9 minutes with the patient via telehealth technology discussing the above problems/concerns.    Tylene Fantasia Ward, PA-C

## 2023-03-05 ENCOUNTER — Ambulatory Visit (INDEPENDENT_AMBULATORY_CARE_PROVIDER_SITE_OTHER): Payer: Medicaid Other | Admitting: Obstetrics and Gynecology

## 2023-03-05 ENCOUNTER — Encounter: Payer: Self-pay | Admitting: Obstetrics and Gynecology

## 2023-03-05 ENCOUNTER — Other Ambulatory Visit (HOSPITAL_COMMUNITY)
Admission: RE | Admit: 2023-03-05 | Discharge: 2023-03-05 | Disposition: A | Discharge status: Home / Self Care | Payer: Medicaid Other | Source: Ambulatory Visit | Attending: Obstetrics and Gynecology | Admitting: Obstetrics and Gynecology

## 2023-03-05 VITALS — BP 116/71 | HR 83 | Wt 137.5 lb

## 2023-03-05 DIAGNOSIS — Z113 Encounter for screening for infections with a predominantly sexual mode of transmission: Secondary | ICD-10-CM | POA: Diagnosis present

## 2023-03-05 DIAGNOSIS — Z3046 Encounter for surveillance of implantable subdermal contraceptive: Secondary | ICD-10-CM | POA: Diagnosis not present

## 2023-03-05 DIAGNOSIS — Z22330 Carrier of Group B streptococcus: Secondary | ICD-10-CM

## 2023-03-05 DIAGNOSIS — Z8759 Personal history of other complications of pregnancy, childbirth and the puerperium: Secondary | ICD-10-CM | POA: Insufficient documentation

## 2023-03-05 DIAGNOSIS — A749 Chlamydial infection, unspecified: Secondary | ICD-10-CM

## 2023-03-05 DIAGNOSIS — A078 Other specified protozoal intestinal diseases: Secondary | ICD-10-CM | POA: Insufficient documentation

## 2023-03-05 HISTORY — DX: Personal history of other complications of pregnancy, childbirth and the puerperium: Z87.59

## 2023-03-05 HISTORY — DX: Chlamydial infection, unspecified: A74.9

## 2023-03-05 HISTORY — DX: Carrier of group B Streptococcus: Z22.330

## 2023-03-05 HISTORY — DX: Other specified protozoal intestinal diseases: A07.8

## 2023-03-05 NOTE — Progress Notes (Signed)
     GYNECOLOGY OFFICE PROCEDURE NOTE  Amanda Russell is a 27 y.o. [phone] here for Nexplanon removal.  Last pap smear was on 1/23 and was normal.  No other gynecologic concerns.   Nexplanon Removal Patient identified, informed consent performed, consent signed.   Appropriate time out taken. Nexplanon site identified.  Area prepped in usual sterile fashon. One ml of 1% lidocaine was used to anesthetize the area at the distal end of the implant. A small stab incision was made right beside the implant on the distal portion.  The Nexplanon rod was grasped using hemostats and removed without difficulty.  There was minimal blood loss. There were no complications.  3 ml of 1% lidocaine was injected around the incision for post-procedure analgesia.  Steri-strips were applied over the small incision.  A pressure bandage was applied to reduce any bruising.  The patient tolerated the procedure well and was given post procedure instructions.  Patient is planning to use abstinence for contraception. She desires STI check today and repeat in 3 months due to spouse with new sexual lifestyle.  [name], MD, FACOG Obstetrician & Gynecologist, [facility] for [facility], [facility]

## 2023-03-06 LAB — CERVICOVAGINAL ANCILLARY ONLY
Bacterial Vaginitis (gardnerella): POSITIVE — AB
Candida Glabrata: NEGATIVE
Candida Vaginitis: NEGATIVE
Chlamydia: NEGATIVE
Comment: NEGATIVE
Comment: NEGATIVE
Comment: NEGATIVE
Comment: NEGATIVE
Comment: NEGATIVE
Comment: NORMAL
Neisseria Gonorrhea: NEGATIVE
Trichomonas: NEGATIVE

## 2023-03-06 LAB — POCT URINALYSIS DIP (DEVICE)
Bilirubin Urine: NEGATIVE
Glucose, UA: NEGATIVE mg/dL
Ketones, ur: NEGATIVE mg/dL
Leukocytes,Ua: NEGATIVE
Nitrite: NEGATIVE
Protein, ur: NEGATIVE mg/dL
Specific Gravity, Urine: >=1.03 (ref 1.005–1.030)
Urobilinogen, UA: 0.2 mg/dL (ref 0.0–1.0)
pH: 7 (ref 5.0–8.0)

## 2023-03-06 LAB — HIV ANTIBODY (ROUTINE TESTING W REFLEX): HIV Screen 4th Generation wRfx: NONREACTIVE

## 2023-03-06 LAB — HEPATITIS B SURFACE ANTIGEN: Hepatitis B Surface Ag: NEGATIVE

## 2023-03-06 LAB — HEPATITIS C ANTIBODY: Hep C Virus Ab: NONREACTIVE

## 2023-03-06 LAB — RPR: RPR Ser Ql: NONREACTIVE

## 2023-03-07 ENCOUNTER — Telehealth: Payer: Medicaid Other

## 2023-03-07 ENCOUNTER — Telehealth: Payer: Medicaid Other | Admitting: Physician Assistant

## 2023-03-07 ENCOUNTER — Telehealth: Payer: Self-pay

## 2023-03-07 DIAGNOSIS — B9689 Other specified bacterial agents as the cause of diseases classified elsewhere: Secondary | ICD-10-CM

## 2023-03-07 DIAGNOSIS — N760 Acute vaginitis: Secondary | ICD-10-CM

## 2023-03-07 MED ORDER — METRONIDAZOLE 500 MG PO TABS
500.0000 mg | ORAL_TABLET | Freq: Two times a day (BID) | ORAL | 0 refills | Status: DC
Start: 2023-03-07 — End: 2023-03-08

## 2023-03-07 NOTE — Patient Instructions (Signed)
Vincente Poli, thank you for joining [name], PA-C for today's virtual visit.  While this provider is not your primary care provider (PCP), if your PCP is located in our provider database this encounter information will be shared with them immediately following your visit.   A [facility] MyChart account gives you access to today's visit and all your visits, tests, and labs performed at [facility] " click here if you don't have a [facility] MyChart account or go to [email or web address]  Consent: (Patient) Amanda Russell provided verbal consent for this virtual visit at the beginning of the encounter.  Current Medications:  Current Outpatient Medications:    etonogestrel (NEXPLANON) 68 MG IMPL implant, 1 each by Subdermal route once. (Patient not taking: Reported on 03/05/2023), Disp: , Rfl:    fluconazole (DIFLUCAN) 150 MG tablet, Take 1 tablet (150 mg total) by mouth daily., Disp: 1 tablet, Rfl: 0   nystatin (MYCOSTATIN) 100000 UNIT/ML suspension, Take 5 mLs (500,000 Units total) by mouth 4 (four) times daily., Disp: 60 mL, Rfl: 0   Medications ordered in this encounter:  No orders of the defined types were placed in this encounter.    *If you need refills on other medications prior to your next appointment, please contact your pharmacy*  Follow-Up: Call back or seek an in-person evaluation if the symptoms worsen or if the condition fails to improve as anticipated.  [facility] [phone]  Other Instructions Bacterial Vaginosis  Bacterial vaginosis is an infection of the vagina. It happens when too many normal germs (healthy bacteria) grow in the vagina. This infection can make it easier to get other infections from sex (STIs). It is very important for pregnant women to get treated. This infection can cause babies to be born early or at a low birth weight. What are the causes? This infection is caused by an increase in certain germs  that grow in the vagina. You cannot get this infection from toilet seats, bedsheets, swimming pools, or things that touch your vagina. What increases the risk? Having sex with a new person or more than one person. Having sex without protection. Douching. Having an intrauterine device (IUD). Smoking. Using drugs or drinking alcohol. These can lead you to do things that are risky. Taking certain antibiotic medicines. Being pregnant. What are the signs or symptoms? Some women have no symptoms. Symptoms may include: A discharge from your vagina. It may be gray or white. It can be watery or foamy. A fishy smell. This can happen after sex or during your menstrual period. Itching in and around your vagina. A feeling of burning or pain when you pee (urinate). How is this treated? This infection is treated with antibiotic medicines. These may be given to you as: A pill. A cream for your vagina. A medicine that you put into your vagina (suppository). If the infection comes back after treatment, you may need more antibiotics. Follow these instructions at home: Medicines Take over-the-counter and prescription medicines as told by your doctor. Take or use your antibiotic medicine as told by your doctor. Do not stop taking or using it, even if you start to feel better. General instructions If the person you have sex with is a woman, tell her that you have this infection. She will need to follow up with her doctor. If you have a female partner, he does not need to be treated. Do not have sex until you finish treatment. Drink enough fluid to keep  your pee pale yellow. Keep your vagina and butt clean. Wash the area with warm water each day. Wipe from front to back after you use the toilet. If you are breastfeeding a baby, ask your doctor if you should keep doing so during treatment. Keep all follow-up visits. How is this prevented? Self-care Do not douche. Use only warm water to wash around your  vagina. Wear underwear that is cotton or lined with cotton. Do not wear tight pants and pantyhose, especially in the summer. Safe sex Use protection when you have sex. This includes: Use condoms. Use dental dams. This is a thin layer that protects the mouth during oral sex. Limit how many people you have sex with. To prevent this infection, it is best to have sex with just one person. Get tested for STIs. The person you have sex with should also get tested. Drugs and alcohol Do not smoke or use any products that contain nicotine or tobacco. If you need help quitting, ask your doctor. Do not use drugs. Do not drink alcohol if: Your doctor tells you not to drink. You are pregnant, may be pregnant, or are planning to become pregnant. If you drink alcohol: Limit how much you have to 0-1 drink a day. Know how much alcohol is in your drink. In the [place] one drink equals one 12 oz bottle of beer (355 mL), one 5 oz glass of wine (148 mL), or one 1 oz glass of hard liquor (44 mL). Where to find more information Centers for Disease Control and Prevention: FootballExhibition.com.br American Sexual Health Association: www.ashastd.org Office on [employer] Health: http://hoffman.com/ Contact a doctor if: Your symptoms do not get better, even after you are treated. You have more discharge or pain when you pee. You have a fever or chills. You have pain in your belly (abdomen) or in the area between your hips. You have pain with sex. You bleed from your vagina between menstrual periods. Summary This infection can happen when too many germs (bacteria) grow in the vagina. This infection can make it easier to get infections from sex (STIs). Treating this can lower that chance. Get treated if you are pregnant. This infection can cause babies to be born early. Do not stop taking or using your antibiotic medicine, even if you start to feel better. This information is not intended to replace advice given to you by your  health care provider. Make sure you discuss any questions you have with your health care provider. Document Revised: 03/17/2020 Document Reviewed: 03/17/2020 Elsevier Patient Education  2024 Elsevier Inc.    If you have been instructed to have an in-person evaluation today at a local Urgent Care facility, please use the link below. It will take you to a list of all of our available [facility], including address, phone number and hours of operation. Please do not delay care.  [facility]  If you or a family member do not have a primary care provider, use the link below to schedule a visit and establish care. When you choose a [facility] primary care physician or advanced practice provider, you gain a long-term partner in health. Find a Primary Care Provider  Learn more about [facility]'s in-office and virtual care options: [facility] - Get Care Now

## 2023-03-07 NOTE — Telephone Encounter (Addendum)
-----   Message from Warden Fillers, MD sent at 03/07/2023  1:29 PM EDT ----- BV noted on swab, will offer treatment  Pt notified.    Leonette Nutting  03/07/23

## 2023-03-07 NOTE — Progress Notes (Signed)
Virtual Visit Consent   Amanda Russell, you are scheduled for a virtual visit with a [facility] provider today. Just as with appointments in the office, your consent must be obtained to participate. Your consent will be active for this visit and any virtual visit you may have with one of our providers in the next 365 days. If you have a MyChart account, a copy of this consent can be sent to you electronically.  As this is a virtual visit, video technology does not allow for your provider to perform a traditional examination. This may limit your provider's ability to fully assess your condition. If your provider identifies any concerns that need to be evaluated in person or the need to arrange testing (such as labs, EKG, etc.), we will make arrangements to do so. Although advances in technology are sophisticated, we cannot ensure that it will always work on either your end or our end. If the connection with a video visit is poor, the visit may have to be switched to a telephone visit. With either a video or telephone visit, we are not always able to ensure that we have a secure connection.  By engaging in this virtual visit, you consent to the provision of healthcare and authorize for your insurance to be billed (if applicable) for the services provided during this visit. Depending on your insurance coverage, you may receive a charge related to this service.  I need to obtain your verbal consent now. Are you willing to proceed with your visit today? Amanda Russell has provided verbal consent on 03/07/2023 for a virtual visit (video or telephone). [name], [place]  Date: 03/07/2023 4:17 PM  Virtual Visit via Video Note   I, [name], connected with  Amanda Russell  ([id number], [birth year]) on 03/07/23 at  4:00 PM EDT by a video-enabled telemedicine application and verified that I am speaking with the correct person using two identifiers.  Location: Patient: Virtual Visit Location  Patient: Home Provider: Virtual Visit Location Provider: Home Office   I discussed the limitations of evaluation and management by telemedicine and the availability of in person appointments. The patient expressed understanding and agreed to proceed.    History of Present Illness: Amanda Russell is a 26 y.o. who identifies as a female who was assigned female at birth, and is being seen today for after testing positive for BV on 6/4 after having routine testing at GYN office. Notes she saw her positive test result but has not heard from her provider. Has tried calling a few times but cannot get through. Saw via MyChart where provider noted would treat BV but checked with her pharmacy and nothing has been sent in. She is unsure of what to do.  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 (Patient not taking: Reported on 03/05/2023), Disp: , Rfl:   Observations/Objective: Patient is well-developed, well-nourished in no acute distress.  Resting comfortably at home.  Head is normocephalic, atraumatic.  No  labored breathing.  Speech is clear and coherent with logical content.  Patient is alert and oriented at baseline.   Assessment and Plan: 1. BV (bacterial vaginosis) - metroNIDAZOLE (FLAGYL) 500 MG tablet; Take 1 tablet (500 mg total) by mouth 2 (two) times daily.  Dispense: 14 tablet; Refill: 0  + test result at GYN. GYN already noted to treat but medication not sent. Since patient cannot get through to them, will go ahead  and treat with course of Flagyl. Follow-up with GYN discussed.   Follow Up Instructions: I discussed the assessment and treatment plan with the patient. The patient was provided an opportunity to ask questions and all were answered. The patient agreed with the plan and demonstrated an understanding of the instructions.  A copy of instructions were sent to the patient via MyChart unless otherwise noted below.   The patient was advised to call back or seek an in-person evaluation if the symptoms worsen or if the condition fails to improve as anticipated.  Time:  I spent 10 minutes with the patient via telehealth technology discussing the above problems/concerns.    [name], PA-C

## 2023-03-08 ENCOUNTER — Telehealth: Payer: Medicaid Other | Admitting: Nurse Practitioner

## 2023-03-08 ENCOUNTER — Telehealth: Payer: Medicaid Other

## 2023-03-08 DIAGNOSIS — B9689 Other specified bacterial agents as the cause of diseases classified elsewhere: Secondary | ICD-10-CM | POA: Diagnosis not present

## 2023-03-08 DIAGNOSIS — N760 Acute vaginitis: Secondary | ICD-10-CM | POA: Diagnosis not present

## 2023-03-08 MED ORDER — METRONIDAZOLE 500 MG PO TABS
500.0000 mg | ORAL_TABLET | Freq: Two times a day (BID) | ORAL | 0 refills | Status: AC
Start: 2023-03-08 — End: 2023-03-14

## 2023-03-08 NOTE — Progress Notes (Signed)
Virtual Visit Consent   Amanda Russell, you are scheduled for a virtual visit with a [facility] provider today. Just as with appointments in the office, your consent must be obtained to participate. Your consent will be active for this visit and any virtual visit you may have with one of our providers in the next 365 days. If you have a MyChart account, a copy of this consent can be sent to you electronically.  As this is a virtual visit, video technology does not allow for your provider to perform a traditional examination. This may limit your provider's ability to fully assess your condition. If your provider identifies any concerns that need to be evaluated in person or the need to arrange testing (such as labs, EKG, etc.), we will make arrangements to do so. Although advances in technology are sophisticated, we cannot ensure that it will always work on either your end or our end. If the connection with a video visit is poor, the visit may have to be switched to a telephone visit. With either a video or telephone visit, we are not always able to ensure that we have a secure connection.  By engaging in this virtual visit, you consent to the provision of healthcare and authorize for your insurance to be billed (if applicable) for the services provided during this visit. Depending on your insurance coverage, you may receive a charge related to this service.  I need to obtain your verbal consent now. Are you willing to proceed with your visit today? [name] has provided verbal consent on 03/08/2023 for a virtual visit (video or telephone). [name], FNP  Date: 03/08/2023 11:33 AM  Virtual Visit via Video Note   I, [name], connected with  Amanda Russell  ([id number], November 10, 1995) on 03/08/23 at 11:30 AM EDT by a video-enabled telemedicine application and verified that I am speaking with the correct person using two identifiers.  Location: Patient: Virtual Visit Location Patient:  Home Provider: Virtual Visit Location Provider: Home Office   I discussed the limitations of evaluation and management by telemedicine and the availability of in person appointments. The patient expressed understanding and agreed to proceed.    History of Present Illness: Amanda Russell is a 27 y.o. who identifies as a female who was assigned female at birth, and is being seen today for follow up on visit from yesterday. 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 (Patient not taking: Reported on 03/05/2023), Disp: , Rfl:    metroNIDAZOLE (FLAGYL) 500 MG tablet, Take 1 tablet (500 mg total) by mouth 2 (two) times daily., Disp: 14 tablet, Rfl: 0  Observations/Objective: Patient is well-developed, well-nourished in no acute distress.  Resting comfortably  at home.  Head is normocephalic, atraumatic.  No labored breathing.  Speech is clear and coherent with logical content.  Patient is alert and oriented at baseline.    Assessment and Plan:  1. Bacterial vaginitis  - metroNIDAZOLE (FLAGYL) 500 MG tablet; Take 1 tablet (500 mg total) by mouth 2 (two) times daily for 6 days.  Dispense: 12 tablet; Refill: 0     Follow Up Instructions: I discussed the assessment and treatment plan with the patient. The patient was provided an opportunity to ask questions and all were answered. The patient agreed with the plan and demonstrated an understanding of the  instructions.  A copy of instructions were sent to the patient via MyChart unless otherwise noted below.    The patient was advised to call back or seek an in-person evaluation if the symptoms worsen or if the condition fails to improve as anticipated.  Time:  I spent 5 minutes with the patient via telehealth technology discussing the above problems/concerns.    [name], FNP

## 2023-03-20 ENCOUNTER — Telehealth: Payer: Medicaid Other | Admitting: Nurse Practitioner

## 2023-03-20 DIAGNOSIS — Z7251 High risk heterosexual behavior: Secondary | ICD-10-CM | POA: Diagnosis not present

## 2023-03-20 NOTE — Progress Notes (Signed)
Virtual Visit Consent   Amanda Russell, you are scheduled for a virtual visit with a [facility] provider today. Just as with appointments in the office, your consent must be obtained to participate. Your consent will be active for this visit and any virtual visit you may have with one of our providers in the next 365 days. If you have a MyChart account, a copy of this consent can be sent to you electronically.  As this is a virtual visit, video technology does not allow for your provider to perform a traditional examination. This may limit your provider's ability to fully assess your condition. If your provider identifies any concerns that need to be evaluated in person or the need to arrange testing (such as labs, EKG, etc.), we will make arrangements to do so. Although advances in technology are sophisticated, we cannot ensure that it will always work on either your end or our end. If the connection with a video visit is poor, the visit may have to be switched to a telephone visit. With either a video or telephone visit, we are not always able to ensure that we have a secure connection.  By engaging in this virtual visit, you consent to the provision of healthcare and authorize for your insurance to be billed (if applicable) for the services provided during this visit. Depending on your insurance coverage, you may receive a charge related to this service.  I need to obtain your verbal consent now. Are you willing to proceed with your visit today? Amanda Russell has provided verbal consent on 03/20/2023 for a virtual visit (video or telephone). [name], FNP  Date: 03/20/2023 10:45 AM  Virtual Visit via Video Note   I, [name], connected with  Amanda Russell  ([id number], 21-Feb-1996) on 03/20/23 at 10:45 AM EDT by a video-enabled telemedicine application and verified that I am speaking with the correct person using two identifiers.  Location: Patient: Virtual Visit Location Patient:  Home Provider: Virtual Visit Location Provider: Home Office   I discussed the limitations of evaluation and management by telemedicine and the availability of in person appointments. The patient expressed understanding and agreed to proceed.    History of Present Illness: Amanda Russell is a 27 y.o. who identifies as a female who was assigned female at birth, and is being seen today for request of Plan B prescription.   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 (Patient not taking: Reported on 03/05/2023), Disp: , Rfl:   Observations/Objective: Patient is well-developed, well-nourished in no acute distress.  Resting comfortably  at home.  Head is normocephalic, atraumatic.  No labored breathing.  Speech is clear and coherent with logical content.  Patient is alert and oriented at baseline.    Assessment and Plan: 1. Unprotected sex  Advised to call OBGYN for request unable to Rx Plan B virtually at this time Also advised this is available OTC if she is unable to get a prescription  Follow Up Instructions: I discussed the assessment and treatment plan with the patient. The patient was provided an opportunity to ask questions and all were answered. The patient agreed with the plan and demonstrated an understanding of the instructions.  A copy of instructions were sent to the patient via MyChart unless otherwise noted below.    The patient was advised to call back or seek an in-person evaluation if the  symptoms worsen or if the condition fails to improve as anticipated.  Time:  I spent 7 minutes with the patient via telehealth technology discussing the above problems/concerns.    [name], FNP

## 2023-03-21 ENCOUNTER — Telehealth: Payer: Medicaid Other | Admitting: Family Medicine

## 2023-03-21 DIAGNOSIS — N898 Other specified noninflammatory disorders of vagina: Secondary | ICD-10-CM | POA: Diagnosis not present

## 2023-03-21 DIAGNOSIS — Z7251 High risk heterosexual behavior: Secondary | ICD-10-CM | POA: Diagnosis not present

## 2023-03-21 NOTE — Progress Notes (Signed)
Virtual Visit Consent   Amanda Russell, you are scheduled for a virtual visit with a [facility] provider today. Just as with appointments in the office, your consent must be obtained to participate. Your consent will be active for this visit and any virtual visit you may have with one of our providers in the next 365 days. If you have a MyChart account, a copy of this consent can be sent to you electronically.  As this is a virtual visit, video technology does not allow for your provider to perform a traditional examination. This may limit your provider's ability to fully assess your condition. If your provider identifies any concerns that need to be evaluated in person or the need to arrange testing (such as labs, EKG, etc.), we will make arrangements to do so. Although advances in technology are sophisticated, we cannot ensure that it will always work on either your end or our end. If the connection with a video visit is poor, the visit may have to be switched to a telephone visit. With either a video or telephone visit, we are not always able to ensure that we have a secure connection.  By engaging in this virtual visit, you consent to the provision of healthcare and authorize for your insurance to be billed (if applicable) for the services provided during this visit. Depending on your insurance coverage, you may receive a charge related to this service.  I need to obtain your verbal consent now. Are you willing to proceed with your visit today? Amanda Russell has provided verbal consent on 03/21/2023 for a virtual visit (video or telephone). Freddy Finner, NP  Date: 03/21/2023 3:45 PM  Virtual Visit via Video Note   I, Freddy Finner, connected with  Amanda Russell  ([id number], January 26, 1996) on 03/21/23 at  3:45 PM EDT by a video-enabled telemedicine application and verified that I am speaking with the correct person using two identifiers.  Location: Patient: Virtual Visit Location Patient:  Home Provider: Virtual Visit Location Provider: Home Office   I discussed the limitations of evaluation and management by telemedicine and the availability of in person appointments. The patient expressed understanding and agreed to proceed.    History of Present Illness: Amanda Russell is a 27 y.o. who identifies as a female who was assigned female at birth, and is being seen today for vaginal itching.  Reports sleeping unprotected with baby's father. And has been having itching since. Unsure if she has been exposed to a STI She has been treated for BV at the start of the month. Recently also had nexplanon removed at start of June. She had a Virtual visit yesterday as well to ask about Plan B- and was provided with options regarding this.     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  Observations/Objective: Patient is well-developed, well-nourished in no acute distress.  Resting comfortably  at home.  Head is normocephalic, atraumatic.  No labored breathing.  Speech is clear and coherent with logical content.  Patient is alert and oriented at baseline.   Assessment and Plan:   1. Vaginal itching   2. Unprotected sex   -advised that she go be seen at the local UC  or health dept if she rather for STI screening and treatment if needed.   Follow Up Instructions: I discussed the assessment and treatment plan with the patient. The patient was provided an opportunity to ask questions and all were answered. The patient agreed with the plan and demonstrated an understanding of the instructions.  A copy of instructions were sent to the patient via MyChart unless otherwise noted below.     The patient was  advised to call back or seek an in-person evaluation if the symptoms worsen or if the condition fails to improve as anticipated.  Time:  I spent 7 minutes with the patient via telehealth technology discussing the above problems/concerns.    Freddy Finner, NP

## 2023-03-21 NOTE — Patient Instructions (Signed)
  Vincente Poli, thank you for joining Freddy Finner, NP for today's virtual visit.  While this provider is not your primary care provider (PCP), if your PCP is located in our provider database this encounter information will be shared with them immediately following your visit.   A [facility] MyChart account gives you access to today's visit and all your visits, tests, and labs performed at [facility] " click here if you don't have a [facility] MyChart account or go to [email or web address]  Consent: (Patient) Amanda Russell provided verbal consent for this virtual visit at the beginning of the encounter.  Current Medications: No current outpatient medications on file.   Medications ordered in this encounter:  No orders of the defined types were placed in this encounter.    *If you need refills on other medications prior to your next appointment, please contact your pharmacy*  Follow-Up: Call back or seek an in-person evaluation if the symptoms worsen or if the condition fails to improve as anticipated.  [facility] [phone]  Other Instructions   If you have been instructed to have an in-person evaluation today at a local Urgent Care facility, please use the link below. It will take you to a list of all of our available [facility], including address, phone number and hours of operation. Please do not delay care.  [facility]  If you or a family member do not have a primary care provider, use the link below to schedule a visit and establish care. When you choose a [facility] primary care physician or advanced practice provider, you gain a long-term partner in health. Find a Primary Care Provider  Learn more about [facility]'s in-office and virtual care options: [facility] - Get Care Now

## 2023-03-24 ENCOUNTER — Other Ambulatory Visit: Payer: Self-pay

## 2023-03-24 ENCOUNTER — Emergency Department (HOSPITAL_COMMUNITY)
Admission: EM | Admit: 2023-03-24 | Discharge: 2023-03-25 | Disposition: A | Discharge status: Home / Self Care | Payer: Medicaid Other | Attending: Emergency Medicine | Admitting: Emergency Medicine

## 2023-03-24 ENCOUNTER — Emergency Department (HOSPITAL_COMMUNITY): Payer: Medicaid Other

## 2023-03-24 ENCOUNTER — Encounter (HOSPITAL_COMMUNITY): Payer: Self-pay

## 2023-03-24 DIAGNOSIS — R519 Headache, unspecified: Secondary | ICD-10-CM

## 2023-03-24 DIAGNOSIS — R079 Chest pain, unspecified: Secondary | ICD-10-CM

## 2023-03-24 DIAGNOSIS — N898 Other specified noninflammatory disorders of vagina: Secondary | ICD-10-CM | POA: Insufficient documentation

## 2023-03-24 DIAGNOSIS — R1032 Left lower quadrant pain: Secondary | ICD-10-CM | POA: Insufficient documentation

## 2023-03-24 LAB — COMPREHENSIVE METABOLIC PANEL
ALT: 31 U/L (ref 0–44)
AST: 33 U/L (ref 15–41)
Albumin: 3.8 g/dL (ref 3.5–5.0)
Alkaline Phosphatase: 96 U/L (ref 38–126)
Anion gap: 8 (ref 5–15)
BUN: 14 mg/dL (ref 6–20)
CO2: 24 mmol/L (ref 22–32)
Calcium: 8.6 mg/dL — ABNORMAL LOW (ref 8.9–10.3)
Chloride: 104 mmol/L (ref 98–111)
Creatinine, Ser: 0.78 mg/dL (ref 0.44–1.00)
GFR, Estimated: >60 mL/min (ref >60)
Glucose, Bld: 95 mg/dL (ref 70–99)
Potassium: 3.8 mmol/L (ref 3.5–5.1)
Sodium: 136 mmol/L (ref 135–145)
Total Bilirubin: 0.3 mg/dL (ref 0.3–1.2)
Total Protein: 6.8 g/dL (ref 6.5–8.1)

## 2023-03-24 LAB — CBC
HCT: 34.8 % — ABNORMAL LOW (ref 36.0–46.0)
Hemoglobin: 11.8 g/dL — ABNORMAL LOW (ref 12.0–15.0)
MCH: 29 pg (ref 26.0–34.0)
MCHC: 33.9 g/dL (ref 30.0–36.0)
MCV: 85.5 fL (ref 80.0–100.0)
Platelets: 206 10*3/uL (ref 150–400)
RBC: 4.07 MIL/uL (ref 3.87–5.11)
RDW: 12.5 % (ref 11.5–15.5)
WBC: 6.8 10*3/uL (ref 4.0–10.5)
nRBC: 0 % (ref 0.0–0.2)

## 2023-03-24 LAB — LIPASE, BLOOD: Lipase: 39 U/L (ref 11–51)

## 2023-03-24 LAB — URINALYSIS, ROUTINE W REFLEX MICROSCOPIC
Bilirubin Urine: NEGATIVE
Glucose, UA: NEGATIVE mg/dL
Hgb urine dipstick: NEGATIVE
Ketones, ur: NEGATIVE mg/dL
Leukocytes,Ua: NEGATIVE
Nitrite: NEGATIVE
Protein, ur: NEGATIVE mg/dL
Specific Gravity, Urine: 1.023 (ref 1.005–1.030)
pH: 6 (ref 5.0–8.0)

## 2023-03-24 LAB — TROPONIN I (HIGH SENSITIVITY): Troponin I (High Sensitivity): <2 ng/L (ref <18)

## 2023-03-24 NOTE — ED Triage Notes (Signed)
Pt has multiple complaints: Pt reports vaginal itching for a week. Chest pain and headache LLQ pain and n/v, hx of ectopic. Pt denies vaginal bleeding but some white colored discharge.

## 2023-03-25 LAB — WET PREP, GENITAL
Clue Cells Wet Prep HPF POC: NONE SEEN
Sperm: NONE SEEN
Trich, Wet Prep: NONE SEEN
WBC, Wet Prep HPF POC: <10 (ref <10)
Yeast Wet Prep HPF POC: NONE SEEN

## 2023-03-25 LAB — TROPONIN I (HIGH SENSITIVITY): Troponin I (High Sensitivity): <2 ng/L (ref <18)

## 2023-03-25 LAB — PREGNANCY, URINE: Preg Test, Ur: NEGATIVE

## 2023-03-25 NOTE — ED Notes (Signed)
Patient presents ambulatory to room with steady gait. Patient has multiple complaints but is most worried about STD because she had sex with ex and has had itching and burning ever since. Patient a/o x 4 respirations even and non labored

## 2023-03-25 NOTE — ED Provider Notes (Signed)
[place] EMERGENCY DEPARTMENT AT [facility] Provider Note   CSN: [id number] Arrival date & time: 03/24/23  2209     History  Chief Complaint  Patient presents with   Chest Pain   Headache   Vaginal Itching   Abdominal Pain    Amanda Russell is a 27 y.o. female.  27 year old female with past medical history of ectopic pregnancy with removal of her left fallopian tube presents with multiple complaints.  Patient reports primary concern for vaginal itching, states that she has pain in her left lower abdomen from where her fallopian tube was removed as well as chest pain, headache.  Patient had negative STD panel 2 weeks ago but is concerned that her child's father whom she is having intercourse with could give her an STD.  Recently completed Flagyl for BV.       Home Medications Prior to Admission medications   Not on File      Allergies    Pollen extract    Review of Systems   Review of Systems Negative except as per HPI Physical Exam Updated Vital Signs BP 121/83   Pulse (!) 53   Temp 97.9 F (36.6 C) (Oral)   Resp 15   LMP 03/02/2023 (Approximate)   SpO2 100%  Physical Exam Vitals and nursing note reviewed.  Constitutional:      General: She is not in acute distress.    Appearance: She is well-developed. She is not diaphoretic.  HENT:     Head: Normocephalic and atraumatic.  Cardiovascular:     Rate and Rhythm: Normal rate and regular rhythm.     Heart sounds: Normal heart sounds.  Pulmonary:     Effort: Pulmonary effort is normal.     Breath sounds: Normal breath sounds. No wheezing.  Chest:     Chest wall: No tenderness.  Abdominal:     Palpations: Abdomen is soft.     Tenderness: There is no abdominal tenderness.  Musculoskeletal:     Cervical back: Neck supple.  Skin:    General: Skin is warm and dry.  Neurological:     General: No focal deficit present.     Mental Status: She is alert and oriented to person, place, and time.      Cranial Nerves: No cranial nerve deficit.  Psychiatric:        Behavior: Behavior normal.     ED Results / Procedures / Treatments   Labs (all labs ordered are listed, but only abnormal results are displayed) Labs Reviewed  CBC - Abnormal; Notable for the following components:      Result Value   Hemoglobin 11.8 (*)    HCT 34.8 (*)    All other components within normal limits  COMPREHENSIVE METABOLIC PANEL - Abnormal; Notable for the following components:   Calcium 8.6 (*)    All other components within normal limits  URINALYSIS, ROUTINE W REFLEX MICROSCOPIC - Abnormal; Notable for the following components:   APPearance HAZY (*)    All other components within normal limits  WET PREP, GENITAL  LIPASE, BLOOD  PREGNANCY, URINE  I-STAT BETA HCG BLOOD, ED ([facility], [facility], [facility] ONLY)  TROPONIN I (HIGH SENSITIVITY)  TROPONIN I (HIGH SENSITIVITY)    EKG EKG Interpretation  Date/Time:  Sunday March 24 2023 22:29:53 EDT Ventricular Rate:  84 PR Interval:  144 QRS Duration: 74 QT Interval:  376 QTC Calculation: 444 R Axis:   75 Text Interpretation: Normal sinus rhythm Normal ECG When  compared with ECG of 15-Mar-2019 08:58, PREVIOUS ECG IS PRESENT Confirmed by Marily Memos [id number]) on 03/24/2023 11:40:22 PM  Radiology DG Chest 2 View  Result Date: 03/24/2023 CLINICAL DATA:  Chest pain EXAM: CHEST - 2 VIEW COMPARISON:  01/06/2018 FINDINGS: The heart size and mediastinal contours are within normal limits. Both lungs are clear. The visualized skeletal structures are unremarkable. IMPRESSION: No active cardiopulmonary disease. Electronically Signed   By: Minerva Fester M.D.   On: 03/24/2023 22:44    Procedures Procedures    Medications Ordered in ED Medications - No data to display  ED Course/ Medical Decision Making/ A&P                             Medical Decision Making Amount and/or Complexity of Data Reviewed Labs: ordered. Radiology: ordered.   This patient presents to the  ED for primary concern of vaginal itching, this involves an extensive number of treatment options, and is a complaint that carries with it a high risk of complications and morbidity.  The differential diagnosis includes BV, yeast, trich.    Co morbidities that complicate the patient evaluation  As reviewed above and per HPI   Additional history obtained:  External records from outside source obtained and reviewed including multiple medical visits this month including: video visit on March 03, 2023 for vaginal discharge with itching and burning with concern for recurrent oral thrush which was treated 1 month ago with nystatin and feels like it is coming back, she was ultimately prescribed Diflucan as well as oral nystatin advised to follow-up with PCP later that week.   Follow-up visit dated 03/05/2023 with gynecology for Nexplanon removal, note also reports request for STD check with repeat in 3 months due to "spouse with new sexual lifestyle."   Video visit dated 03/07/2023 for report of positive BV at her gynecology visit 2 days earlier requesting treatment, Flagyl was sent to patient's pharmacy.   Video visit dated 03/08/2023 for request of refill of her Flagyl after the bottle went down the bathtub the night previously.   Video visit dated 03/20/2023 with request for Plan B, advised that this cannot be provided over virtual visit and would need in person also note that medication is available over-the-counter.   Video visit dated 03/21/2023 with report of vaginal itching advised to go to urgent care or local health department for STI screening.  Recent labs reviewed including negative hepatitis C, hepatitis B, RPR, HIV, gonorrhea and chlamydia.  These labs were dated 03/05/2023. Labs dated 01/15/2023 including negative RPR, HIV, STI panel including gonorrhea, chlamydia, trichomoniasis, BV, yeast.    Lab Tests:  I Ordered, and personally interpreted labs.  The pertinent results include: Troponin  negative, lipase within normal notes.  CMP without significant findings.  CBC without significant findings.  Urinalysis is unremarkable. Urine pregnancy test is negative. Wet prep shows negative for trichomonas BV, yeast   Imaging Studies ordered:  I ordered imaging studies including chest x-ray I independently visualized and interpreted imaging which showed unremarkable I agree with the radiologist interpretation   Cardiac Monitoring: / EKG:  The patient was maintained on a cardiac monitor.  I personally viewed and interpreted the cardiac monitored which showed an underlying rhythm of: Normal sinus rhythm, rate 84   Problem List / ED Course / Critical interventions / Medication management  27 year old female presents with primary concern for vaginal itching.  Self collect wet prep obtained which shows  negative for trichomoniasis, BV, yeast.  Cardiac workup is reassuring including negative troponin, EKG without ischemic changes, chest x-ray unremarkable, remaining labs are unremarkable.  Neuroexam is unremarkable regarding her concern for headache.  Advised to follow-up with her PCP for recheck if itching persists. I have reviewed the patients home medicines and have made adjustments as needed   Social Determinants of Health:  Has PCP   Test / Admission - Considered:  Stable for discharge         Final Clinical Impression(s) / ED Diagnoses Final diagnoses:  Vaginal itching  Chest pain, unspecified type  Acute nonintractable headache, unspecified headache type    Rx / DC Orders ED Discharge Orders     None         Jeannie Fend, PA-C 03/25/23 0347    Melene Plan, DO 03/25/23 0400

## 2023-08-05 ENCOUNTER — Other Ambulatory Visit: Payer: Self-pay

## 2023-08-05 ENCOUNTER — Inpatient Hospital Stay (HOSPITAL_COMMUNITY)
Admission: AD | Admit: 2023-08-05 | Discharge: 2023-08-06 | Disposition: A | Discharge status: Home / Self Care | Payer: MEDICAID | Attending: Obstetrics and Gynecology | Admitting: Obstetrics and Gynecology

## 2023-08-05 ENCOUNTER — Emergency Department (HOSPITAL_COMMUNITY): Payer: MEDICAID

## 2023-08-05 DIAGNOSIS — N898 Other specified noninflammatory disorders of vagina: Secondary | ICD-10-CM | POA: Diagnosis present

## 2023-08-05 DIAGNOSIS — O26891 Other specified pregnancy related conditions, first trimester: Secondary | ICD-10-CM | POA: Insufficient documentation

## 2023-08-05 DIAGNOSIS — Z113 Encounter for screening for infections with a predominantly sexual mode of transmission: Secondary | ICD-10-CM | POA: Insufficient documentation

## 2023-08-05 DIAGNOSIS — B9689 Other specified bacterial agents as the cause of diseases classified elsewhere: Secondary | ICD-10-CM | POA: Insufficient documentation

## 2023-08-05 DIAGNOSIS — O26899 Other specified pregnancy related conditions, unspecified trimester: Secondary | ICD-10-CM

## 2023-08-05 DIAGNOSIS — O23591 Infection of other part of genital tract in pregnancy, first trimester: Secondary | ICD-10-CM | POA: Diagnosis not present

## 2023-08-05 DIAGNOSIS — O208 Other hemorrhage in early pregnancy: Secondary | ICD-10-CM | POA: Diagnosis not present

## 2023-08-05 DIAGNOSIS — Z59819 Housing instability, housed unspecified: Secondary | ICD-10-CM

## 2023-08-05 DIAGNOSIS — Z202 Contact with and (suspected) exposure to infections with a predominantly sexual mode of transmission: Secondary | ICD-10-CM | POA: Insufficient documentation

## 2023-08-05 DIAGNOSIS — Z3A01 Less than 8 weeks gestation of pregnancy: Secondary | ICD-10-CM | POA: Insufficient documentation

## 2023-08-05 DIAGNOSIS — R109 Unspecified abdominal pain: Secondary | ICD-10-CM | POA: Insufficient documentation

## 2023-08-05 LAB — URINALYSIS, ROUTINE W REFLEX MICROSCOPIC
Bilirubin Urine: NEGATIVE
Glucose, UA: NEGATIVE mg/dL
Hgb urine dipstick: NEGATIVE
Ketones, ur: 20 mg/dL — AB
Leukocytes,Ua: NEGATIVE
Nitrite: NEGATIVE
Protein, ur: NEGATIVE mg/dL
Specific Gravity, Urine: 1.015 (ref 1.005–1.030)
pH: 6 (ref 5.0–8.0)

## 2023-08-05 LAB — PREGNANCY, URINE: Preg Test, Ur: POSITIVE — AB

## 2023-08-05 LAB — HIV ANTIBODY (ROUTINE TESTING W REFLEX): HIV Screen 4th Generation wRfx: NONREACTIVE

## 2023-08-05 NOTE — ED Provider Notes (Signed)
Patient initially screened through the provider in triage system.  Orders were placed, patient has tested positive for pregnancy, last menstrual period beginning of September.  Patient reports a prior history of ectopic pregnancy and has pain and fairly significant tenderness in the right pelvic region.  Patient will be better served being worked up at the MAU for possible ectopic pregnancy.  Discussed with MAU provider, patient to be transferred.   Gilda Crease, MD 08/05/23 2314

## 2023-08-05 NOTE — ED Triage Notes (Signed)
Pt arrived via POV c/o vaginal discharge per pt that looks like chlamydia. Pt also states that her period is late and she thinks she may have a tubal pregnancy Pt would like a pregnancy test.

## 2023-08-05 NOTE — ED Provider Triage Note (Signed)
Emergency Medicine Provider Triage Evaluation Note  Amanda Russell , a 27 y.o. female  was evaluated in triage.  Pt complains of suprapubic pain 2 weeks, vaginal discharge.  Review of Systems  Positive: Vaginal discharge, pelvic pain, dysuria Negative: Spotting, weakness, NVD  Physical Exam  BP 131/86   Pulse 92   Temp 98.6 F (37 C) (Oral)   Resp 16   Ht 5\' 5"  (1.651 m)   Wt 62.4 kg   LMP 06/02/2023 (Approximate)   SpO2 100%   BMI 22.89 kg/m  Gen:   Awake, no distress   Resp:  Normal effort  MSK:   Moves extremities without difficulty  Other:    Medical Decision Making  Medically screening exam initiated at 9:18 PM.  Appropriate orders placed.  Amanda Russell was informed that the remainder of the evaluation will be completed by another provider, this initial triage assessment does not replace that evaluation, and the importance of remaining in the ED until their evaluation is complete.  Has history of ectopic pregnancy.  2 weeks of discharge, pain and dysuria. Patient does not know if she is pregnant, concerned about STD versus UTI. Overall well appearing, mild pain on exam. Ordering labs while waiting further workup. Patient agrees.    Amanda Russell, Amanda Russell 08/05/23 2121

## 2023-08-06 ENCOUNTER — Encounter (HOSPITAL_COMMUNITY): Payer: Self-pay

## 2023-08-06 DIAGNOSIS — Z3A01 Less than 8 weeks gestation of pregnancy: Secondary | ICD-10-CM | POA: Diagnosis not present

## 2023-08-06 DIAGNOSIS — R109 Unspecified abdominal pain: Secondary | ICD-10-CM | POA: Diagnosis not present

## 2023-08-06 DIAGNOSIS — O26891 Other specified pregnancy related conditions, first trimester: Secondary | ICD-10-CM | POA: Diagnosis not present

## 2023-08-06 LAB — COMPREHENSIVE METABOLIC PANEL
ALT: 21 U/L (ref 0–44)
AST: 16 U/L (ref 15–41)
Albumin: 4.1 g/dL (ref 3.5–5.0)
Alkaline Phosphatase: 60 U/L (ref 38–126)
Anion gap: 11 (ref 5–15)
BUN: 6 mg/dL (ref 6–20)
CO2: 24 mmol/L (ref 22–32)
Calcium: 9.1 mg/dL (ref 8.9–10.3)
Chloride: 103 mmol/L (ref 98–111)
Creatinine, Ser: 0.66 mg/dL (ref 0.44–1.00)
GFR, Estimated: >60 mL/min (ref >60)
Glucose, Bld: 95 mg/dL (ref 70–99)
Potassium: 3 mmol/L — ABNORMAL LOW (ref 3.5–5.1)
Sodium: 138 mmol/L (ref 135–145)
Total Bilirubin: 1 mg/dL (ref <1.2)
Total Protein: 7.1 g/dL (ref 6.5–8.1)

## 2023-08-06 LAB — WET PREP, GENITAL
Sperm: NONE SEEN
Trich, Wet Prep: NONE SEEN
WBC, Wet Prep HPF POC: >=10 — AB (ref <10)
Yeast Wet Prep HPF POC: NONE SEEN

## 2023-08-06 LAB — GC/CHLAMYDIA PROBE AMP (~~LOC~~) NOT AT ARMC
Chlamydia: NEGATIVE
Comment: NEGATIVE
Comment: NORMAL
Neisseria Gonorrhea: NEGATIVE

## 2023-08-06 LAB — CBC
HCT: 33.8 % — ABNORMAL LOW (ref 36.0–46.0)
Hemoglobin: 11.9 g/dL — ABNORMAL LOW (ref 12.0–15.0)
MCH: 28.8 pg (ref 26.0–34.0)
MCHC: 35.2 g/dL (ref 30.0–36.0)
MCV: 81.8 fL (ref 80.0–100.0)
Platelets: 224 10*3/uL (ref 150–400)
RBC: 4.13 MIL/uL (ref 3.87–5.11)
RDW: 11.6 % (ref 11.5–15.5)
WBC: 8.3 10*3/uL (ref 4.0–10.5)
nRBC: 0 % (ref 0.0–0.2)

## 2023-08-06 LAB — HCG, QUANTITATIVE, PREGNANCY: hCG, Beta Chain, Quant, S: 11703 m[IU]/mL — ABNORMAL HIGH (ref <5)

## 2023-08-06 LAB — RPR: RPR Ser Ql: NONREACTIVE

## 2023-08-06 MED ORDER — METRONIDAZOLE 500 MG PO TABS: 500.0000 mg | ORAL_TABLET | Freq: Two times a day (BID) | ORAL | 0 refills | Status: AC

## 2023-08-06 NOTE — ED Notes (Signed)
ER provider wants patient to MAU, called MAU and informed them. There was a miscommunication and lapse of knowledge of protocol by the provider in triage at the time and she waited here for her work up.   Patient was taken to [place] before this message was relayed to me as I was up on the ICU with a patient.  The charge nurse was made aware of this and MAU was kind enough to still accept her despite most the work up being completed in triage.   She has just returned from [place] and transport was called to bring her to the MAU.  Patient is alert, oriented, and vitals are stable.

## 2023-08-06 NOTE — MAU Note (Addendum)
.  Amanda Russell is a 27 y.o. at [redacted]w[redacted]d here in MAU reporting: here from main ED c/o abdominal cramping that is coming and going since earlier today. Denies VB or abnormal discharge.  LMP: 06/10/2023 Onset of complaint:  Pain score: 8 Vitals:   08/06/23 0012 08/06/23 0040  BP: 127/78 125/82  Pulse: 69 75  Resp: 17 16  Temp: 98.6 F (37 C) 98.3 F (36.8 C)  SpO2: 100% 100%     FHT:NA  Lab orders placed from triage:  none

## 2023-08-06 NOTE — ED Notes (Signed)
Sent down purple, green, and red top for patient prior to MAU transport.

## 2023-08-06 NOTE — MAU Provider Note (Addendum)
Chief Complaint: Vaginal Discharge, Possible Pregnancy, Exposure to STD, and Abdominal Pain    SUBJECTIVE HPI: Amanda Russell is a 27 y.o. [id number] at [redacted]w[redacted]d by LMP who presents to maternity admissions reporting as a transfer from the main ED where she presented with abdominal cramping and was evaluated for ectopic pregnancy but felt to be better served here in MAU and thus transferred over.   Here patient  declined cramping or bleeding. She requests to make a police report because she does not feel safe to go to her car to get home. Denies SI/HI, hearing voices other than mine, seeing people other than me. States she is living in [place] alone while she has a pending court case for custody of one of her children. Her son is currently staying with her aunt, who she can also stay with. She would like to speak the police, about the FOB. She feels that "people are following her" and she does not "even feel safe getting to her car".   She denies vaginal bleeding, vaginal itching/burning, urinary symptoms, h/a, dizziness, n/v, or fever/chills.     HPI  Past Medical History:  Diagnosis Date   ADHD (attention deficit hyperactivity disorder), combined type 07/14/2012   Carrier of group B Streptococcus 03/05/2023   + in urine   Chlamydia    Chlamydial infection 03/05/2023   History of preterm premature rupture of membranes 03/05/2023   32w. Per MFM, nightly progesterone   Intestinal infection by trichomonas vaginalis 03/05/2023   Pregnancy 03/28/2020   Preterm premature rupture of membranes (PPROM) with unknown onset of labor 07/13/2014   Suicidal ideation    Past Surgical History:  Procedure Laterality Date   LAPAROSCOPY Left 10/16/2019   Procedure: LAPAROSCOPIC LEFT SALPINGECTOMY;  Surgeon: Ripley Bing, MD;  Location: [facility];  Service: Gynecology;  Laterality: Left;   NO PAST SURGERIES     Social History   Socioeconomic History   Marital status: Single    Spouse name: Not on file    Number of children: Not on file   Years of education: Not on file   Highest education level: Not on file  Occupational History   Occupation: Student    Comment: 10th grade at [employer]  Tobacco Use   Smoking status: Former    Current packs/day: 0.00    Average packs/day: 0.5 packs/day for 0.8 years (0.4 ttl pk-yrs)    Types: Cigarettes    Start date: 03/21/2010    Quit date: 01/07/2011    Years since quitting: 12.5   Smokeless tobacco: Never  Vaping Use   Vaping status: Some Days  Substance and Sexual Activity   Alcohol use: Not Currently    Comment: socially   Drug use: Yes    Frequency: 4.0 times per week    Types: Marijuana   Sexual activity: Yes    Partners: Male    Birth control/protection: Implant  Other Topics Concern   Not on file  Social History Narrative   Not on file   Social Determinants of Health   Financial Resource Strain: Not on file  Food Insecurity: Not on file  Transportation Needs: Not on file  Physical Activity: Not on file  Stress: Not on file  Social Connections: Not on file  Intimate Partner Violence: Not on file   No current facility-administered medications on file prior to encounter.   No current outpatient medications on file prior to encounter.   Allergies  Allergen Reactions   Pollen Extract Itching  and Cough    I have reviewed patient's Past Medical Hx, Surgical Hx, Family Hx, Social Hx, medications and allergies.   ROS:  Review of Systems Review of Systems  Other systems negative   Physical Exam  Physical Exam Patient Vitals for the past 24 hrs:  BP Temp Temp src Pulse Resp SpO2 Height Weight  08/06/23 0040 125/82 98.3 F (36.8 C) Oral 75 16 100 % -- --  08/06/23 0012 127/78 98.6 F (37 C) Oral 69 17 100 % -- --  08/05/23 2056 -- -- -- -- -- -- 5\' 5"  (1.651 m) 62.4 kg  08/05/23 2045 131/86 -- -- 92 16 100 % -- --  08/05/23 2044 -- 98.6 F (37 C) Oral -- -- -- -- --   Constitutional: Well-developed, well-nourished  female in no acute distress.  Cardiovascular: normal rate Respiratory: normal effort GI: Abd soft, non-tender. Pos BS x 4 MS: Extremities nontender, no edema, normal ROM Neurologic: Alert and oriented x 4.  GU: Neg CVAT.  LAB RESULTS Results for orders placed or performed during the hospital encounter of 08/05/23 (from the past 24 hour(s))  Urinalysis, Routine w reflex microscopic -Urine, Clean Catch     Status: Abnormal   Collection Time: 08/05/23  9:17 PM  Result Value Ref Range   Color, Urine YELLOW YELLOW   APPearance HAZY (A) CLEAR   Specific Gravity, Urine 1.015 1.005 - 1.030   pH 6.0 5.0 - 8.0   Glucose, UA NEGATIVE NEGATIVE mg/dL   Hgb urine dipstick NEGATIVE NEGATIVE   Bilirubin Urine NEGATIVE NEGATIVE   Ketones, ur 20 (A) NEGATIVE mg/dL   Protein, ur NEGATIVE NEGATIVE mg/dL   Nitrite NEGATIVE NEGATIVE   Leukocytes,Ua NEGATIVE NEGATIVE  Pregnancy, urine     Status: Abnormal   Collection Time: 08/05/23  9:17 PM  Result Value Ref Range   Preg Test, Ur POSITIVE (A) NEGATIVE  HIV Antibody (routine testing w rflx)     Status: None   Collection Time: 08/05/23  9:24 PM  Result Value Ref Range   HIV Screen 4th Generation wRfx Non Reactive Non Reactive  hCG, quantitative, pregnancy     Status: Abnormal   Collection Time: 08/06/23 12:20 AM  Result Value Ref Range   hCG, Beta Chain, Quant, S 11,703 (H) <5 mIU/mL  CBC     Status: Abnormal   Collection Time: 08/06/23 12:20 AM  Result Value Ref Range   WBC 8.3 4.0 - 10.5 K/uL   RBC 4.13 3.87 - 5.11 MIL/uL   Hemoglobin 11.9 (L) 12.0 - 15.0 g/dL   HCT 40.9 (L) 81.1 - 91.4 %   MCV 81.8 80.0 - 100.0 fL   MCH 28.8 26.0 - 34.0 pg   MCHC 35.2 30.0 - 36.0 g/dL   RDW 78.2 95.6 - 21.3 %   Platelets 224 150 - 400 K/uL   nRBC 0.0 0.0 - 0.2 %  Comprehensive metabolic panel     Status: Abnormal   Collection Time: 08/06/23 12:20 AM  Result Value Ref Range   Sodium 138 135 - 145 mmol/L   Potassium 3.0 (L) 3.5 - 5.1 mmol/L   Chloride  103 98 - 111 mmol/L   CO2 24 22 - 32 mmol/L   Glucose, Bld 95 70 - 99 mg/dL   BUN 6 6 - 20 mg/dL   Creatinine, Ser 0.86 0.44 - 1.00 mg/dL   Calcium 9.1 8.9 - 57.8 mg/dL   Total Protein 7.1 6.5 - 8.1 g/dL   Albumin 4.1 3.5 -  5.0 g/dL   AST 16 15 - 41 U/L   ALT 21 0 - 44 U/L   Alkaline Phosphatase 60 38 - 126 U/L   Total Bilirubin 1.0 <1.2 mg/dL   GFR, Estimated >40 >98 mL/min   Anion gap 11 5 - 15  Wet prep, genital     Status: Abnormal   Collection Time: 08/06/23 12:57 AM   Specimen: PATH Cytology Urine  Result Value Ref Range   Yeast Wet Prep HPF POC NONE SEEN NONE SEEN   Trich, Wet Prep NONE SEEN NONE SEEN   Clue Cells Wet Prep HPF POC PRESENT (A) NONE SEEN   WBC, Wet Prep HPF POC >=10 (A) <10   Sperm NONE SEEN        IMAGING US OB Transvaginal  Result Date: 08/06/2023 CLINICAL DATA:  Right pelvic pain EXAM: TRANSVAGINAL OB ULTRASOUND TECHNIQUE: Transvaginal ultrasound was performed for complete evaluation of the gestation as well as the maternal uterus, adnexal regions, and pelvic cul-de-sac. COMPARISON:  None Available. FINDINGS: Intrauterine gestational sac: Single Yolk sac:  Visualized. Embryo:  Not Visualized. Cardiac Activity: Not Visualized. Heart Rate:  bpm MSD: 12 mm   6 w   0 d CRL:     mm    w  d                  [place] EDC: Subchorionic hemorrhage:  Small subchorionic hemorrhage. Maternal uterus/adnexae: No adnexal mass or free fluid. IMPRESSION: Early intrauterine gestational sac with yolk sac but no fetal pole. Six weeks by mean sac diameter. This could be followed with repeat ultrasound in 10-14 days to ensure expected progression. Small subchorionic hemorrhage. Electronically Signed   By: Charlett Nose M.D.   On: 08/06/2023 00:28    MAU Management/MDM: I have reviewed the triage vital signs and the nursing notes.   Pertinent labs & imaging results that were available during my care of the patient were reviewed by me and considered in my medical decision making (see  chart for details).      I have reviewed her medical records including past results, notes and treatments. Medical, Surgical, and family history were reviewed.  Medications and recent lab tests were reviewed  Ordered usual first trimester r/o ectopic labs.   Pelvic exam and cultures done Ultrasound to rule out ectopic performed in ED - gestational sac and yolk sac present on [place].   Treatments in MAU included GC/chlamydia swab, wet prep swab.   This bleeding/pain can represent a normal pregnancy with bleeding, spontaneous abortion or even an ectopic which can be life-threatening.  The process as listed above helps to determine which of these is present.   ASSESSMENT 1. [redacted] weeks gestation of pregnancy   2. Cramping affecting pregnancy, antepartum   3. Bacterial vaginosis   Will treat BV with metronidazole cream x 5 days.   Recommend follow up viability [place] in 2 weeks.   Police called, patient discussed her concerns and has declined pressing any charges against anyone at this time.   OB provider list provided; patient likely to go stay with family in [place] per her.  She is calling friend/family for a safe ride home.   PLAN Discharge home Will repeat Ultrasound in 2 weeks Ectopic precautions provided   Follow-up Information     [facility] Follow up.   Specialty: Obstetrics and Gynecology Why: As needed for pregnancy emergencies Contact information: [address] [place] [address] [phone]  Pt stable at time of discharge. Encouraged to return here if she develops worsening of symptoms, increase in pain, fever, or other concerning symptoms.    Wyn Forster, MD FMOB Fellow, Faculty practice [facility], Center for [facility]  08/06/2023  2:27 AM

## 2023-08-31 ENCOUNTER — Inpatient Hospital Stay (HOSPITAL_COMMUNITY): Payer: MEDICAID

## 2023-08-31 ENCOUNTER — Inpatient Hospital Stay (HOSPITAL_COMMUNITY)
Admission: AD | Admit: 2023-08-31 | Discharge: 2023-09-01 | Disposition: A | Discharge status: Home / Self Care | Payer: MEDICAID | Attending: Obstetrics & Gynecology | Admitting: Obstetrics & Gynecology

## 2023-08-31 ENCOUNTER — Other Ambulatory Visit: Payer: Self-pay

## 2023-08-31 DIAGNOSIS — O418X10 Other specified disorders of amniotic fluid and membranes, first trimester, not applicable or unspecified: Secondary | ICD-10-CM | POA: Diagnosis not present

## 2023-08-31 DIAGNOSIS — O208 Other hemorrhage in early pregnancy: Secondary | ICD-10-CM | POA: Insufficient documentation

## 2023-08-31 DIAGNOSIS — Z3A09 9 weeks gestation of pregnancy: Secondary | ICD-10-CM | POA: Insufficient documentation

## 2023-08-31 DIAGNOSIS — O26891 Other specified pregnancy related conditions, first trimester: Secondary | ICD-10-CM | POA: Insufficient documentation

## 2023-08-31 DIAGNOSIS — R102 Pelvic and perineal pain: Secondary | ICD-10-CM | POA: Diagnosis not present

## 2023-08-31 DIAGNOSIS — O468X1 Other antepartum hemorrhage, first trimester: Secondary | ICD-10-CM | POA: Diagnosis not present

## 2023-08-31 LAB — CBC
HCT: 32.1 % — ABNORMAL LOW (ref 36.0–46.0)
Hemoglobin: 11.4 g/dL — ABNORMAL LOW (ref 12.0–15.0)
MCH: 29.5 pg (ref 26.0–34.0)
MCHC: 35.5 g/dL (ref 30.0–36.0)
MCV: 82.9 fL (ref 80.0–100.0)
Platelets: 234 10*3/uL (ref 150–400)
RBC: 3.87 MIL/uL (ref 3.87–5.11)
RDW: 12.2 % (ref 11.5–15.5)
WBC: 8.6 10*3/uL (ref 4.0–10.5)
nRBC: 0 % (ref 0.0–0.2)

## 2023-08-31 LAB — URINALYSIS, ROUTINE W REFLEX MICROSCOPIC
Bilirubin Urine: NEGATIVE
Glucose, UA: NEGATIVE mg/dL
Hgb urine dipstick: NEGATIVE
Ketones, ur: NEGATIVE mg/dL
Leukocytes,Ua: NEGATIVE
Nitrite: NEGATIVE
Protein, ur: NEGATIVE mg/dL
Specific Gravity, Urine: 1.018 (ref 1.005–1.030)
pH: 6 (ref 5.0–8.0)

## 2023-08-31 LAB — WET PREP, GENITAL
Clue Cells Wet Prep HPF POC: NONE SEEN
Sperm: NONE SEEN
Trich, Wet Prep: NONE SEEN
WBC, Wet Prep HPF POC: <10 (ref <10)
Yeast Wet Prep HPF POC: NONE SEEN

## 2023-08-31 NOTE — MAU Note (Signed)
.  Amanda Russell is a 27 y.o. at [redacted]w[redacted]d here in MAU reporting: ongoing lower right sided abdominal pain - intermittent sharp/cramping. Reports Forero spotting when wiping but denies VB or abnormal discharge.   Onset of complaint: 1 week  Pain score: 9 Vitals:   08/31/23 2220  BP: 116/88  Pulse: 72  Resp: 17  Temp: 98.6 F (37 C)  SpO2: 99%     FHT:NA Lab orders placed from triage:  UA

## 2023-09-01 DIAGNOSIS — Z3A09 9 weeks gestation of pregnancy: Secondary | ICD-10-CM

## 2023-09-01 DIAGNOSIS — O468X1 Other antepartum hemorrhage, first trimester: Secondary | ICD-10-CM

## 2023-09-01 DIAGNOSIS — O418X10 Other specified disorders of amniotic fluid and membranes, first trimester, not applicable or unspecified: Secondary | ICD-10-CM

## 2023-09-01 LAB — HCG, QUANTITATIVE, PREGNANCY: hCG, Beta Chain, Quant, S: 77995 m[IU]/mL — ABNORMAL HIGH (ref <5)

## 2023-09-01 MED ORDER — DOXYLAMINE-PYRIDOXINE 10-10 MG PO TBEC: 2.0000 | DELAYED_RELEASE_TABLET | Freq: Every day | ORAL | 0 refills | Status: DC

## 2023-09-01 NOTE — Discharge Instructions (Signed)
 FOR NAUSEA AND VOMITING: You can buy Unisom (Doxylamine) 25mg  tablets and Vitamin B6 (usually sold in 100mg  tablets) daily -- take half a tablet of Unisom (12.5mg  dose) and a tablet of Vitamin B6 every night. This would be in place of Diclegis if Diclegis is not covered by your insurance.  **The Unisom may make you drowsy! Please do not take this if you are going to be operating heavy machinery**    Safe Medications in Pregnancy   Acne: Benzoyl Peroxide Salicylic Acid  Backache/Headache: Tylenol: 2 regular strength every 4 hours OR              2 Extra strength every 6 hours  Colds/Coughs/Allergies: Benadryl (alcohol free) 25 mg every 6 hours as needed Breath right strips Claritin Cepacol throat lozenges Chloraseptic throat spray Cold-Eeze- up to three times per day Cough drops, alcohol free Flonase (by prescription only) Guaifenesin Mucinex Robitussin DM (plain only, alcohol free) Saline nasal spray/drops Sudafed (pseudoephedrine) & Actifed ** use only after [redacted] weeks gestation and if you do not have high blood pressure Tylenol Vicks Vaporub Zinc lozenges Zyrtec   Constipation: Colace Ducolax suppositories Fleet enema Glycerin suppositories Metamucil Milk of magnesia Miralax Senokot Smooth move tea  Diarrhea: Kaopectate Imodium A-D  *NO pepto Bismol  Hemorrhoids: Anusol Anusol HC Preparation H Tucks  Indigestion: Tums Maalox Mylanta Cimetidine (Tagamet HB)** preferred in pregnancy Famotidine (Pepcid) Ranitidine (Zantac)  Insomnia: Benadryl (alcohol free) 25mg  every 6 hours as needed Tylenol PM Unisom, no Gelcaps  Leg Cramps: Tums MagGel  Nausea/Vomiting:  Bonine Dramamine Emetrol Ginger extract Sea bands Meclizine   Nausea medication to take during pregnancy:  Unisom (doxylamine succinate 25 mg tablets) Take one tablet daily at bedtime. If symptoms are not adequately controlled, the dose can be increased to a maximum recommended dose  of two tablets daily (1/2 tablet in the morning, 1/2 tablet mid-afternoon and one at bedtime). Vitamin B6 100mg  tablets. Take one tablet twice a day (up to 200 mg per day).  Skin Rashes: Aveeno products Benadryl cream or 25mg  every 6 hours as needed Calamine Lotion 1% cortisone cream  Yeast infection: Gyne-lotrimin 7 Monistat 7   **If taking multiple medications, please check labels to avoid duplicating the same active ingredients **take medication as directed on the label ** Do not exceed 4000 mg of tylenol in 24 hours **Do not take medications that contain aspirin or ibuprofen

## 2023-09-01 NOTE — MAU Provider Note (Signed)
History     CSN: [id number]  Arrival date and time: 08/31/23 2202   Event Date/Time   First Provider Initiated Contact with Patient    Chief Complaint  Patient presents with   Abdominal Pain    HPI  Amanda Russell is a 27 y.o. [id number] at [redacted]w[redacted]d who presents to the MAU for vaginal discharge and bleeding. Pt reports light Aro vaginal discharge, ongoing since starting on Flagyl a few days ago, which she was Rx'ed during her last MAU visit here on 11/5. She denies any bright red discharge, but has consistently had light Guercio discharge -- no odor to it. No recent intercourse. She also reports ongoing pelvic cramping, worse w lying down, better when up and moving around. Endorses mild nausea, occ headaches. No fevers, chills, lightheadedness, c/d. She is excited about this pregnancy and wants to make sure everything is ok.  Past Medical History:  Diagnosis Date   ADHD (attention deficit hyperactivity disorder), combined type 07/14/2012   Carrier of group B Streptococcus 03/05/2023   + in urine   Chlamydia    Chlamydial infection 03/05/2023   History of preterm premature rupture of membranes 03/05/2023   32w. Per MFM, nightly progesterone   Intestinal infection by trichomonas vaginalis 03/05/2023   Pregnancy 03/28/2020   Preterm premature rupture of membranes (PPROM) with unknown onset of labor 07/13/2014   Suicidal ideation     Past Surgical History:  Procedure Laterality Date   LAPAROSCOPY Left 10/16/2019   Procedure: LAPAROSCOPIC LEFT SALPINGECTOMY;  Surgeon: [place] Bing, MD;  Location: [facility];  Service: Gynecology;  Laterality: Left;   NO PAST SURGERIES      Family History  Problem Relation Age of Onset   Hypertension Other    Cancer Neg Hx    Diabetes Neg Hx     Social History   Tobacco Use   Smoking status: Former    Current packs/day: 0.00    Average packs/day: 0.5 packs/day for 0.8 years (0.4 ttl pk-yrs)    Types: Cigarettes    Start date: 03/21/2010     Quit date: 01/07/2011    Years since quitting: 12.6   Smokeless tobacco: Never  Vaping Use   Vaping status: Some Days  Substance Use Topics   Alcohol use: Not Currently    Comment: socially   Drug use: Yes    Frequency: 4.0 times per week    Types: Marijuana    Allergies:  Allergies  Allergen Reactions   Pollen Extract Itching and Cough    No medications prior to admission.    ROS reviewed and pertinent positives and negatives as documented in HPI.  Physical Exam   Blood pressure 116/88, pulse 72, temperature 98.6 F (37 C), resp. rate 17, height 5\' 5"  (1.651 m), weight 68.8 kg, last menstrual period 06/02/2023, SpO2 99%, not currently breastfeeding.  Physical Exam Constitutional:      General: She is not in acute distress.    Appearance: Normal appearance. She is not ill-appearing.  HENT:     Head: Normocephalic and atraumatic.  Cardiovascular:     Rate and Rhythm: Normal rate.  Pulmonary:     Effort: Pulmonary effort is normal.     Breath sounds: Normal breath sounds.  Abdominal:     Palpations: Abdomen is soft.     Tenderness: There is no abdominal tenderness. There is no guarding.  Musculoskeletal:        General: Normal range of motion.  Skin:    General: Skin  is warm and dry.     Findings: No rash.  Neurological:     General: No focal deficit present.     Mental Status: She is alert and oriented to person, place, and time.     MAU Course  Procedures  MDM 27 y.o. [id number] at [redacted]w[redacted]d presenting for vaginal bleeding/discharge in early pregnancy. Ongoing, suspect for weeks, no bright red discharge or passage of clots. Pt is well appearing, exam is benign. On labs, she is Rh positive, HgB is ok, wet prep is negative, UA negative. U/S with SIUP with large subchorionic hemorrhage. Results discussed with patient. Recommend pelvic rest. Given list of meds safe in pregnancy. Return precautions discussed. Stable for d/c.   Assessment and Plan  Subchorionic hematoma in  first trimester, single or unspecified fetus - Plan: Discharge patient Pelvic rest Return precautions given Stable for d/c  Sundra Aland, MD OB Fellow, Faculty Practice [facility], [facility]  09/01/2023, 12:46 AM

## 2023-09-02 LAB — GC/CHLAMYDIA PROBE AMP (~~LOC~~) NOT AT ARMC
Chlamydia: NEGATIVE
Comment: NEGATIVE
Comment: NORMAL
Neisseria Gonorrhea: NEGATIVE

## 2023-09-16 ENCOUNTER — Other Ambulatory Visit: Payer: Self-pay

## 2023-09-16 ENCOUNTER — Inpatient Hospital Stay (HOSPITAL_COMMUNITY)
Admission: AD | Admit: 2023-09-16 | Discharge: 2023-09-16 | Disposition: A | Discharge status: Home / Self Care | Payer: Medicaid Other | Attending: Obstetrics & Gynecology | Admitting: Obstetrics & Gynecology

## 2023-09-16 DIAGNOSIS — Z3A11 11 weeks gestation of pregnancy: Secondary | ICD-10-CM | POA: Diagnosis not present

## 2023-09-16 DIAGNOSIS — M549 Dorsalgia, unspecified: Secondary | ICD-10-CM | POA: Insufficient documentation

## 2023-09-16 DIAGNOSIS — O26891 Other specified pregnancy related conditions, first trimester: Secondary | ICD-10-CM | POA: Diagnosis not present

## 2023-09-16 DIAGNOSIS — M545 Low back pain, unspecified: Secondary | ICD-10-CM

## 2023-09-16 LAB — URINALYSIS, ROUTINE W REFLEX MICROSCOPIC
Bilirubin Urine: NEGATIVE
Glucose, UA: NEGATIVE mg/dL
Hgb urine dipstick: NEGATIVE
Ketones, ur: 80 mg/dL — AB
Leukocytes,Ua: NEGATIVE
Nitrite: NEGATIVE
Protein, ur: NEGATIVE mg/dL
Specific Gravity, Urine: 1.018 (ref 1.005–1.030)
pH: 6 (ref 5.0–8.0)
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
## 2023-10-02 NOTE — L&D Delivery Note (Signed)
 OB/GYN Faculty Practice Delivery Note  Amanda Russell is a 28 y.o. [id number] s/p SVD at [redacted]w[redacted]d. She was admitted for SOL.   ROM: rupture date, rupture time, delivery date, or delivery time have not been documented with clear fluid GBS Status: unknown   Maximum Maternal Temperature: 97.71F  Labor Progress: Initial SVE: She was admitted complete and pushing.   Delivery Date/Time: [phone] 5/21 Delivery: Called to room and patient was complete and pushing. Head delivered LOA with compound hand. No nuchal cord present. Shoulder and body delivered in usual fashion. Infant with spontaneous cry, placed on mother's abdomen, dried and stimulated. Cord clamped x 2 after 1-minute delay, and cut by mother. Cord blood drawn. Placenta delivered spontaneously with gentle cord traction. Fundus firm with massage and Pitocin . Labia, perineum, vagina, and cervix inspected without laceration. Baby to NICU, mom doing well  Baby Weight: pending  Placenta: 3 vessel, intact. Sent to L&Dpathology Complications: None Lacerations: None EBL: 108 mL Analgesia: n/a    Infant:  APGAR (1 MIN):  8 APGAR (5 MINS):  9  Ebony Goldstein, MD [facility] Family Medicine Fellow, [facility] for [facility], [facility] 02/19/2024, 7:53 AM

## 2023-10-17 ENCOUNTER — Ambulatory Visit (HOSPITAL_COMMUNITY)
Admission: EM | Admit: 2023-10-17 | Discharge: 2023-10-17 | Disposition: A | Discharge status: Home / Self Care | Payer: Medicaid Other | Attending: Emergency Medicine | Admitting: Emergency Medicine

## 2023-10-17 ENCOUNTER — Encounter (HOSPITAL_COMMUNITY): Payer: Self-pay

## 2023-10-17 DIAGNOSIS — N6082 Other benign mammary dysplasias of left breast: Secondary | ICD-10-CM

## 2023-10-17 MED ORDER — AMOXICILLIN-POT CLAVULANATE 875-125 MG PO TABS: 1.0000 | ORAL_TABLET | Freq: Two times a day (BID) | ORAL | 0 refills | Status: DC

## 2023-10-17 MED ORDER — LIDOCAINE-EPINEPHRINE-TETRACAINE (LET) TOPICAL GEL
TOPICAL | Status: AC
  Filled 2023-10-17: qty 3

## 2023-10-17 NOTE — ED Provider Notes (Signed)
 [facility]    CSN: [id number] Arrival date & time: 10/17/23  1837      History   Chief Complaint Chief Complaint  Patient presents with   Abscess    HPI Amanda Russell is a 28 y.o. female.   Patient is reporting abscess on the left breast next to the nipple.  She has had this bump there before that has come and gone.  She has not received any treatment for it.  She is [redacted] weeks pregnant  The history is provided by the patient.  Abscess Abscess location: left breast 1o'clock position adjacent to nipple. Abscess quality: fluctuance, itching, painful and redness   Red streaking: no   Duration:  3 days Progression:  Worsening Pain details:    Quality:  Pressure and aching   Severity:  Moderate   Duration:  1 day   Timing:  Constant   Progression:  Worsening Chronicity:  New Associated symptoms: no fever     Past Medical History:  Diagnosis Date   ADHD (attention deficit hyperactivity disorder), combined type 07/14/2012   Carrier of group B Streptococcus 03/05/2023   + in urine   Chlamydia    Chlamydial infection 03/05/2023   History of preterm premature rupture of membranes 03/05/2023   32w. Per MFM, nightly progesterone   Intestinal infection by trichomonas vaginalis 03/05/2023   Pregnancy 03/28/2020   Preterm premature rupture of membranes (PPROM) with unknown onset of labor 07/13/2014   Suicidal ideation     Patient Active Problem List   Diagnosis Date Noted   Subchorionic hematoma in first trimester 09/01/2023   Pregnant 09/09/2021   Preterm premature rupture of membranes 09/09/2021   Left tubal pregnancy 10/16/2019    Past Surgical History:  Procedure Laterality Date   LAPAROSCOPY Left 10/16/2019   Procedure: LAPAROSCOPIC LEFT SALPINGECTOMY;  Surgeon: [place] Bing, MD;  Location: [facility];  Service: Gynecology;  Laterality: Left;   NO PAST SURGERIES      OB History     Gravida  6   Para  4   Term  2   Preterm  2   AB  1    Living  4      SAB      IAB      Ectopic  1   Multiple  0   Live Births  4            Home Medications    Prior to Admission medications   Medication Sig Start Date End Date Taking? Authorizing Provider  amoxicillin-clavulanate (AUGMENTIN) 875-125 MG tablet Take 1 tablet by mouth every 12 (twelve) hours. 10/17/23  Yes Cylah Fannin, Linde Gillis, NP    Family History Family History  Problem Relation Age of Onset   Hypertension Other    Cancer Neg Hx    Diabetes Neg Hx     Social History Social History   Tobacco Use   Smoking status: Former    Current packs/day: 0.00    Average packs/day: 0.5 packs/day for 0.8 years (0.4 ttl pk-yrs)    Types: Cigarettes    Start date: 03/21/2010    Quit date: 01/07/2011    Years since quitting: 12.7   Smokeless tobacco: Never  Vaping Use   Vaping status: Some Days  Substance Use Topics   Alcohol use: Not Currently    Comment: socially   Drug use: Yes    Frequency: 4.0 times per week    Types: Marijuana     Allergies  Pollen extract   Review of Systems Review of Systems  Constitutional:  Negative for fever.  Skin:  Positive for wound.       Abscess left breast  All other systems reviewed and are negative.    Physical Exam Triage Vital Signs ED Triage Vitals  Encounter Vitals Group     BP 10/17/23 1914 (!) 103/58     Systolic BP Percentile --      Diastolic BP Percentile --      Pulse Rate 10/17/23 1914 83     Resp 10/17/23 1914 18     Temp 10/17/23 1914 98.4 F (36.9 C)     Temp Source 10/17/23 1914 Oral     SpO2 10/17/23 1914 98 %     Weight --      Height --      Head Circumference --      Peak Flow --      Pain Score 10/17/23 1915 10     Pain Loc --      Pain Education --      Exclude from Growth Chart --    No data found.  Updated Vital Signs BP (!) 103/58 (BP Location: Left Arm)   Pulse 83   Temp 98.4 F (36.9 C) (Oral)   Resp 18   LMP 06/02/2023 (Approximate)   SpO2 98%   Visual  Acuity Right Eye Distance:   Left Eye Distance:   Bilateral Distance:    Right Eye Near:   Left Eye Near:    Bilateral Near:     Physical Exam Vitals and nursing note reviewed.  Constitutional:      Appearance: Normal appearance.  Skin:    Comments: Left breast abscess at the 1 o'clock position adjacent to nipple.  Measuring 1 inch x 1 inch.  Center is a blackhead.  Surrounding area is red center and is fluctuant.    Neurological:     Mental Status: She is alert.      UC Treatments / Results  Labs (all labs ordered are listed, but only abnormal results are displayed) Labs Reviewed - No data to display  EKG   Radiology No results found.  Procedures Procedures (including critical care time)  Medications Ordered in UC Medications - No data to display  Initial Impression / Assessment and Plan / UC Course  I have reviewed the triage vital signs and the nursing notes.  Pertinent labs & imaging results that were available during my care of the patient were reviewed by me and considered in my medical decision making (see chart for details).   Patient has a sebaceous cyst that has become inflammed, swollen.  This area is reported to have come and gone but started swelling and is now tender and hot. The area is numbed using topical LET Gel.  A 18 gauge needle is use to open center area approximately 2mm and a solidified material is removed at which time purulent material is expressed.  Fibrous cystic sack is also removed at this time.  Patient tolerated well.   Wound care information is reviewed.  Antibiotic information is reviewed.  Patient to follow up with PCP for further needs or return to UC PRN   Final Clinical Impressions(s) / UC Diagnoses   Final diagnoses:  Sebaceous cyst of breast, left     Discharge Instructions      Keep area clean and dry.  Use antibacterial soap.  Warm compresses three times daily Take all antibiotics F/u with  OB  or PCP for recurrent  issues       ED Prescriptions     Medication Sig Dispense Auth. Provider   amoxicillin-clavulanate (AUGMENTIN) 875-125 MG tablet Take 1 tablet by mouth every 12 (twelve) hours. 14 tablet Laqueta Bonaventura, Linde Gillis, NP      PDMP not reviewed this encounter.   Nelda Marseille, NP 11/13/23 [phone]

## 2023-10-17 NOTE — ED Triage Notes (Signed)
Pt c/o abscess to lt breast 3 days ago. Denise drainage or meds.

## 2023-10-17 NOTE — Discharge Instructions (Addendum)
Keep area clean and dry.  Use antibacterial soap.  Warm compresses three times daily Take all antibiotics F/u with OB  or PCP for recurrent issues

## 2023-12-18 ENCOUNTER — Encounter (HOSPITAL_COMMUNITY): Payer: Self-pay | Admitting: Obstetrics and Gynecology

## 2023-12-18 ENCOUNTER — Inpatient Hospital Stay (HOSPITAL_COMMUNITY)
Admission: AD | Admit: 2023-12-18 | Discharge: 2023-12-18 | Disposition: A | Discharge status: Home / Self Care | Attending: Obstetrics and Gynecology | Admitting: Obstetrics and Gynecology

## 2023-12-18 DIAGNOSIS — O0932 Supervision of pregnancy with insufficient antenatal care, second trimester: Secondary | ICD-10-CM | POA: Insufficient documentation

## 2023-12-18 DIAGNOSIS — N898 Other specified noninflammatory disorders of vagina: Secondary | ICD-10-CM | POA: Diagnosis present

## 2023-12-18 DIAGNOSIS — O26892 Other specified pregnancy related conditions, second trimester: Secondary | ICD-10-CM | POA: Diagnosis not present

## 2023-12-18 DIAGNOSIS — R1030 Lower abdominal pain, unspecified: Secondary | ICD-10-CM | POA: Diagnosis present

## 2023-12-18 DIAGNOSIS — Z3A25 25 weeks gestation of pregnancy: Secondary | ICD-10-CM | POA: Diagnosis not present

## 2023-12-18 DIAGNOSIS — Z113 Encounter for screening for infections with a predominantly sexual mode of transmission: Secondary | ICD-10-CM | POA: Diagnosis present

## 2023-12-18 HISTORY — DX: Acute parametritis and pelvic cellulitis: N73.0

## 2023-12-18 LAB — CBC
HCT: 32.2 % — ABNORMAL LOW (ref 36.0–46.0)
Hemoglobin: 11.5 g/dL — ABNORMAL LOW (ref 12.0–15.0)
MCH: 30.4 pg (ref 26.0–34.0)
MCHC: 35.7 g/dL (ref 30.0–36.0)
MCV: 85.2 fL (ref 80.0–100.0)
Platelets: 170 10*3/uL (ref 150–400)
RBC: 3.78 MIL/uL — ABNORMAL LOW (ref 3.87–5.11)
RDW: 11.9 % (ref 11.5–15.5)
WBC: 7.4 10*3/uL (ref 4.0–10.5)
nRBC: 0 % (ref 0.0–0.2)

## 2023-12-18 LAB — WET PREP, GENITAL
Clue Cells Wet Prep HPF POC: NONE SEEN
Sperm: NONE SEEN
Trich, Wet Prep: NONE SEEN
WBC, Wet Prep HPF POC: <10 (ref <10)
Yeast Wet Prep HPF POC: NONE SEEN

## 2023-12-18 LAB — HEPATITIS B SURFACE ANTIGEN: Hepatitis B Surface Ag: NONREACTIVE

## 2023-12-18 LAB — URINALYSIS, ROUTINE W REFLEX MICROSCOPIC
Bilirubin Urine: NEGATIVE
Glucose, UA: NEGATIVE mg/dL
Hgb urine dipstick: NEGATIVE
Ketones, ur: NEGATIVE mg/dL
Leukocytes,Ua: NEGATIVE
Nitrite: NEGATIVE
Protein, ur: NEGATIVE mg/dL
Specific Gravity, Urine: 1.01 (ref 1.005–1.030)
pH: 7 (ref 5.0–8.0)

## 2023-12-18 LAB — HIV ANTIBODY (ROUTINE TESTING W REFLEX): HIV Screen 4th Generation wRfx: NONREACTIVE

## 2023-12-18 LAB — RPR: RPR Ser Ql: NONREACTIVE

## 2023-12-18 NOTE — MAU Note (Signed)
...  Amanda Russell is a 28 y.o. at [redacted]w[redacted]d here in MAU reporting: Intermittent bilateral sharp pains in her lower abdomen for one month now that is worse with movement and walking. She also reports concerns for an increase in vaginal discharge over this past month that has a foul odor and is at times [place]ieland. She reports it does not look like old blood. Has not had IC. Reports vaginal itching occasionally. Denies urinary sx's.  Onset of complaint: One month Pain score: Denies current pain. Last felt the pain while on the way to the hospital. 8/10 when it comes  Vitals:   12/18/23 1012  BP: 121/62  Pulse: 79  Resp: 16  Temp: 98.4 F (36.9 C)  SpO2: 100%      FHT: 163 initial external Lab orders placed from triage: UA

## 2023-12-18 NOTE — MAU Provider Note (Signed)
 History     CSN: [id number]  Arrival date and time: 12/18/23 5956   Event Date/Time   First Provider Initiated Contact with Patient 12/18/23 1109      Chief Complaint  Patient presents with   Abdominal Pain   Vaginal Discharge   HPI [name] is a 28 y.o. year old G1P2214 female at [redacted]w[redacted]d weeks gestation who presents to MAU reporting bilateral, sharp, bilateral pain in her lower abdomen x 1 month that is worse with movement/walking. She also reports vaginal discharge that has a foul odor that is Bye at times, "not like old blood" x 1 month. She has not had recent SI. She report occasional vaginal itching. She denies any urinary symptoms. She has had no prenatal care during this pregnancy. Per the patient she was scheduled to start PCN with [facility], but she no showed for 2 appts and was dismissed her from the practice. She states, "I don't know what I'm having, so am I going to have an U/S to find that out today?" She is also requesting bloodwork.  OB History     Gravida  6   Para  4   Term  2   Preterm  2   AB  1   Living  4      SAB      IAB      Ectopic  1   Multiple  0   Live Births  4           Past Medical History:  Diagnosis Date   ADHD (attention deficit hyperactivity disorder), combined type 07/14/2012   Carrier of group B Streptococcus 03/05/2023   + in urine   Chlamydia    Chlamydial infection 03/05/2023   History of preterm premature rupture of membranes 03/05/2023   32w. Per MFM, nightly progesterone   Hypertension 2022   after delivery   Intestinal infection by trichomonas vaginalis 03/05/2023   Left tubal pregnancy 10/16/2019   PID (acute pelvic inflammatory disease)    Pregnancy 03/28/2020   Preterm premature rupture of membranes 09/09/2021   Preterm premature rupture of membranes (PPROM) with unknown onset of labor 07/13/2014   Suicidal ideation     Past Surgical History:  Procedure Laterality Date    LAPAROSCOPY Left 10/16/2019   Procedure: LAPAROSCOPIC LEFT SALPINGECTOMY;  Surgeon: Kerrick Bing, MD;  Location: [facility];  Service: Gynecology;  Laterality: Left;    Family History  Problem Relation Age of Onset   Hypertension Mother    Other Father        MVA   Hypertension Other    Cancer Neg Hx    Diabetes Neg Hx     Social History   Tobacco Use   Smoking status: Former    Current packs/day: 0.00    Average packs/day: 0.5 packs/day for 0.8 years (0.4 ttl pk-yrs)    Types: Cigarettes    Start date: 03/21/2010    Quit date: 01/07/2011    Years since quitting: 12.9   Smokeless tobacco: Never  Vaping Use   Vaping status: Former  Substance Use Topics   Alcohol use: Not Currently    Comment: socially   Drug use: Not Currently    Frequency: 4.0 times per week    Types: Marijuana    Comment: Sep 2024    Allergies:  Allergies  Allergen Reactions   Pollen Extract Itching and Cough    No medications prior to admission.    Review of  Systems  Constitutional: Negative.   HENT: Negative.    Eyes: Negative.   Respiratory: Negative.    Cardiovascular: Negative.   Gastrointestinal: Negative.   Endocrine: Negative.   Genitourinary:  Positive for pelvic pain (intermittent, sharp x 1 month) and vaginal discharge (increased, foul odor, Dore at times).  Musculoskeletal: Negative.   Skin: Negative.   Allergic/Immunologic: Negative.   Neurological: Negative.   Hematological: Negative.   Psychiatric/Behavioral: Negative.     Physical Exam   Blood pressure 121/62, pulse 79, temperature 98.4 F (36.9 C), temperature source Oral, resp. rate 16, height 5\' 5"  (1.651 m), weight 73.9 kg, last menstrual period 06/02/2023, SpO2 100%.  Physical Exam Vitals and nursing note reviewed.  Constitutional:      Appearance: Normal appearance. She is normal weight.  Cardiovascular:     Rate and Rhythm: Normal rate.  Pulmonary:     Effort: Pulmonary effort is normal.  Genitourinary:     Comments: Swabs collected by patient using blind swab technique  Musculoskeletal:        General: Normal range of motion.  Skin:    General: Skin is warm and dry.  Neurological:     Mental Status: She is alert and oriented to person, place, and time.  Psychiatric:        Mood and Affect: Mood normal.        Behavior: Behavior normal.        Thought Content: Thought content normal.        Judgment: Judgment normal.    REACTIVE NST - FHR: 150 bpm / moderate variability / accels present / decels absent / TOCO: none  MAU Course  Procedures  MDM CCUA Prenatal Labs Wet Prep GC/CT -- Results pending  NST   Results for orders placed or performed during the hospital encounter of 12/18/23 (from the past 24 hours)  Urinalysis, Routine w reflex microscopic -Urine, Clean Catch     Status: Abnormal   Collection Time: 12/18/23 10:20 AM  Result Value Ref Range   Color, Urine YELLOW YELLOW   APPearance HAZY (A) CLEAR   Specific Gravity, Urine 1.010 1.005 - 1.030   pH 7.0 5.0 - 8.0   Glucose, UA NEGATIVE NEGATIVE mg/dL   Hgb urine dipstick NEGATIVE NEGATIVE   Bilirubin Urine NEGATIVE NEGATIVE   Ketones, ur NEGATIVE NEGATIVE mg/dL   Protein, ur NEGATIVE NEGATIVE mg/dL   Nitrite NEGATIVE NEGATIVE   Leukocytes,Ua NEGATIVE NEGATIVE  Wet prep, genital     Status: None   Collection Time: 12/18/23 11:20 AM   Specimen: Vaginal  Result Value Ref Range   Yeast Wet Prep HPF POC NONE SEEN NONE SEEN   Trich, Wet Prep NONE SEEN NONE SEEN   Clue Cells Wet Prep HPF POC NONE SEEN NONE SEEN   WBC, Wet Prep HPF POC <10 <10   Sperm NONE SEEN   CBC     Status: Abnormal   Collection Time: 12/18/23 11:25 AM  Result Value Ref Range   WBC 7.4 4.0 - 10.5 K/uL   RBC 3.78 (L) 3.87 - 5.11 MIL/uL   Hemoglobin 11.5 (L) 12.0 - 15.0 g/dL   HCT 16.1 (L) 09.6 - 04.5 %   MCV 85.2 80.0 - 100.0 fL   MCH 30.4 26.0 - 34.0 pg   MCHC 35.7 30.0 - 36.0 g/dL   RDW 40.9 81.1 - 91.4 %   Platelets 170 150 - 400 K/uL    nRBC 0.0 0.0 - 0.2 %  RPR  Status: None   Collection Time: 12/18/23 11:25 AM  Result Value Ref Range   RPR Ser Ql NON REACTIVE NON REACTIVE  HIV Antibody (routine testing w rflx)     Status: None   Collection Time: 12/18/23 11:25 AM  Result Value Ref Range   HIV Screen 4th Generation wRfx Non Reactive Non Reactive  Hepatitis B surface antigen     Status: None   Collection Time: 12/18/23 11:25 AM  Result Value Ref Range   Hepatitis B Surface Ag NON REACTIVE NON REACTIVE     Assessment and Plan  1. No prenatal care in current pregnancy in second trimester (Primary) - Prenatal labs drawn in MAU today - Start [facility] ASAP  2. Vaginal discharge during pregnancy in second trimester - Normal wet prep results  3. Screening examination for STI  4. [redacted] weeks gestation of pregnancy   - Discharge home - Message sent to [facility] to get scheduled for ONC - Patient verbalized an understanding of the plan of care and agrees.   Raelyn Mora, CNM 12/18/2023, 11:12 AM

## 2023-12-19 ENCOUNTER — Encounter: Payer: Self-pay | Admitting: Obstetrics and Gynecology

## 2023-12-19 LAB — GC/CHLAMYDIA PROBE AMP (~~LOC~~) NOT AT ARMC
Chlamydia: NEGATIVE
Comment: NEGATIVE
Comment: NORMAL
Neisseria Gonorrhea: NEGATIVE

## 2023-12-19 LAB — RUBELLA SCREEN: Rubella: 7.18 {index} (ref >0.99)

## 2023-12-30 NOTE — Progress Notes (Unsigned)
   PRENATAL VISIT NOTE  Subjective:  Amanda Russell is a 28 y.o. [phone] at [redacted]w[redacted]d being seen today for her first prenatal visit for this pregnancy.  She is currently monitored for the following issues for this {Blank single:19197::"high-risk","low-risk"} pregnancy and has Subchorionic hematoma in first trimester and No prenatal care in current pregnancy in second trimester on their problem list.  Patient reports {sx:14538}.   .  .   . Denies leaking of fluid.   She is planning to {Blank single:19197::"breastfeed","bottle feed"}. Desires *** for contraception.   The following portions of the patient's history were reviewed and updated as appropriate: allergies, current medications, past family history, past medical history, past social history, past surgical history and problem list.   Objective:  There were no vitals filed for this visit.  Fetal Status:           General:  Alert, oriented and cooperative. Patient is in no acute distress.  Skin: Skin is warm and dry. No rash noted.   Cardiovascular: Normal heart rate and rhythm noted  Respiratory: Normal respiratory effort, no problems with respiration noted. Clear to auscultation.   Abdomen: Soft, gravid, appropriate for gestational age. Normal bowel sounds. Non-tender.       Pelvic: Cervical exam deferred       Normal cervical contour, no lesions, no bleeding following pap, normal discharge  Extremities: Normal range of motion.     Mental Status: Normal mood and affect. Normal behavior. Normal judgment and thought content.    Indications for ASA therapy (per uptodate) One of the following: Previous pregnancy with preeclampsia, especially early onset and with an adverse outcome {yes/no:20286} Multifetal gestation {yes/no:20286} Chronic hypertension {yes/no:20286} Type 1 or 2 diabetes mellitus {yes/no:20286} Chronic kidney disease {yes/no:20286} Autoimmune disease (antiphospholipid syndrome, systemic lupus erythematosus)  {yes/no:20286}  Two or more of the following: Nulliparity No Obesity (body mass index >30 kg/m2) {yes/no:20286} Family history of preeclampsia in mother or sister {yes/no:20286} Age >=35 years No Sociodemographic characteristics (African American race, low socioeconomic level) Yes Personal risk factors (eg, previous pregnancy with low birth weight or small for gestational age infant, previous adverse pregnancy outcome [eg, stillbirth], interval >10 years between pregnancies) {yes/no:20286}  Assessment and Plan:  Pregnancy: [id number] at [redacted]w[redacted]d  1. Supervision of other normal pregnancy, antepartum (Primary) 2. Late prenatal care Continue prenatal vitamins. Genetic Screening discussed: NIPS, carrier screening and AFP *** Ultrasound discussed; fetal anatomic survey: *** Problem list reviewed and updated. Reviewed Brx optimized schedule, patient agreeable The nature of [facility] with multiple MDs and other Advanced Practice Providers was explained to patient; also emphasized that residents, students are part of our team. Routine obstetric precautions reviewed.   3. [redacted] weeks gestation of pregnancy Anticipatory guidance about next visits/weeks of pregnancy given.   4. History of preterm delivery, currently pregnant G2, [redacted]w[redacted]d G5, [redacted]w[redacted]d   Preterm labor/first trimester warning symptoms and general obstetric precautions including but not limited to vaginal bleeding, contractions, leaking of fluid and fetal movement were reviewed in detail with the patient.  Please refer to After Visit Summary for other counseling recommendations.   No follow-ups on file.  Future Appointments  Date Time Provider Department Center  01/02/2024  2:15 PM Christean Leaf [facility]    Ralene [place], [place]

## 2024-01-02 ENCOUNTER — Ambulatory Visit (INDEPENDENT_AMBULATORY_CARE_PROVIDER_SITE_OTHER): Admitting: Physician Assistant

## 2024-01-02 ENCOUNTER — Encounter: Payer: Self-pay | Admitting: Physician Assistant

## 2024-01-02 ENCOUNTER — Other Ambulatory Visit: Payer: Self-pay

## 2024-01-02 VITALS — BP 108/68 | HR 86 | Wt 159.0 lb

## 2024-01-02 DIAGNOSIS — O0990 Supervision of high risk pregnancy, unspecified, unspecified trimester: Secondary | ICD-10-CM | POA: Insufficient documentation

## 2024-01-02 DIAGNOSIS — O0930 Supervision of pregnancy with insufficient antenatal care, unspecified trimester: Secondary | ICD-10-CM

## 2024-01-02 DIAGNOSIS — O09893 Supervision of other high risk pregnancies, third trimester: Secondary | ICD-10-CM | POA: Diagnosis not present

## 2024-01-02 DIAGNOSIS — Z3A27 27 weeks gestation of pregnancy: Secondary | ICD-10-CM | POA: Diagnosis not present

## 2024-01-02 DIAGNOSIS — O09899 Supervision of other high risk pregnancies, unspecified trimester: Secondary | ICD-10-CM

## 2024-01-02 DIAGNOSIS — O0933 Supervision of pregnancy with insufficient antenatal care, third trimester: Secondary | ICD-10-CM

## 2024-01-02 DIAGNOSIS — Z8751 Personal history of pre-term labor: Secondary | ICD-10-CM | POA: Insufficient documentation

## 2024-01-02 DIAGNOSIS — Z3480 Encounter for supervision of other normal pregnancy, unspecified trimester: Secondary | ICD-10-CM

## 2024-01-03 ENCOUNTER — Encounter: Payer: Self-pay | Admitting: Physician Assistant

## 2024-01-03 LAB — HEMOGLOBIN A1C
Est. average glucose Bld gHb Est-mCnc: 103 mg/dL
Hgb A1c MFr Bld: 5.2 % (ref 4.8–5.6)

## 2024-01-06 ENCOUNTER — Encounter: Payer: Self-pay | Admitting: *Deleted

## 2024-01-08 ENCOUNTER — Encounter: Payer: Self-pay | Admitting: Physician Assistant

## 2024-01-08 LAB — PANORAMA PRENATAL TEST FULL PANEL:PANORAMA TEST PLUS 5 ADDITIONAL MICRODELETIONS: FETAL FRACTION: 11

## 2024-01-09 LAB — HORIZON CUSTOM: REPORT SUMMARY: NEGATIVE

## 2024-01-10 ENCOUNTER — Other Ambulatory Visit: Payer: Self-pay

## 2024-01-10 ENCOUNTER — Other Ambulatory Visit

## 2024-01-10 ENCOUNTER — Encounter: Payer: Self-pay | Admitting: Physician Assistant

## 2024-01-10 DIAGNOSIS — Z3480 Encounter for supervision of other normal pregnancy, unspecified trimester: Secondary | ICD-10-CM

## 2024-01-10 DIAGNOSIS — Z3A27 27 weeks gestation of pregnancy: Secondary | ICD-10-CM

## 2024-01-10 DIAGNOSIS — Z3490 Encounter for supervision of normal pregnancy, unspecified, unspecified trimester: Secondary | ICD-10-CM

## 2024-01-11 LAB — HIV ANTIBODY (ROUTINE TESTING W REFLEX): HIV Screen 4th Generation wRfx: NONREACTIVE

## 2024-01-11 LAB — CBC
Hematocrit: 32.5 % — ABNORMAL LOW (ref 34.0–46.6)
Hemoglobin: 11.1 g/dL (ref 11.1–15.9)
MCH: 29.5 pg (ref 26.6–33.0)
MCHC: 34.2 g/dL (ref 31.5–35.7)
MCV: 86 fL (ref 79–97)
Platelets: 167 10*3/uL (ref 150–450)
RBC: 3.76 x10E6/uL — ABNORMAL LOW (ref 3.77–5.28)
RDW: 12 % (ref 11.7–15.4)
WBC: 7.3 10*3/uL (ref 3.4–10.8)

## 2024-01-11 LAB — GLUCOSE TOLERANCE, 2 HOURS W/ 1HR
Glucose, 1 hour: 129 mg/dL (ref 70–179)
Glucose, 2 hour: 74 mg/dL (ref 70–152)
Glucose, Fasting: 79 mg/dL (ref 70–91)

## 2024-01-11 LAB — RPR: RPR Ser Ql: NONREACTIVE

## 2024-01-15 ENCOUNTER — Encounter: Payer: Self-pay | Admitting: Physician Assistant

## 2024-01-21 ENCOUNTER — Encounter: Admitting: Obstetrics and Gynecology

## 2024-01-27 ENCOUNTER — Ambulatory Visit

## 2024-01-29 NOTE — Progress Notes (Signed)
 New OB Intake  I connected with Airyn C Burgett  on 01/29/24 at 1130 by phone and verified that I am speaking with the correct person using two identifiers. Nurse is located at [facility] and pt is located at home.   I explained I am completing New Patient Intake today. We discussed EDD of 03/31/2024, by Ultrasound. Pt is [id number]. I reviewed her allergies, medications and Medical/Surgical/OB history.    Problem List There are no diagnoses linked to this encounter.  OB History  Gravida Para Term Preterm AB Living  6 4 2 2 1 4   SAB IAB Ectopic Multiple Live Births    1 0 4    # Outcome Date GA Lbr Len/2nd Weight Sex Type Anes PTL Lv  6 Current           5 Preterm 09/09/21 [redacted]w[redacted]d 07:12 / 00:03 4 lb 9.4 oz (2.08 kg) M Vag-Spont None  LIV  4 Term 09/04/20 [redacted]w[redacted]d 07:22 / 00:19 6 lb 10.7 oz (3.025 kg) M Vag-Spont EPI  LIV     Birth Comments: WNL  3 Ectopic 10/16/19 [redacted]w[redacted]d   U      2 Preterm 07/13/14 [redacted]w[redacted]d 11:31 / 00:12 2 lb 13.2 oz (1.281 kg) M Vag-Spont None  LIV  1 Term 10/13/11 [redacted]w[redacted]d 21:02 / 02:19 7 lb 4.1 oz (3.29 kg) M Vag-Spont EPI  LIV     Birth Comments: caput     Past Medical History:  Diagnosis Date   ADHD (attention deficit hyperactivity disorder), combined type 07/14/2012   Carrier of group B Streptococcus 03/05/2023   + in urine   Chlamydia    Chlamydial infection 03/05/2023   History of preterm premature rupture of membranes 03/05/2023   32w. Per MFM, nightly progesterone   Hypertension 2022   after delivery   Intestinal infection by trichomonas vaginalis 03/05/2023   Left tubal pregnancy 10/16/2019   PID (acute pelvic inflammatory disease)    Pregnancy 03/28/2020   Preterm premature rupture of membranes 09/09/2021   Preterm premature rupture of membranes (PPROM) with unknown onset of labor 07/13/2014   Suicidal ideation        Past Surgical History:  Procedure Laterality Date   LAPAROSCOPY Left 10/16/2019   Procedure: LAPAROSCOPIC LEFT SALPINGECTOMY;   Surgeon: Raynell Caller, MD;  Location: [facility];  Service: Gynecology;  Laterality: Left;     Current Outpatient Medications on File Prior to Visit  Medication Sig Dispense Refill   Prenatal Vit-Fe Fumarate-FA (MULTIVITAMIN-PRENATAL) 27-0.8 MG TABS tablet Take 1 tablet by mouth daily at 12 noon.     No current facility-administered medications on file prior to visit.      Allergies  Allergen Reactions   Pollen Extract Itching and Cough     Patient advised of first appointment in our office and when to arrive.   All questions were answered.

## 2024-01-30 ENCOUNTER — Ambulatory Visit (HOSPITAL_BASED_OUTPATIENT_CLINIC_OR_DEPARTMENT_OTHER)

## 2024-01-30 ENCOUNTER — Other Ambulatory Visit: Payer: Self-pay | Admitting: Physician Assistant

## 2024-01-30 ENCOUNTER — Ambulatory Visit

## 2024-01-30 ENCOUNTER — Ambulatory Visit: Attending: Maternal & Fetal Medicine | Admitting: Maternal & Fetal Medicine

## 2024-01-30 VITALS — BP 111/57 | HR 63

## 2024-01-30 DIAGNOSIS — Z3480 Encounter for supervision of other normal pregnancy, unspecified trimester: Secondary | ICD-10-CM

## 2024-01-30 DIAGNOSIS — O0990 Supervision of high risk pregnancy, unspecified, unspecified trimester: Secondary | ICD-10-CM

## 2024-01-30 DIAGNOSIS — O0933 Supervision of pregnancy with insufficient antenatal care, third trimester: Secondary | ICD-10-CM | POA: Insufficient documentation

## 2024-01-30 DIAGNOSIS — O365931 Maternal care for other known or suspected poor fetal growth, third trimester, fetus 1: Secondary | ICD-10-CM

## 2024-01-30 DIAGNOSIS — O365930 Maternal care for other known or suspected poor fetal growth, third trimester, not applicable or unspecified: Secondary | ICD-10-CM

## 2024-01-30 DIAGNOSIS — O09213 Supervision of pregnancy with history of pre-term labor, third trimester: Secondary | ICD-10-CM | POA: Diagnosis not present

## 2024-01-30 DIAGNOSIS — Z363 Encounter for antenatal screening for malformations: Secondary | ICD-10-CM | POA: Diagnosis not present

## 2024-01-30 DIAGNOSIS — Z3A31 31 weeks gestation of pregnancy: Secondary | ICD-10-CM | POA: Diagnosis not present

## 2024-01-30 DIAGNOSIS — O0932 Supervision of pregnancy with insufficient antenatal care, second trimester: Secondary | ICD-10-CM

## 2024-01-30 DIAGNOSIS — O365990 Maternal care for other known or suspected poor fetal growth, unspecified trimester, not applicable or unspecified: Secondary | ICD-10-CM

## 2024-01-30 DIAGNOSIS — Z8759 Personal history of other complications of pregnancy, childbirth and the puerperium: Secondary | ICD-10-CM

## 2024-01-30 NOTE — Procedures (Signed)
 Amanda Russell [birth year] [redacted]w[redacted]d  Fetus A Non-Stress Test Interpretation for 01/30/24  Indication: IUGR  Fetal Heart Rate A Mode: External Baseline Rate (A): 130 bpm Variability: Moderate Accelerations: 10 x 10 Multiple birth?: No  Uterine Activity Mode: Toco, Palpation Contraction Frequency (min): Two ctx noted. Pt reports not feeling them. Contraction Duration (sec): 60-90 Contraction Quality: Mild Resting Tone Palpated: Relaxed  Interpretation (Fetal Testing) Nonstress Test Interpretation: Reactive Comments: Reviewed with Dr. Arcola Kocher

## 2024-01-30 NOTE — Progress Notes (Signed)
 MFM Consultation  Amanda Russell is a 28 yo G6P4 at 54 w 2 d with an EDD of 03/31/24. She is seen for a new late to care detailed exam  Today we observed fetal growth restriction with an EFW of 10% with AC 2.9%. The UAD were normal without evidence of AEDF or REDF. There was good fetal movement and normal amniotic fluid volume.  Suboptimal views of the fetal anatomy was obtained secondary to fetal position.  BPP 10/10.    Amanda Russell  pregnancy is otherwise uncomplicated.   However, she has a low risk NIPT and horizon.   Impression/Counseling.   I discussed today's visit concerning the diagnosis of severe IUGR.   I explained that the etiology includes placental insufficiency, chronic disease, infection, aneuploidy and other genetic syndromes. She has a low risk NIPS and neg horizon. She has no additional risk factors for chronic disease.   At this time I explained the diagnosis, evaluation and management to include on going fetal growth and weekly antenatal testing to include UA Dopplers.   Given the [facility] < 3% I recommend delivery at 37 weeks.  Regarding her preterm delivery she denies s/sx of preterm labor. Given her gestational age vaginal progesterone is not indicated.   All questions answered  I spent 30 minutes with > 50% in face to face consultation and care coordination.  Tonya Fredrickson, MD

## 2024-02-13 ENCOUNTER — Other Ambulatory Visit: Payer: Self-pay

## 2024-02-13 DIAGNOSIS — O42919 Preterm premature rupture of membranes, unspecified as to length of time between rupture and onset of labor, unspecified trimester: Secondary | ICD-10-CM

## 2024-02-14 ENCOUNTER — Other Ambulatory Visit

## 2024-02-14 ENCOUNTER — Ambulatory Visit: Attending: Clinical

## 2024-02-18 ENCOUNTER — Other Ambulatory Visit: Payer: Self-pay | Admitting: *Deleted

## 2024-02-18 DIAGNOSIS — O09893 Supervision of other high risk pregnancies, third trimester: Secondary | ICD-10-CM

## 2024-02-19 ENCOUNTER — Other Ambulatory Visit: Payer: Self-pay

## 2024-02-19 ENCOUNTER — Encounter (HOSPITAL_COMMUNITY): Payer: Self-pay | Admitting: Obstetrics and Gynecology

## 2024-02-19 ENCOUNTER — Inpatient Hospital Stay (HOSPITAL_COMMUNITY)
Admission: AD | Admit: 2024-02-19 | Discharge: 2024-02-20 | DRG: 807 | Disposition: A | Discharge status: Home / Self Care | Attending: Obstetrics and Gynecology | Admitting: Obstetrics and Gynecology

## 2024-02-19 DIAGNOSIS — Z3A34 34 weeks gestation of pregnancy: Secondary | ICD-10-CM | POA: Diagnosis not present

## 2024-02-19 DIAGNOSIS — Z8249 Family history of ischemic heart disease and other diseases of the circulatory system: Secondary | ICD-10-CM

## 2024-02-19 DIAGNOSIS — O42913 Preterm premature rupture of membranes, unspecified as to length of time between rupture and onset of labor, third trimester: Secondary | ICD-10-CM | POA: Diagnosis present

## 2024-02-19 DIAGNOSIS — Z87891 Personal history of nicotine dependence: Secondary | ICD-10-CM

## 2024-02-19 DIAGNOSIS — O365930 Maternal care for other known or suspected poor fetal growth, third trimester, not applicable or unspecified: Secondary | ICD-10-CM | POA: Diagnosis present

## 2024-02-19 DIAGNOSIS — O0933 Supervision of pregnancy with insufficient antenatal care, third trimester: Secondary | ICD-10-CM | POA: Diagnosis not present

## 2024-02-19 DIAGNOSIS — O42919 Preterm premature rupture of membranes, unspecified as to length of time between rupture and onset of labor, unspecified trimester: Secondary | ICD-10-CM | POA: Diagnosis present

## 2024-02-19 DIAGNOSIS — O0932 Supervision of pregnancy with insufficient antenatal care, second trimester: Principal | ICD-10-CM

## 2024-02-19 DIAGNOSIS — O99824 Streptococcus B carrier state complicating childbirth: Secondary | ICD-10-CM | POA: Diagnosis present

## 2024-02-19 DIAGNOSIS — O418X10 Other specified disorders of amniotic fluid and membranes, first trimester, not applicable or unspecified: Secondary | ICD-10-CM | POA: Diagnosis present

## 2024-02-19 DIAGNOSIS — O42013 Preterm premature rupture of membranes, onset of labor within 24 hours of rupture, third trimester: Secondary | ICD-10-CM | POA: Diagnosis not present

## 2024-02-19 DIAGNOSIS — O0990 Supervision of high risk pregnancy, unspecified, unspecified trimester: Secondary | ICD-10-CM

## 2024-02-19 LAB — CBC
HCT: 33.9 % — ABNORMAL LOW (ref 36.0–46.0)
Hemoglobin: 11.9 g/dL — ABNORMAL LOW (ref 12.0–15.0)
MCH: 29.8 pg (ref 26.0–34.0)
MCHC: 35.1 g/dL (ref 30.0–36.0)
MCV: 85 fL (ref 80.0–100.0)
Platelets: 165 10*3/uL (ref 150–400)
RBC: 3.99 MIL/uL (ref 3.87–5.11)
RDW: 12.9 % (ref 11.5–15.5)
WBC: 13.1 10*3/uL — ABNORMAL HIGH (ref 4.0–10.5)
nRBC: 0 % (ref 0.0–0.2)

## 2024-02-19 LAB — TYPE AND SCREEN
ABO/RH(D): B POS
Antibody Screen: NEGATIVE

## 2024-02-19 LAB — RPR: RPR Ser Ql: NONREACTIVE

## 2024-02-19 LAB — HIV ANTIBODY (ROUTINE TESTING W REFLEX): HIV Screen 4th Generation wRfx: NONREACTIVE

## 2024-02-19 MED ORDER — SODIUM CHLORIDE 0.9 % IV SOLN
2.0000 g | Freq: Once | INTRAVENOUS | Status: AC
  Administered 2024-02-19: 2 g via INTRAVENOUS
  Filled 2024-02-19: qty 2000

## 2024-02-19 MED ORDER — ACETAMINOPHEN 325 MG PO TABS
650.0000 mg | ORAL_TABLET | ORAL | Status: DC | PRN
  Administered 2024-02-19: 650 mg via ORAL
  Filled 2024-02-19: qty 2

## 2024-02-19 MED ORDER — SENNOSIDES-DOCUSATE SODIUM 8.6-50 MG PO TABS
2.0000 | ORAL_TABLET | ORAL | Status: DC
  Filled 2024-02-19: qty 2

## 2024-02-19 MED ORDER — LIDOCAINE HCL (PF) 1 % IJ SOLN: 30.0000 mL | INTRAMUSCULAR | Status: DC | PRN

## 2024-02-19 MED ORDER — TETANUS-DIPHTH-ACELL PERTUSSIS 5-2.5-18.5 LF-MCG/0.5 IM SUSY: 0.5000 mL | PREFILLED_SYRINGE | Freq: Once | INTRAMUSCULAR | Status: DC

## 2024-02-19 MED ORDER — ONDANSETRON HCL 4 MG/2ML IJ SOLN
4.0000 mg | INTRAMUSCULAR | Status: DC | PRN
  Administered 2024-02-19 (×2): 4 mg via INTRAVENOUS
  Filled 2024-02-19 (×2): qty 2

## 2024-02-19 MED ORDER — IBUPROFEN 600 MG PO TABS
600.0000 mg | ORAL_TABLET | Freq: Four times a day (QID) | ORAL | Status: DC
  Administered 2024-02-19 – 2024-02-20 (×4): 600 mg via ORAL
  Filled 2024-02-19 (×4): qty 1

## 2024-02-19 MED ORDER — PRENATAL MULTIVITAMIN CH: 1.0000 | ORAL_TABLET | Freq: Every day | ORAL | Status: DC

## 2024-02-19 MED ORDER — LACTATED RINGERS IV SOLN: 500.0000 mL | INTRAVENOUS | Status: DC | PRN

## 2024-02-19 MED ORDER — ACETAMINOPHEN 325 MG PO TABS: 650.0000 mg | ORAL_TABLET | ORAL | Status: DC | PRN

## 2024-02-19 MED ORDER — OXYTOCIN BOLUS FROM INFUSION
333.0000 mL | Freq: Once | INTRAVENOUS | Status: AC
  Administered 2024-02-19: 333 mL via INTRAVENOUS

## 2024-02-19 MED ORDER — SIMETHICONE 80 MG PO CHEW: 80.0000 mg | CHEWABLE_TABLET | ORAL | Status: DC | PRN

## 2024-02-19 MED ORDER — ONDANSETRON HCL 4 MG/2ML IJ SOLN
4.0000 mg | Freq: Four times a day (QID) | INTRAMUSCULAR | Status: DC | PRN
  Administered 2024-02-19: 4 mg via INTRAVENOUS
  Filled 2024-02-19: qty 2

## 2024-02-19 MED ORDER — ONDANSETRON HCL 4 MG PO TABS
4.0000 mg | ORAL_TABLET | ORAL | Status: DC | PRN
  Filled 2024-02-19: qty 1

## 2024-02-19 MED ORDER — SODIUM CHLORIDE 0.9 % IV SOLN: 250.0000 mL | INTRAVENOUS | Status: DC | PRN

## 2024-02-19 MED ORDER — COCONUT OIL OIL: 1.0000 | TOPICAL_OIL | Status: DC | PRN

## 2024-02-19 MED ORDER — BENZOCAINE-MENTHOL 20-0.5 % EX AERO: 1.0000 | INHALATION_SPRAY | CUTANEOUS | Status: DC | PRN

## 2024-02-19 MED ORDER — DIPHENHYDRAMINE HCL 25 MG PO CAPS: 25.0000 mg | ORAL_CAPSULE | Freq: Four times a day (QID) | ORAL | Status: DC | PRN

## 2024-02-19 MED ORDER — DIBUCAINE (PERIANAL) 1 % EX OINT: 1.0000 | TOPICAL_OINTMENT | CUTANEOUS | Status: DC | PRN

## 2024-02-19 MED ORDER — SODIUM CHLORIDE 0.9% FLUSH: 3.0000 mL | INTRAVENOUS | Status: DC | PRN

## 2024-02-19 MED ORDER — ZOLPIDEM TARTRATE 5 MG PO TABS
5.0000 mg | ORAL_TABLET | Freq: Every evening | ORAL | Status: DC | PRN
  Administered 2024-02-19: 5 mg via ORAL
  Filled 2024-02-19: qty 1

## 2024-02-19 MED ORDER — LACTATED RINGERS IV SOLN: INTRAVENOUS | Status: DC

## 2024-02-19 MED ORDER — OXYTOCIN-SODIUM CHLORIDE 30-0.9 UT/500ML-% IV SOLN
2.5000 [IU]/h | INTRAVENOUS | Status: DC
  Filled 2024-02-19: qty 500

## 2024-02-19 MED ORDER — OXYCODONE-ACETAMINOPHEN 5-325 MG PO TABS: 2.0000 | ORAL_TABLET | ORAL | Status: DC | PRN

## 2024-02-19 MED ORDER — SODIUM CHLORIDE 0.9% FLUSH
3.0000 mL | Freq: Two times a day (BID) | INTRAVENOUS | Status: DC
  Administered 2024-02-19: 3 mL via INTRAVENOUS

## 2024-02-19 MED ORDER — WITCH HAZEL-GLYCERIN EX PADS: 1.0000 | MEDICATED_PAD | CUTANEOUS | Status: DC | PRN

## 2024-02-19 MED ORDER — OXYCODONE-ACETAMINOPHEN 5-325 MG PO TABS: 1.0000 | ORAL_TABLET | ORAL | Status: DC | PRN

## 2024-02-19 MED ORDER — SOD CITRATE-CITRIC ACID 500-334 MG/5ML PO SOLN: 30.0000 mL | ORAL | Status: DC | PRN

## 2024-02-19 MED ORDER — SODIUM CHLORIDE 0.9 % IV SOLN: 1.0000 g | INTRAVENOUS | Status: DC

## 2024-02-19 MED ORDER — MEASLES, MUMPS & RUBELLA VAC IJ SOLR: 0.5000 mL | Freq: Once | INTRAMUSCULAR | Status: DC

## 2024-02-19 NOTE — H&P (Signed)
 OBSTETRIC ADMISSION HISTORY AND PHYSICAL  Amanda Russell is a 28 y.o. female [phone] with IUP at [redacted]w[redacted]d by ultrasound presenting for preterm contractions followed by precipitous delivery after PPROM on 02/18/24. She reports +FMs, No LOF, no VB, no blurry vision, headaches or peripheral edema, and RUQ pain.  She plans on breast and formula feeding. She request nexplanon  for birth control. She received her prenatal care at [facility]   Dating: By ultrasound --->  Estimated Date of Delivery: 03/31/24  Sono:    @[redacted]w[redacted]d , normal anatomy, cephalic presentation, 1514g, 09% EFW   Prenatal History/Complications:  - Preterm delivery - Subchorionic hematoma - No prenatal care in second trimester - Fetal growth restriction - PPROM  Past Medical History: Past Medical History:  Diagnosis Date   Carrier of group B Streptococcus 03/05/2023   + in urine   Chlamydia    Chlamydial infection 03/05/2023   History of preterm premature rupture of membranes 03/05/2023   32w. Per MFM, nightly progesterone   Hypertension 2022   after delivery   Intestinal infection by trichomonas vaginalis 03/05/2023   Left tubal pregnancy 10/16/2019   PID (acute pelvic inflammatory disease)    Pregnancy 03/28/2020   Preterm premature rupture of membranes 09/09/2021   Preterm premature rupture of membranes (PPROM) with unknown onset of labor 07/13/2014   Suicidal ideation     Past Surgical History: Past Surgical History:  Procedure Laterality Date   LAPAROSCOPY Left 10/16/2019   Procedure: LAPAROSCOPIC LEFT SALPINGECTOMY;  Surgeon: Raynell Caller, MD;  Location: [facility];  Service: Gynecology;  Laterality: Left;    Obstetrical History: OB History     Gravida  6   Para  4   Term  2   Preterm  2   AB  1   Living  4      SAB      IAB      Ectopic  1   Multiple  0   Live Births  4           Social History Social History   Socioeconomic History   Marital status: Single    Spouse name: Not on  file   Number of children: Not on file   Years of education: Not on file   Highest education level: Not on file  Occupational History   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 [employer]  Tobacco Use   Smoking status: Former    Current packs/day: 0.00    Average packs/day: 0.5 packs/day for 0.8 years (0.4 ttl pk-yrs)    Types: Cigarettes    Start date: 03/21/2010    Quit date: 01/07/2011    Years since quitting: 13.1   Smokeless tobacco: Never  Vaping Use   Vaping status: Former  Substance and Sexual Activity   Alcohol use: Not Currently    Comment: socially   Drug use: Not Currently    Frequency: 4.0 times per week    Types: Marijuana    Comment: last use 2022   Sexual activity: Not Currently    Partners: Male    Birth control/protection: None  Other Topics Concern   Not on file  Social History Narrative   Not on file   Social Drivers of Health   Financial Resource Strain: Not on file  Food Insecurity: No Food Insecurity (01/02/2024)   Hunger Vital Sign    Worried About Running Out of Food in the Last Year: Never true    Ran Out of Food  in the Last Year: Never true  Transportation Needs: No Transportation Needs (01/02/2024)   PRAPARE - Administrator, Civil Service (Medical): No    Lack of Transportation (Non-Medical): No  Physical Activity: Not on file  Stress: Not on file  Social Connections: Not on file    Family History: Family History  Problem Relation Age of Onset   Hypertension Mother    Other Father        MVA   Hypertension Other    Cancer Neg Hx    Diabetes Neg Hx    Asthma Neg Hx    Heart disease Neg Hx     Allergies: Allergies  Allergen Reactions   Pollen Extract Itching and Cough    Medications Prior to Admission  Medication Sig Dispense Refill Last Dose/Taking   Prenatal Vit-Fe Fumarate-FA (MULTIVITAMIN-PRENATAL) 27-0.8 MG TABS tablet Take 1 tablet by mouth daily at 12 noon.        Review of Systems   All systems  reviewed and negative except as stated in HPI  Blood pressure (!) 114/93, pulse 73, temperature 97.6 F (36.4 C), temperature source Oral, resp. rate 18, last menstrual period 06/02/2023. General appearance: alert and cooperative Lungs: clear to auscultation bilaterally Heart: regular rate and rhythm Abdomen: soft, non-tender; bowel sounds normal Pelvic: normal female genitalia Extremities: Homans sign is negative, no sign of DVT Presentation: cephalic Fetal monitoringBaseline: 120 bpm, Variability: Good {> 6 bpm), Accelerations: non-reactive, and Decelerations: prolonged  Uterine activity q62mins Dilation: 10   Prenatal labs: ABO, Rh:   Antibody:   Rubella: 7.18 (03/19 1125) RPR: Non Reactive (04/11 0842)  HBsAg: NON REACTIVE (03/19 1125)  HIV: Non Reactive (04/11 0842)  GBS:      Lab Results  Component Value Date   GBS Negative 09/04/2020   GBS Positive 09/04/2020   GTT WNL Genetic screening  Not done Anatomy US  Normal  Immunization History  Administered Date(s) Administered   Influenza,inj,Quad PF,6+ Mos 07/15/2014   MMR 09/11/2021   Tdap 07/14/2014, 08/01/2016, 06/10/2020, 09/11/2021    Prenatal Transfer Tool  Maternal Diabetes: No Genetic Screening: Declined; Not done Maternal Ultrasounds/Referrals: IUGR Fetal Ultrasounds or other Referrals:  Referred to Materal Fetal Medicine  Maternal Substance Abuse:  No Significant Maternal Medications:  None Significant Maternal Lab Results: Other: unknown GBS Number of Prenatal Visits:Less than or equal to 3 verified prenatal visits Maternal Vaccinations:none Other Comments:  None   No results found for this or any previous visit (from the past 24 hours).  Patient Active Problem List   Diagnosis Date Noted   Supervision of high risk pregnancy, antepartum 01/02/2024   No prenatal care in current pregnancy in second trimester 12/18/2023   Subchorionic hematoma in first trimester 09/01/2023   Hx Preterm premature  rupture of membranes 09/09/2021    Assessment/Plan:  Amanda Russell is a 28 y.o. [id number] at [redacted]w[redacted]d here for preterm contractions following PPROM on 02/18/24.  #Labor: Patient presents fully dilated with preterm contractions following PPROM. Patient amenable to delivering immediately. #Pain: Per pt request #FWB: Cat 2  #GBS status:  Unknown #Feeding: Breastmilk  and Formula #Reproductive Life planning: Nexplanon  #Circ:  yes  #Preterm Delivery #Fetal Growth Restriction  - Neonatology notified with plans to admit  Park Bolk, Medical Student  02/19/2024, 7:48 AM   Evaluation and management procedures were performed by the MS3 Lydia Sams under my supervision. I was immediately available for direct supervision, assistance and direction throughout this encounter.  I also confirm  that I have verified the information documented in the student's note, and that I have also personally reperformed the pertinent components of the physical exam and all of the medical decision making activities.  I have also made any necessary editorial changes.   Candice Chalet, MD Family Medicine - Obstetrics Fellow

## 2024-02-19 NOTE — MAU Note (Signed)
 Pt came in EMS uncomfortable. Stated water  broke yesterday. Ctx 3-6 min. SVE 10/0/-1 by Barbie Lex.

## 2024-02-19 NOTE — Discharge Summary (Signed)
 Postpartum Discharge Summary  Date of Service updated 02/20/2024     Patient Name: Amanda Russell DOB: 1996-05-19 MRN: [id number]  Date of admission: 02/19/2024 Delivery date:02/19/2024 Delivering provider: CHUBB, CASEY C Date of discharge: 02/20/2024  Admitting diagnosis: Preterm labor [O60.00] Intrauterine pregnancy: [redacted]w[redacted]d     Secondary diagnosis:  Active Problems:   Hx Preterm premature rupture of membranes   Subchorionic hematoma in first trimester   No prenatal care in current pregnancy in second trimester   Supervision of high risk pregnancy, antepartum   Preterm labor  Additional problems: Social work - CPS case opened for newborn custody    Discharge diagnosis: Term Pregnancy Delivered and Limited [facility]                                              Post partum procedures:None, declined Nexplanon   at time of insertion time out. Augmentation: N/A Complications: PPROM "midday 5/20" followed by PTL and then precipitous delivery upon arrival  Hospital course: Onset of Labor With Vaginal Delivery      28 y.o. yo [id number] at [redacted]w[redacted]d was admitted in Active Labor on 02/19/2024. Labor course was complicated by not seeking care with PPROM at 5/20 that pt reports was "midday" or after PTL until calling EMS morning of 5/21 and arrived here with precipitous delivery of [redacted]w[redacted]d baby.   Membrane Rupture Time/Date: 5:00 AM,02/18/2024  Delivery Method:Vaginal, Spontaneous Operative Delivery:N/A Episiotomy: None Lacerations:  None Patient had a postpartum course complicated by custody uncertainty.  She is ambulating, tolerating a regular diet, passing flatus, and urinating well. Patient is discharged home in stable condition on 02/20/24.  Newborn Data: Birth date:02/19/2024 Birth time:7:35 AM Gender:Female Living status:Living Apgars:8 ,9  Weight:2350 g  Magnesium  Sulfate received: No BMZ received: No Rhophylac:N/A MMR:N/A T-DaP:offered post partum, declined Flu: No RSV Vaccine received:  No Transfusion:No  Immunizations received: Immunization History  Administered Date(s) Administered   Influenza,inj,Quad PF,6+ Mos 07/15/2014   MMR 09/11/2021   Tdap 07/14/2014, 08/01/2016, 06/10/2020, 09/11/2021    Physical exam  Vitals:   02/19/24 1830 02/19/24 2230 02/20/24 0441 02/20/24 0810  BP: 110/74 (!) 92/59 108/73 106/74  Pulse:  (!) 59 (!) 55 60  Resp:  16 17 16   Temp:  98.9 F (37.2 C) 98.6 F (37 C) 98.4 F (36.9 C)  TempSrc:  Oral Oral Oral  SpO2:  100%    Weight:      Height:       General: alert, cooperative, and no distress Lochia: appropriate Uterine Fundus: firm Incision: N/A DVT Evaluation: No evidence of DVT seen on physical exam. Negative Homan's sign. No cords or calf tenderness. Labs: Lab Results  Component Value Date   WBC 13.1 (H) 02/19/2024   HGB 11.9 (L) 02/19/2024   HCT 33.9 (L) 02/19/2024   MCV 85.0 02/19/2024   PLT 165 02/19/2024      Latest Ref Rng & Units 08/06/2023   12:20 AM  CMP  Glucose 70 - 99 mg/dL 95   BUN 6 - 20 mg/dL 6   Creatinine 9.62 - 9.52 mg/dL 8.41   Sodium 324 - 401 mmol/L 138   Potassium 3.5 - 5.1 mmol/L 3.0   Chloride 98 - 111 mmol/L 103   CO2 22 - 32 mmol/L 24   Calcium  8.9 - 10.3 mg/dL 9.1   Total Protein 6.5 - 8.1 g/dL 7.1  Total Bilirubin <1.2 mg/dL 1.0   Alkaline Phos 38 - 126 U/L 60   AST 15 - 41 U/L 16   ALT 0 - 44 U/L 21    Edinburgh Score:    02/19/2024   10:28 PM  Edinburgh Postnatal Depression Scale Screening Tool  I have been able to laugh and see the funny side of things. 0  I have looked forward with enjoyment to things. 0  I have blamed myself unnecessarily when things went wrong. 1  I have been anxious or worried for no good reason. 0  I have felt scared or panicky for no good reason. 0  Things have been getting on top of me. 0  I have been so unhappy that I have had difficulty sleeping. 0  I have felt sad or miserable. 0  I have been so unhappy that I have been crying. 0  The  thought of harming myself has occurred to me. 0  Edinburgh Postnatal Depression Scale Total 1   Edinburgh Postnatal Depression Scale Total: 1   After visit meds:  Allergies as of 02/20/2024       Reactions   Pollen Extract Itching, Cough        Medication List     TAKE these medications    acetaminophen  325 MG tablet Commonly known as: Tylenol  Take 2 tablets (650 mg total) by mouth every 4 (four) hours as needed (for pain scale < 4).   coconut oil Oil Apply 1 Application topically as needed.   ibuprofen  600 MG tablet Commonly known as: ADVIL  Take 1 tablet (600 mg total) by mouth every 6 (six) hours.   multivitamin-prenatal 27-0.8 MG Tabs tablet Take 1 tablet by mouth daily at 12 noon.         Discharge home in stable condition Infant Feeding: Donor breast milk  Infant Disposition:NICU Discharge instruction: per After Visit Summary and Postpartum booklet. Activity: Advance as tolerated. Pelvic rest for 6 weeks.  Diet: routine diet Future Appointments: Future Appointments  Date Time Provider Department Center  03/31/2024  2:35 PM Tresia Fruit, MD [facility]   Follow up Visit: Message to [facility]  Please schedule this patient for a In person postpartum visit in 4 weeks with the following provider: Any provider. Additional Postpartum F/U:n/a  High risk pregnancy complicated by: limited PNC, PPROM followed by PTD at 34weeks  Delivery mode:  Vaginal, Spontaneous Anticipated Birth Control:  Nexplanon  - declined at time of time out. Declined contraception.   02/20/2024 Darrow End, MD

## 2024-02-20 ENCOUNTER — Ambulatory Visit (HOSPITAL_COMMUNITY): Payer: Self-pay

## 2024-02-20 ENCOUNTER — Other Ambulatory Visit (HOSPITAL_COMMUNITY): Payer: Self-pay

## 2024-02-20 MED ORDER — ACETAMINOPHEN 325 MG PO TABS: 650.0000 mg | ORAL_TABLET | ORAL | Status: AC | PRN

## 2024-02-20 MED ORDER — IBUPROFEN 600 MG PO TABS
600.0000 mg | ORAL_TABLET | Freq: Four times a day (QID) | ORAL | 0 refills | Status: DC
  Filled 2024-02-20: qty 30, 8d supply, fill #0

## 2024-02-20 MED ORDER — ETONOGESTREL 68 MG ~~LOC~~ IMPL
68.0000 mg | DRUG_IMPLANT | Freq: Once | SUBCUTANEOUS | Status: DC
  Filled 2024-02-20 (×2): qty 1

## 2024-02-20 MED ORDER — LIDOCAINE HCL 1 % IJ SOLN
0.0000 mL | Freq: Once | INTRAMUSCULAR | Status: DC | PRN
  Filled 2024-02-20: qty 20

## 2024-02-20 MED ORDER — COCONUT OIL OIL: 1.0000 | TOPICAL_OIL | Status: AC | PRN

## 2024-02-20 NOTE — Clinical Social Work Maternal (Signed)
 CLINICAL SOCIAL WORK MATERNAL/CHILD NOTE  Patient Details  Name: Amanda Russell MRN: [id number] Date of Birth: 08-25-1996  Date:  02/20/2024  Clinical Social Worker Initiating Note:  [place]bye [place], [place] Date/Time: Initiated:  02/20/24/1045     Child's Name:  Undecided   Biological Parents:  Mother   Need for Interpreter:  None   Reason for Referral:  Behavioral Health Concerns, Parental Support of Premature Babies < 32 weeks/or Critically Ill babies, Other (Comment), Current Substance Use/Substance Use During Pregnancy  , Current CPS Involvement (Limited Prenatal Care)   Address:  [address] [place] [address]    Phone number:  [phone] (home)     Additional phone number:   Household Members/Support Persons (HM/SP):       HM/SP Name Relationship DOB or Age  HM/SP -1        HM/SP -2        HM/SP -3        HM/SP -4        HM/SP -5        HM/SP -6        HM/SP -7        HM/SP -8          Natural Supports (not living in the home):  Immediate Family, Parent   Professional Supports: None   Employment: Environmental education officer   Type of Work: Heritage manager   Education:  [employer] school graduate   Homebound arranged:    Surveyor, quantity Resources:  Medicaid   Other Resources:  Sales executive  , WIC   Cultural/Religious Considerations Which May Impact Care:    Strengths:  Ability to meet basic needs  , Understanding of illness, Merchandiser, retail, Home prepared for child     Psychotropic Medications:         Pediatrician:    Armed forces operational officer area  Pediatrician List:   Pharmacist, hospital [facility] [employer])  [employer]    [facility]      Pediatrician Fax Number:    Risk Factors/Current Problems:  Substance Use  , Mental Health Concerns  , DHHS Involvement  , Compliance with Treatment     Cognitive State:  Able to Concentrate  , Alert  , Goal Oriented  , Linear  Thinking     Mood/Affect:  Calm  , Comfortable  , Interested  , Euthymic  , Relaxed     CSW Assessment: CSW met with MOB at bedside to complete psychosocial assessment. CSW introduced self and explained role. MOB was welcoming, pleasant, and remained engaged during assessment. MOB reported that she resides alone. 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 MOB reported having items needed to care for infant including a car seat and bassinet. CSW informed MOB about [facility] [employer] [facility]'s [employer] if any assistance is needed obtaining items for infant, MOB reported that a referral for all essential items would be helpful. CSW agreed to complete a referral. CSW inquired about MOB's support system, MOB reported that her mom and sister are supports.   CSW inquired about MOB's mental health history. MOB denied any mental health history. Per chart review, MOB has a history of ADHD, Bipolar, Depression, Psychosis, Anxiety, and ODD. MOB denied any history of postpartum depression. CSW inquired about how MOB was feeling emotionally since giving birth, MOB reported that she was feeling  fine and shared that she is feeling a little better now that she's no longer pregnant. MOB reported that she is taking it one day at a time. CSW acknowledged, normalized, and validated MOB's feelings. MOB presented calm and did not demonstrate any acute mental health signs/symptoms. CSW assessed for safety, MOB denied SI, HI, and domestic violence.   CSW provided education regarding the baby blues period vs. perinatal mood disorders, discussed treatment and gave resources for mental health follow up if concerns arise.  CSW recommends self-evaluation during the postpartum time period using the New Mom Checklist from Postpartum Progress and encouraged MOB to contact a medical professional if symptoms are noted at any time.    CSW provided review of Sudden Infant Death Syndrome  (SIDS) precautions.    CSW and MOB discussed infant's NICU admission. CSW informed MOB about the NICU, what to expect, and supports available while infant is admitted to the NICU. MOB reported that she feels well informed about infant's care and denied any transportation barriers with visiting infant in the NICU. MOB asked questions about visitation, CSW answered questions. MOB denied any additional questions/concerns.   CSW informed MOB about the hospital drug screen policy due to limited prenatal care. MOB reported that she recalls having 2 or 3 prenatal care visits. MOB reported that her only barrier to prenatal care was working all the time. MOB denied any substance use during pregnancy. CSW informed MOB that infant's UDS was positive for THC and a CPS report would be made. MOB endorsed CPS history. MOB reported having a current open case with [facility] CPS for her two year old. MOB did not elaborate on the status of the open case.   Per [facility] CPS Supervisor Gregary Lean Spinks), MOB has an open CPS case and MOB's children are not in her custody.   CSW made a [facility] CPS report due to infant's positive UDS for [facility] and MOB's current open CPS case. CSW awaiting return call from CPS.  Currently there are barriers to infant's discharge.    CSW Plan/Description:  Sudden Infant Death Syndrome (SIDS) Education, Perinatal Mood and Anxiety Disorder (PMADs) Education, Psychosocial Support and Ongoing Assessment of Needs, Hospital Drug Screen Policy Information, Child Protective Service Report  , CSW Will Continue to Monitor Umbilical Cord Tissue Drug Screen Results and Make Report if Warranted, CSW Awaiting CPS Disposition Plan, Other Information/Referral to [facility], Other Patient/Family Education    [place], LCSW 02/20/2024, 10:52 AM

## 2024-02-20 NOTE — Lactation Note (Signed)
 This note was copied from a baby's chart. Lactation Consultation Note  Patient Name: Amanda Russell [id number] Date: 02/20/2024 Age:27 hours   LC was told at 1000 by baby's RN that Mom had told her that she didn't intend on pumping or breastfeeding until baby was home.  When [facility] mentioned she needed to see Mom regardless, RN stated there was too much going on right now and to hold off until later.  LC attempted to visit with this Mom, but RN informed LC that Mom had departed the NICU as she was discharged.  Mom told RN that she planned to use canned formula.  RN provided Mom with lactation resources.  LC asked RN to call when Mom returns to NICU.   Dario Edison  RN IBCLC 02/20/2024, 1:35 PM

## 2024-02-20 NOTE — Progress Notes (Addendum)
 POSTPARTUM PROGRESS NOTE  Post Partum Day 1  Subjective:  Amanda Russell is a 28 y.o.  [id number] s/p SVD at 109w1d .  No acute events overnight.  Pt denies problems with ambulating, voiding or po intake.  She denies nausea or vomiting.  Pain is well controlled.  She has had flatus. She has not had bowel movement.  Lochia small   Objective: Blood pressure 114/72, pulse (!) 57, temperature 98.3 F (36.8 C), temperature source Oral, resp. rate 19, height 5\' 6"  (1.676 m), weight 99.3 kg, last menstrual period 08/03/2023, SpO2 100%.  Physical Exam:  General: alert, cooperative and no distress Chest: no respiratory distress Heart:regular rate, distal pulses intact Abdomen: soft, nontender,  Uterine Fundus: firm, appropriately tender DVT Evaluation: No calf swelling or tenderness Extremities: trace edema Skin: warm, dry  Recent Labs    08/18/23 0908 08/19/23 0856  HGB 13.3 14.0  HCT 40.4 42.2    Assessment/Plan: Amanda Russell is a 28 y.o. [id number] s/p SVD at [redacted]w[redacted]d  PPD#1 - Doing well Contraception: Nexplanon , will plan to place prior to D/C Feeding: Breastmilk and Formula Dispo: Plan for discharge 5/22-23.    Frutoso Jing, MD 10:54 AM  GME ATTESTATION:  Evaluation and management procedures were performed by the [facility] Medicine Resident under my supervision. I was immediately available for direct supervision, assistance and direction throughout this encounter.  I also confirm that I have verified the information documented in the resident's note, and that I have also personally reperformed the pertinent components of the physical exam and all of the medical decision making activities.  I have also made any necessary editorial changes.  Darrow End, MD OB Fellow, Faculty [facility], Center for [facility] 02/20/2024 11:55 AM

## 2024-02-21 LAB — SURGICAL PATHOLOGY

## 2024-02-23 ENCOUNTER — Ambulatory Visit (HOSPITAL_COMMUNITY): Payer: Self-pay

## 2024-02-23 NOTE — Lactation Note (Signed)
 This note was copied from a baby's chart. Lactation Consultation Note  Patient Name: Amanda Russell [id number] Date: 02/23/2024 Age:27 days   LC in to speak with Mom.  Mom re-stated her desire to formula feed baby "Amanda Russell".  Breasts are engorged and Mom was given steps to help with this.  Mom thought about pumping to release some of the fullness, but changed her mind.  LC recommended ice packs and supportive bra (which she had on), cabbage leaves, ibuprofen  for pain.   Mom very appreciative for help  Dario Edison RN [facility] 02/23/2024, 12:08 PM

## 2024-02-25 ENCOUNTER — Ambulatory Visit

## 2024-02-28 ENCOUNTER — Telehealth (HOSPITAL_COMMUNITY): Payer: Self-pay | Admitting: *Deleted

## 2024-02-28 NOTE — Telephone Encounter (Signed)
 02/28/2024  Name: Amanda Russell MRN: [id number] DOB: [birth year]  Reason for Call:  Transition of Care Hospital Discharge Call  Contact Status: Patient Contact Status: Complete  Language assistant needed: Interpreter Mode: Interpreter Not Needed        Follow-Up Questions: Do You Have Any Concerns About Your Health As You Heal From Delivery?: No Do You Have Any Concerns About Your Infants Health?: No  Edinburgh Postnatal Depression Scale:  In the Past 7 Days: I have been able to laugh and see the funny side of things.: As much as I always could I have looked forward with enjoyment to things.: As much as I ever did I have blamed myself unnecessarily when things went wrong.: No, never I have been anxious or worried for no good reason.: Hardly ever I have felt scared or panicky for no good reason.: No, not at all Things have been getting on top of me.: No, I have been coping as well as ever I have been so unhappy that I have had difficulty sleeping.: Not at all I have felt sad or miserable.: No, not at all I have been so unhappy that I have been crying.: No, never The thought of harming myself has occurred to me.: Never Edinburgh Postnatal Depression Scale Total: 1  PHQ2-9 Depression Scale:     Discharge Follow-up: Edinburgh score requires follow up?: No Patient was advised of the following resources:: Support Group  Post-discharge interventions: NA  Pearlie Bougie, RN 02/28/2024 14:43

## 2024-03-05 ENCOUNTER — Other Ambulatory Visit

## 2024-03-05 ENCOUNTER — Ambulatory Visit

## 2024-03-24 ENCOUNTER — Telehealth: Admitting: Family Medicine

## 2024-03-24 ENCOUNTER — Ambulatory Visit
Admission: RE | Admit: 2024-03-24 | Discharge: 2024-03-24 | Disposition: A | Discharge status: Home / Self Care | Payer: Self-pay | Source: Ambulatory Visit | Attending: Family Medicine | Admitting: Family Medicine

## 2024-03-24 ENCOUNTER — Other Ambulatory Visit: Payer: Self-pay

## 2024-03-24 VITALS — BP 121/85 | HR 99 | Temp 99.2°F | Resp 18

## 2024-03-24 DIAGNOSIS — R070 Pain in throat: Secondary | ICD-10-CM | POA: Diagnosis not present

## 2024-03-24 LAB — POCT RAPID STREP A (OFFICE): Rapid Strep A Screen: NEGATIVE

## 2024-03-24 MED ORDER — NAPROXEN 500 MG PO TABS: 500.0000 mg | ORAL_TABLET | Freq: Two times a day (BID) | ORAL | 0 refills | Status: AC | PRN

## 2024-03-24 NOTE — Discharge Instructions (Signed)
 Your strep test is negative.  Culture of the throat will be sent, and staff will notify you if that is in turn positive.  Staff will notify you if there is anything positive on the other throat swab.  Take naproxen  500 mg--1 tablet every 12 hours as needed for pain

## 2024-03-24 NOTE — ED Provider Notes (Signed)
 [name]    CSN: [id number] Arrival date & time: 03/24/24  1102      History   Chief Complaint Chief Complaint  Patient presents with   Sore Throat    Painful after oral sex . - Entered by patient    HPI Amanda Russell is a 28 y.o. female.    Sore Throat  Here for sore throat this been going on since about June 21.  A day or 2 before she had had oral intercourse.  No fever or chills and no cough or nasal congestion.  NKDA  She did deliver a baby about 1 month ago. She is not breast-feeding.   Past Medical History:  Diagnosis Date   Carrier of group B Streptococcus 03/05/2023   + in urine   Chlamydia    Chlamydial infection 03/05/2023   History of preterm premature rupture of membranes 03/05/2023   32w. Per MFM, nightly progesterone   Hypertension 2022   after delivery   Intestinal infection by trichomonas vaginalis 03/05/2023   Left tubal pregnancy 10/16/2019   PID (acute pelvic inflammatory disease)    Pregnancy 03/28/2020   Preterm premature rupture of membranes 09/09/2021   Preterm premature rupture of membranes (PPROM) with unknown onset of labor 07/13/2014   Suicidal ideation     Patient Active Problem List   Diagnosis Date Noted   Preterm labor 02/20/2024   Supervision of high risk pregnancy, antepartum 01/02/2024   No prenatal care in current pregnancy in second trimester 12/18/2023   Subchorionic hematoma in first trimester 09/01/2023   Hx Preterm premature rupture of membranes 09/09/2021    Past Surgical History:  Procedure Laterality Date   LAPAROSCOPY Left 10/16/2019   Procedure: LAPAROSCOPIC LEFT SALPINGECTOMY;  Surgeon: Izell Harari, MD;  Location: [facility];  Service: Gynecology;  Laterality: Left;    OB History     Gravida  6   Para  5   Term  2   Preterm  3   AB  1   Living  5      SAB      IAB      Ectopic  1   Multiple  0   Live Births  5            Home Medications    Prior to  Admission medications   Medication Sig Start Date End Date Taking? Authorizing Provider  naproxen  (NAPROSYN ) 500 MG tablet Take 1 tablet (500 mg total) by mouth 2 (two) times daily as needed (pain). 03/24/24  Yes Vonna Sharlet POUR, MD  acetaminophen  (TYLENOL ) 325 MG tablet Take 2 tablets (650 mg total) by mouth every 4 (four) hours as needed (for pain scale < 4). 02/20/24   Leveque, Alyssa, MD  coconut oil OIL Apply 1 Application topically as needed. 02/20/24   Leveque, Alyssa, MD  Prenatal Vit-Fe Fumarate-FA (MULTIVITAMIN-PRENATAL) 27-0.8 MG TABS tablet Take 1 tablet by mouth daily at 12 noon.    [provider]    Family History Family History  Problem Relation Age of Onset   Hypertension Mother    Other Father        MVA   Hypertension Other    Cancer Neg Hx    Diabetes Neg Hx    Asthma Neg Hx    Heart disease Neg Hx     Social History Social History   Tobacco Use   Smoking status: Former    Current packs/day: 0.00    Average packs/day:  0.5 packs/day for 0.8 years (0.4 ttl pk-yrs)    Types: Cigarettes    Start date: 03/21/2010    Quit date: 01/07/2011    Years since quitting: 13.2   Smokeless tobacco: Never  Vaping Use   Vaping status: Former  Substance Use Topics   Alcohol use: Not Currently    Comment: socially   Drug use: Not Currently    Frequency: 4.0 times per week    Types: Marijuana    Comment: last use 2022     Allergies   Pollen extract   Review of Systems Review of Systems   Physical Exam Triage Vital Signs ED Triage Vitals [03/24/24 1118]  Encounter Vitals Group     BP 121/85     Girls Systolic BP Percentile      Girls Diastolic BP Percentile      Boys Systolic BP Percentile      Boys Diastolic BP Percentile      Pulse Rate 99     Resp 18     Temp 99.2 F (37.3 C)     Temp Source Oral     SpO2 97 %     Weight      Height      Head Circumference      Peak Flow      Pain Score 5     Pain Loc      Pain Education      Exclude  from Growth Chart    No data found.  Updated Vital Signs BP 121/85 (BP Location: Left Arm)   Pulse 99   Temp 99.2 F (37.3 C) (Oral)   Resp 18   LMP 06/02/2023 (Approximate)   SpO2 97%   Visual Acuity Right Eye Distance:   Left Eye Distance:   Bilateral Distance:    Right Eye Near:   Left Eye Near:    Bilateral Near:     Physical Exam Vitals reviewed.  Constitutional:      General: She is not in acute distress.    Appearance: She is not ill-appearing, toxic-appearing or diaphoretic.  HENT:     Mouth/Throat:     Mouth: Mucous membranes are moist.     Comments: There is redness of the posterior OP and clear exudate    Cardiovascular:     Rate and Rhythm: Normal rate and regular rhythm.     Heart sounds: No murmur heard. Pulmonary:     Effort: Pulmonary effort is normal.     Breath sounds: Normal breath sounds.   Skin:    Coloration: Skin is not jaundiced or pale.   Neurological:     General: No focal deficit present.     Mental Status: She is alert and oriented to person, place, and time.   Psychiatric:        Behavior: Behavior normal.      UC Treatments / Results  Labs (all labs ordered are listed, but only abnormal results are displayed) Labs Reviewed  POCT RAPID STREP A (OFFICE) - Normal  CULTURE, GROUP A STREP ([facility])  CYTOLOGY, (ORAL, ANAL, URETHRAL) ANCILLARY ONLY    EKG   Radiology No results found.  Procedures Procedures (including critical care time)  Medications Ordered in UC Medications - No data to display  Initial Impression / Assessment and Plan / UC Course  I have reviewed the triage vital signs and the nursing notes.  Pertinent labs & imaging results that were available during my care of the patient were reviewed  by me and considered in my medical decision making (see chart for details).     Rapid strep is negative.  Throat culture is notify her and treat protocol if positive  Cytology swab was done of her oropharynx  also.  Staff will notify her if that is positive and treat her call.  Naproxen  is sent in for the throat pain. Final Clinical Impressions(s) / UC Diagnoses   Final diagnoses:  Throat pain     Discharge Instructions      Your strep test is negative.  Culture of the throat will be sent, and staff will notify you if that is in turn positive.  Staff will notify you if there is anything positive on the other throat swab.  Take naproxen  500 mg--1 tablet every 12 hours as needed for pain        ED Prescriptions     Medication Sig Dispense Auth. Provider   naproxen  (NAPROSYN ) 500 MG tablet Take 1 tablet (500 mg total) by mouth 2 (two) times daily as needed (pain). 10 tablet Vonna Nyko Gell K, MD      PDMP not reviewed this encounter.   Vonna Sharlet POUR, MD 03/24/24 1146

## 2024-03-24 NOTE — ED Triage Notes (Signed)
 Pt here for sore throat since having oral sex 4 days ago; pt is recent post partum

## 2024-03-24 NOTE — Progress Notes (Signed)
 The patient no-showed for appointment despite this provider sending direct link, reaching out via phone with no response and waiting for at least 10 minutes from appointment time for patient to join. They will be marked as a NS for this appointment/time.   Freddy Finner, NP

## 2024-03-25 LAB — CYTOLOGY, (ORAL, ANAL, URETHRAL) ANCILLARY ONLY
Chlamydia: NEGATIVE
Comment: NEGATIVE
Comment: NORMAL
Neisseria Gonorrhea: NEGATIVE

## 2024-03-26 ENCOUNTER — Telehealth: Admitting: Physician Assistant

## 2024-03-26 DIAGNOSIS — B9689 Other specified bacterial agents as the cause of diseases classified elsewhere: Secondary | ICD-10-CM | POA: Diagnosis not present

## 2024-03-26 DIAGNOSIS — J028 Acute pharyngitis due to other specified organisms: Secondary | ICD-10-CM

## 2024-03-26 LAB — CULTURE, GROUP A STREP (THRC)

## 2024-03-26 MED ORDER — AMOXICILLIN 500 MG PO CAPS: 500.0000 mg | ORAL_CAPSULE | Freq: Two times a day (BID) | ORAL | 0 refills | Status: AC

## 2024-03-26 NOTE — Progress Notes (Signed)
 Virtual Visit Consent   Amanda Russell, you are scheduled for a virtual visit with a [facility] provider today. Just as with appointments in the office, your consent must be obtained to participate. Your consent will be active for this visit and any virtual visit you may have with one of our providers in the next 365 days. If you have a MyChart account, a copy of this consent can be sent to you electronically.  As this is a virtual visit, video technology does not allow for your provider to perform a traditional examination. This may limit your provider's ability to fully assess your condition. If your provider identifies any concerns that need to be evaluated in person or the need to arrange testing (such as labs, EKG, etc.), we will make arrangements to do so. Although advances in technology are sophisticated, we cannot ensure that it will always work on either your end or our end. If the connection with a video visit is poor, the visit may have to be switched to a telephone visit. With either a video or telephone visit, we are not always able to ensure that we have a secure connection.  By engaging in this virtual visit, you consent to the provision of healthcare and authorize for your insurance to be billed (if applicable) for the services provided during this visit. Depending on your insurance coverage, you may receive a charge related to this service.  I need to obtain your verbal consent now. Are you willing to proceed with your visit today? Amanda Russell has provided verbal consent on 03/26/2024 for a virtual visit (video or telephone). Delon CHRISTELLA Dickinson, PA-C  Date: 03/26/2024 10:27 AM   Virtual Visit via Video Note   I, Delon CHRISTELLA Dickinson, connected with  Amanda Russell  ([id number], [birth year]) on 03/26/24 at 10:15 AM EDT by a video-enabled telemedicine application and verified that I am speaking with the correct person using two identifiers.  Location: Patient: Virtual Visit  Location Patient: Home Provider: Virtual Visit Location Provider: Home Office   I discussed the limitations of evaluation and management by telemedicine and the availability of in person appointments. The patient expressed understanding and agreed to proceed.    History of Present Illness: Amanda Russell is a 28 y.o. who identifies as a female who was assigned female at birth, and is being seen today for sore throat.  HPI: Sore Throat  This is a new problem. The current episode started in the past 7 days (Had oral sex on 03/21/24, pain started then; Seen at in-person UC on 03/24/24; Had negative rapid strep and negative throat cytology for gonorrhea and chlamydia; Throat culture for strep still pending; pain has worsened). The problem has been gradually worsening. There has been no fever. The pain is moderate. Associated symptoms include ear pain (when swallowing), headaches, swollen glands and trouble swallowing. Pertinent negatives include no abdominal pain, congestion, coughing, ear discharge, plugged ear sensation or shortness of breath. Associated symptoms comments: Reports throat is erythematous and swollen. She has tried gargles (salt water  gargles, naproxen ) for the symptoms. The treatment provided no relief.     Problems:  Patient Active Problem List   Diagnosis Date Noted   Preterm labor 02/20/2024   Supervision of high risk pregnancy, antepartum 01/02/2024   No prenatal care in current pregnancy in second trimester 12/18/2023   Subchorionic hematoma in first trimester 09/01/2023   Hx Preterm premature rupture of membranes 09/09/2021    Allergies:  Allergies  Allergen  Reactions   Pollen Extract Itching and Cough   Medications:  Current Outpatient Medications:    amoxicillin  (AMOXIL ) 500 MG capsule, Take 1 capsule (500 mg total) by mouth 2 (two) times daily for 10 days., Disp: 20 capsule, Rfl: 0   acetaminophen  (TYLENOL ) 325 MG tablet, Take 2 tablets (650 mg total) by mouth  every 4 (four) hours as needed (for pain scale < 4)., Disp: , Rfl:    coconut oil OIL, Apply 1 Application topically as needed., Disp: , Rfl:    naproxen  (NAPROSYN ) 500 MG tablet, Take 1 tablet (500 mg total) by mouth 2 (two) times daily as needed (pain)., Disp: 10 tablet, Rfl: 0   Prenatal Vit-Fe Fumarate-FA (MULTIVITAMIN-PRENATAL) 27-0.8 MG TABS tablet, Take 1 tablet by mouth daily at 12 noon., Disp: , Rfl:   Observations/Objective: Patient is well-developed, well-nourished in no acute distress.  Resting comfortably at home.  Head is normocephalic, atraumatic.  No labored breathing.  Speech is clear and coherent with logical content.  Patient is alert and oriented at baseline.    Assessment and Plan: 1. Acute bacterial pharyngitis (Primary) - amoxicillin  (AMOXIL ) 500 MG capsule; Take 1 capsule (500 mg total) by mouth 2 (two) times daily for 10 days.  Dispense: 20 capsule; Refill: 0  - Suspect strep throat - Amoxicillin  prescribed - Tylenol  and Ibuprofen  alternating every 4 hours - Salt water  gargles - Chloraseptic spray - Liquid and soft food diet - Push fluids - New toothbrush in 3 days - Seek in person evaluation if not improving or if symptoms worsen   Follow Up Instructions: I discussed the assessment and treatment plan with the patient. The patient was provided an opportunity to ask questions and all were answered. The patient agreed with the plan and demonstrated an understanding of the instructions.  A copy of instructions were sent to the patient via MyChart unless otherwise noted below.    The patient was advised to call back or seek an in-person evaluation if the symptoms worsen or if the condition fails to improve as anticipated.    Delon CHRISTELLA Dickinson, PA-C

## 2024-03-26 NOTE — Patient Instructions (Signed)
 Amanda Russell, thank you for joining Amanda CHRISTELLA Dickinson, PA-C for today's virtual visit.  While this provider is not your primary care provider (PCP), if your PCP is located in our provider database this encounter information will be shared with them immediately following your visit.   A [facility] MyChart account gives you access to today's visit and all your visits, tests, and labs performed at [facility]  click here if you don't have a [facility] MyChart account or go to [email or web address]  Consent: (Patient) Amanda Russell provided verbal consent for this virtual visit at the beginning of the encounter.  Current Medications:  Current Outpatient Medications:    amoxicillin  (AMOXIL ) 500 MG capsule, Take 1 capsule (500 mg total) by mouth 2 (two) times daily for 10 days., Disp: 20 capsule, Rfl: 0   acetaminophen  (TYLENOL ) 325 MG tablet, Take 2 tablets (650 mg total) by mouth every 4 (four) hours as needed (for pain scale < 4)., Disp: , Rfl:    coconut oil OIL, Apply 1 Application topically as needed., Disp: , Rfl:    naproxen  (NAPROSYN ) 500 MG tablet, Take 1 tablet (500 mg total) by mouth 2 (two) times daily as needed (pain)., Disp: 10 tablet, Rfl: 0   Prenatal Vit-Fe Fumarate-FA (MULTIVITAMIN-PRENATAL) 27-0.8 MG TABS tablet, Take 1 tablet by mouth daily at 12 noon., Disp: , Rfl:    Medications ordered in this encounter:  Meds ordered this encounter  Medications   amoxicillin  (AMOXIL ) 500 MG capsule    Sig: Take 1 capsule (500 mg total) by mouth 2 (two) times daily for 10 days.    Dispense:  20 capsule    Refill:  0    Supervising Provider:   BLAISE ALEENE KIDD [[id number]]     *If you need refills on other medications prior to your next appointment, please contact your pharmacy*  Follow-Up: Call back or seek an in-person evaluation if the symptoms worsen or if the condition fails to improve as anticipated.  [facility] [phone]  Other  Instructions Strep Throat, Adult Strep throat is an infection in the throat that is caused by bacteria. It is common during the cold months of the year. It mostly affects children who are 31-79 years old. However, people of all ages can get it at any time of the year. This infection spreads from person to person (is contagious) through coughing, sneezing, or having close contact. Your health care provider may use other names to describe the infection. When strep throat affects the tonsils, it is called tonsillitis. When it affects the back of the throat, it is called pharyngitis. What are the causes? This condition is caused by the Streptococcus pyogenes bacteria. What increases the risk? You are more likely to develop this condition if: You care for school-age children, or are around school-age children. Children are more likely to get strep throat and may spread it to others. You spend time in crowded places where the infection can spread easily. You have close contact with someone who has strep throat. What are the signs or symptoms? Symptoms of this condition include: Fever or chills. Redness, swelling, or pain in the tonsils or throat. Pain or difficulty when swallowing. White or yellow spots on the tonsils or throat. Tender glands in the neck and under the jaw. Bad smelling breath. Red rash all over the body. This is rare. How is this diagnosed? This condition is diagnosed by tests that check for the presence and the amount  of bacteria that cause strep throat. They are: Rapid strep test. Your throat is swabbed and checked for the presence of bacteria. Results are usually ready in minutes. Throat culture test. Your throat is swabbed. The sample is placed in a cup that allows infections to grow. Results are usually ready in 1 or 2 days. How is this treated? This condition may be treated with: Medicines that kill germs (antibiotics). Medicines that relieve pain or fever. These  include: Ibuprofen  or acetaminophen . Aspirin, only for people who are over the age of 70. Throat lozenges. Throat sprays. Follow these instructions at home: Medicines  Take over-the-counter and prescription medicines only as told by your health care provider. Take your antibiotic medicine as told by your health care provider. Do not stop taking the antibiotic even if you start to feel better. Eating and drinking  If you have trouble swallowing, try eating soft foods until your sore throat feels better. Drink enough fluid to keep your urine pale yellow. To help relieve pain, you may have: Warm fluids, such as soup and tea. Cold fluids, such as frozen desserts or popsicles. General instructions Gargle with a salt-water  mixture 3-4 times a day or as needed. To make a salt-water  mixture, completely dissolve -1 tsp (3-6 g) of salt in 1 cup (237 mL) of warm water . Get plenty of rest. Stay home from work or school until you have been taking antibiotics for 24 hours. Do not use any products that contain nicotine  or tobacco. These products include cigarettes, chewing tobacco, and vaping devices, such as e-cigarettes. If you need help quitting, ask your health care provider. It is up to you to get your test results. Ask your health care provider, or the department that is doing the test, when your results will be ready. Keep all follow-up visits. This is important. How is this prevented?  Do not share food, drinking cups, or personal items that could cause the infection to spread to other people. Wash your hands often with soap and water  for at least 20 seconds. If soap and water  are not available, use hand sanitizer. Make sure that all people in your house wash their hands well. Have family members tested if they have a sore throat or fever. They may need an antibiotic if they have strep throat. Contact a health care provider if: You have swelling in your neck that keeps getting bigger. You  develop a rash, cough, or earache. You cough up a thick mucus that is green, yellow-[place], or bloody. You have pain or discomfort that does not get better with medicine. Your symptoms seem to be getting worse. You have a fever. Get help right away if: You have new symptoms, such as vomiting, severe headache, stiff or painful neck, chest pain, or shortness of breath. You have severe throat pain, drooling, or changes in your voice. You have swelling of the neck, or the skin on the neck becomes red and tender. You have signs of dehydration, such as tiredness (fatigue), dry mouth, and decreased urination. You become increasingly sleepy, or you cannot wake up completely. Your joints become red or painful. These symptoms may represent a serious problem that is an emergency. Do not wait to see if the symptoms will go away. Get medical help right away. Call your local emergency services (911 in the [place] Do not drive yourself to the hospital. Summary Strep throat is an infection in the throat that is caused by the Streptococcus pyogenes bacteria. This infection is spread from  person to person (is contagious) through coughing, sneezing, or having close contact. Take your medicines, including antibiotics, as told by your health care provider. Do not stop taking the antibiotic even if you start to feel better. To prevent the spread of germs, wash your hands well with soap and water . Have others do the same. Do not share food, drinking cups, or personal items. Get help right away if you have new symptoms, such as vomiting, severe headache, stiff or painful neck, chest pain, or shortness of breath. This information is not intended to replace advice given to you by your health care provider. Make sure you discuss any questions you have with your health care provider. Document Revised: 01/10/2021 Document Reviewed: 01/10/2021 Elsevier Patient Education  2024 Elsevier Inc.   If you have been instructed to  have an in-person evaluation today at a local Urgent Care facility, please use the link below. It will take you to a list of all of our available [facility], including address, phone number and hours of operation. Please do not delay care.  [facility]  If you or a family member do not have a primary care provider, use the link below to schedule a visit and establish care. When you choose a [facility] primary care physician or advanced practice provider, you gain a long-term partner in health. Find a Primary Care Provider  Learn more about [facility]'s in-office and virtual care options: [facility] - Get Care Now

## 2024-03-31 ENCOUNTER — Ambulatory Visit: Admitting: Obstetrics & Gynecology

## 2024-03-31 ENCOUNTER — Encounter: Payer: Self-pay | Admitting: Obstetrics & Gynecology

## 2024-03-31 ENCOUNTER — Other Ambulatory Visit: Payer: Self-pay

## 2024-03-31 DIAGNOSIS — Z3202 Encounter for pregnancy test, result negative: Secondary | ICD-10-CM | POA: Diagnosis not present

## 2024-03-31 DIAGNOSIS — Z30017 Encounter for initial prescription of implantable subdermal contraceptive: Secondary | ICD-10-CM | POA: Diagnosis not present

## 2024-03-31 NOTE — Progress Notes (Unsigned)
    Post Partum Visit Note  Amanda Russell is a 28 y.o. [phone] female who presents for a postpartum visit. She is 5 weeks postpartum following a normal spontaneous vaginal delivery.  I have fully reviewed the prenatal and intrapartum course. The delivery was at 34 gestational weeks.  Anesthesia: none. Postpartum course has been good. Baby is doing well. Baby is feeding by bottle - Similac Neosure. Bleeding red; changes pad about every two hours for comfort but the pad is not saturated at that point. Bowel function is normal. Bladder function is normal. Patient is sexually active; used a condom. Contraception method is Nexplanon . Postpartum depression screening: negative.   The pregnancy intention screening data noted above was reviewed. Potential methods of contraception were discussed. The patient elected to proceed with No data recorded.    Health Maintenance Due  Topic Date Due   COVID-19 Vaccine (1) Never done   Hepatitis B Vaccines (1 of 3 - 19+ 3-dose series) Never done   HPV VACCINES (1 - Risk 3-dose SCDM series) Never done    The following portions of the patient's history were reviewed and updated as appropriate: allergies, current medications, past family history, past medical history, past social history, past surgical history, and problem list.  Review of Systems Pertinent items are noted in HPI.  Objective:  LMP 06/02/2023 (Approximate)    General:  alert, cooperative, and no distress   Breasts:  not indicated  Lungs:   Heart:    Abdomen: soft, non-tender; bowel sounds normal; no masses,  no organomegaly   Wound   GU exam:  not indicated       Assessment:    There are no diagnoses linked to this encounter.  normal postpartum exam.   Plan:   Essential components of care per ACOG recommendations:  1.  Mood and well being: Patient with negative depression screening today. Reviewed local resources for support.  - Patient tobacco use? No.   - hx of drug use?  No.    2. Infant care and feeding:  -Patient currently breastmilk feeding? No.  -Social determinants of health (SDOH) reviewed in EPIC. No concerns  3. Sexuality, contraception and birth spacing - Patient does not want a pregnancy in the next year.  Desired family size is 5 children.  - Reviewed reproductive life planning. Reviewed contraceptive methods based on pt preferences and effectiveness.  Patient desired Hormonal Implant today.   - Discussed birth spacing of 18 months  4. Sleep and fatigue -Encouraged family/partner/community support of 4 hrs of uninterrupted sleep to help with mood and fatigue  5. Physical Recovery  - Discussed patients delivery and complications. She describes her labor as good. - Patient had a Vaginal, no problems at delivery. Patient had no laceration. Perineal healing reviewed. Patient expressed understanding - Patient has urinary incontinence? No. - Patient is safe to resume physical and sexual activity  6.  Health Maintenance - HM due items addressed Yes - Last pap smear  Diagnosis  Date Value Ref Range Status  10/11/2021   Final   - Negative for intraepithelial lesion or malignancy (NILM)   Pap smear not done at today's visit.  -Breast Cancer screening indicated? No.   7. Chronic Disease/Pregnancy Condition follow up: None  - PCP follow up  Eveline Lynwood MATSU, MD Center for [facility], [facility]

## 2024-03-31 NOTE — Progress Notes (Unsigned)
 GYNECOLOGY OFFICE PROCEDURE NOTE  Amanda Russell is a 28 y.o. [id number] here for Nexplanon  removal*** Nexplanon  insertion.   Nexplanon  Insertion Procedure Patient identified, informed consent performed, consent signed.   Patient does understand that irregular bleeding is a very common side effect of this medication. She was advised to have backup contraception for one week after placement. Pregnancy test in clinic today was negative.  Appropriate time out taken.  Patient's left arm was prepped and draped in the usual sterile fashion. The ruler used to measure and mark insertion area.  Patient was prepped with alcohol swab and then injected with 3 ml of 1% lidocaine .  She was prepped with betadine, Nexplanon  removed from packaging,  Device confirmed in needle, then inserted full length of needle and withdrawn per handbook instructions. Nexplanon  was able to palpated in the patient's arm; patient palpated the insert herself. There was minimal blood loss.  Patient insertion site covered with gauze and a pressure bandage to reduce any bruising.  The patient tolerated the procedure well and was given post procedure instructions.    Eveline Lynwood MATSU, MD Attending Obstetrician & Gynecologist, [facility] and Center for [facility]  03/31/2024

## 2024-04-01 LAB — POCT PREGNANCY, URINE: Preg Test, Ur: NEGATIVE

## 2024-04-30 DIAGNOSIS — Z30017 Encounter for initial prescription of implantable subdermal contraceptive: Secondary | ICD-10-CM | POA: Diagnosis not present

## 2024-04-30 MED ORDER — ETONOGESTREL 68 MG ~~LOC~~ IMPL
68.0000 mg | DRUG_IMPLANT | Freq: Once | SUBCUTANEOUS | Status: AC
  Administered 2024-04-30: 68 mg via SUBCUTANEOUS

## 2024-04-30 NOTE — Addendum Note (Signed)
 Addended by: ELAINE ROSINA SAILOR on: 04/30/2024 10:39 AM   Modules accepted: Orders

## 2024-08-25 ENCOUNTER — Ambulatory Visit: Admitting: Obstetrics and Gynecology

## 2024-10-13 ENCOUNTER — Other Ambulatory Visit (HOSPITAL_BASED_OUTPATIENT_CLINIC_OR_DEPARTMENT_OTHER): Payer: Self-pay

## 2024-10-15 ENCOUNTER — Ambulatory Visit: Admitting: Obstetrics and Gynecology
